# Patient Record
Sex: Male | Born: 1935 | Race: White | Hispanic: No | Marital: Married | Smoking: Former smoker
Health system: Southern US, Community
[De-identification: ages and names within clinical notes are randomized; demographics above are authoritative.]

## PROBLEM LIST (undated history)

## (undated) DIAGNOSIS — R918 Other nonspecific abnormal finding of lung field: Secondary | ICD-10-CM

## (undated) DIAGNOSIS — I4891 Unspecified atrial fibrillation: Secondary | ICD-10-CM

## (undated) DIAGNOSIS — J189 Pneumonia, unspecified organism: Secondary | ICD-10-CM

## (undated) DIAGNOSIS — I499 Cardiac arrhythmia, unspecified: Secondary | ICD-10-CM

## (undated) DIAGNOSIS — E785 Hyperlipidemia, unspecified: Secondary | ICD-10-CM

## (undated) DIAGNOSIS — I10 Essential (primary) hypertension: Secondary | ICD-10-CM

## (undated) DIAGNOSIS — C4490 Unspecified malignant neoplasm of skin, unspecified: Secondary | ICD-10-CM

## (undated) DIAGNOSIS — I350 Nonrheumatic aortic (valve) stenosis: Secondary | ICD-10-CM

## (undated) DIAGNOSIS — M4800 Spinal stenosis, site unspecified: Secondary | ICD-10-CM

## (undated) DIAGNOSIS — K219 Gastro-esophageal reflux disease without esophagitis: Secondary | ICD-10-CM

## (undated) DIAGNOSIS — I429 Cardiomyopathy, unspecified: Secondary | ICD-10-CM

## (undated) DIAGNOSIS — H3530 Unspecified macular degeneration: Secondary | ICD-10-CM

## (undated) DIAGNOSIS — N200 Calculus of kidney: Secondary | ICD-10-CM

## (undated) DIAGNOSIS — H269 Unspecified cataract: Secondary | ICD-10-CM

## (undated) DIAGNOSIS — I639 Cerebral infarction, unspecified: Secondary | ICD-10-CM

## (undated) DIAGNOSIS — D510 Vitamin B12 deficiency anemia due to intrinsic factor deficiency: Secondary | ICD-10-CM

## (undated) DIAGNOSIS — H9190 Unspecified hearing loss, unspecified ear: Secondary | ICD-10-CM

## (undated) DIAGNOSIS — C61 Malignant neoplasm of prostate: Secondary | ICD-10-CM

## (undated) HISTORY — DX: Unspecified atrial fibrillation: I48.91

## (undated) HISTORY — PX: CATARACT EXTRACTION: SUR2

## (undated) HISTORY — DX: Hyperlipidemia, unspecified: E78.5

## (undated) HISTORY — DX: Essential (primary) hypertension: I10

## (undated) HISTORY — DX: Vitamin B12 deficiency anemia due to intrinsic factor deficiency: D51.0

## (undated) HISTORY — DX: Unspecified macular degeneration: H35.30

## (undated) HISTORY — DX: Gastro-esophageal reflux disease without esophagitis: K21.9

## (undated) HISTORY — DX: Calculus of kidney: N20.0

## (undated) HISTORY — DX: Other nonspecific abnormal finding of lung field: R91.8

## (undated) HISTORY — DX: Malignant neoplasm of prostate: C61

## (undated) HISTORY — DX: Cardiomyopathy, unspecified: I42.9

## (undated) HISTORY — DX: Unspecified malignant neoplasm of skin, unspecified: C44.90

## (undated) HISTORY — DX: Spinal stenosis, site unspecified: M48.00

## (undated) HISTORY — DX: Cerebral infarction, unspecified: I63.9

## (undated) HISTORY — DX: Unspecified hearing loss, unspecified ear: H91.90

## (undated) HISTORY — DX: Nonrheumatic aortic (valve) stenosis: I35.0

## (undated) HISTORY — DX: Pneumonia, unspecified organism: J18.9

## (undated) HISTORY — PX: OTHER SURGICAL HISTORY: SHX169

## (undated) HISTORY — DX: Unspecified cataract: H26.9

---
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
## 1992-10-12 HISTORY — PX: CIRCUMCISION: SUR203

## 2001-07-04 ENCOUNTER — Encounter: Payer: Self-pay | Admitting: Ophthalmology

## 2001-07-04 ENCOUNTER — Encounter (INDEPENDENT_AMBULATORY_CARE_PROVIDER_SITE_OTHER): Payer: Self-pay | Admitting: *Deleted

## 2001-07-04 ENCOUNTER — Ambulatory Visit (HOSPITAL_COMMUNITY): Admission: RE | Admit: 2001-07-04 | Discharge: 2001-07-05 | Payer: Self-pay | Admitting: Ophthalmology

## 2001-10-12 DIAGNOSIS — I639 Cerebral infarction, unspecified: Secondary | ICD-10-CM

## 2001-10-12 HISTORY — DX: Cerebral infarction, unspecified: I63.9

## 2004-07-14 ENCOUNTER — Ambulatory Visit: Payer: Self-pay | Admitting: Gerontology

## 2004-10-07 ENCOUNTER — Emergency Department (HOSPITAL_COMMUNITY): Admission: EM | Admit: 2004-10-07 | Discharge: 2004-10-07 | Payer: Self-pay | Admitting: Emergency Medicine

## 2005-10-01 ENCOUNTER — Ambulatory Visit: Payer: Self-pay | Admitting: Gastroenterology

## 2005-10-12 DIAGNOSIS — C61 Malignant neoplasm of prostate: Secondary | ICD-10-CM

## 2005-10-12 HISTORY — DX: Malignant neoplasm of prostate: C61

## 2005-10-12 HISTORY — PX: TRANSURETHRAL RESECTION OF PROSTATE: SHX73

## 2006-09-16 ENCOUNTER — Ambulatory Visit: Payer: Self-pay | Admitting: Urology

## 2006-09-17 ENCOUNTER — Ambulatory Visit: Payer: Self-pay | Admitting: Urology

## 2006-12-15 ENCOUNTER — Ambulatory Visit: Payer: Self-pay

## 2007-06-08 ENCOUNTER — Ambulatory Visit: Payer: Self-pay | Admitting: Unknown Physician Specialty

## 2007-10-04 ENCOUNTER — Inpatient Hospital Stay: Payer: Self-pay | Admitting: Internal Medicine

## 2007-10-04 ENCOUNTER — Other Ambulatory Visit: Payer: Self-pay

## 2007-10-12 ENCOUNTER — Ambulatory Visit: Payer: Self-pay | Admitting: Physician Assistant

## 2008-02-10 ENCOUNTER — Ambulatory Visit: Payer: Self-pay | Admitting: Internal Medicine

## 2008-02-17 ENCOUNTER — Ambulatory Visit: Payer: Self-pay | Admitting: *Deleted

## 2008-02-17 ENCOUNTER — Ambulatory Visit (HOSPITAL_COMMUNITY): Admission: RE | Admit: 2008-02-17 | Discharge: 2008-02-17 | Payer: Self-pay | Admitting: Neurosurgery

## 2008-02-17 ENCOUNTER — Encounter (INDEPENDENT_AMBULATORY_CARE_PROVIDER_SITE_OTHER): Payer: Self-pay | Admitting: Neurosurgery

## 2008-11-14 ENCOUNTER — Ambulatory Visit: Payer: Self-pay | Admitting: Unknown Physician Specialty

## 2011-02-27 NOTE — Op Note (Signed)
[facility]  Patient:    Matthew Hicks, Matthew Hicks Visit Number: [id number] MRN: [id number]          Service Type: DSU Location: RCRM 2550 09 Attending Physician:  Ladona Ridgel Proc. Date: 07/04/01 Admit Date:  07/04/2001                             Operative Report  PREOPERATIVE DIAGNOSIS:  Subfoveal corneovascular membrane left eye.  POSTOPERATIVE DIAGNOSIS:  Subfoveal corneovascular membrane left eye.  PROCEDURE:  Pars plana vitrectomy, removal of subfoveal corneovascular membrane through small retinotomy, laser for retinal break, gas/fluid exchange with air left eye.  SURGEON:  Ladona Ridgel, M.D.  ASSISTANT:  Ulysees Barns, LPN  ANESTHESIA:  General endotracheal anesthesia.  ESTIMATED BLOOD LOSS:  Less than 1 cc.  COMPLICATIONS:  None.  DESCRIPTION OF PROCEDURE:  The patient was taken to the operating room and after the induction of general anesthesia, the left eye was prepped and draped in the usual sterile fashion.  Lid speculum was introduced and conjunctival peritomy was developed temporally and superonasally with relaxing incisions at 11 and 3.  Hemostasis obtained with razor cautery.  Sclerotomies were fashioned at 3.75 mm posterior to the limbus at 1:30, 10:30, and 4:30.  The superior sclerotomies were plugged on 4 mm infusion cannula secured at the 4:30 sclerotomy with a temporary suture of 7-0 Vicryl.  The tip was visually inspected and found to be in good position.  The Landers ring was then secured with a 7-0 Vicryl sutures at 3 and 9.  The plugs were removed and 30 degree prismatic lens applied to the surface of the eye.  A central core followed by a peripheral vitrectomy was performed.  There was posterior hyaloid separation. The magnifying flat lens was applied to the surface of the eye.  A large elevated subretinal fibrotic tissue could be seen coursing primarily through the nasal macula, but the edge was through  the center of the fovea. The presence of cortical vitreous on the retina was confirmed to be absent by the silicon-tip catheter.  The Thomas subretinal spatula was then used to make a small retinotomy superior to the membrane along the superotemporal arcade. The retina was then gently elevated off the membrane and the nasal edge of the membrane elevated up and freed from the underlying retinal pigment epithelium. The Thomas subretinal forcep was then passed through the small retinotomy engaging the membrane and the entire membrane was then freed from the underlying coroidal surface and pulled out through the retinotomy.  The membrane was quite large, had some filmy fibrinous attachments around the temporal aspect.  Minimal hemorrhage ensued and the membrane was then brought out the sclerotomy.  The membrane was sufficiently dense and large that it would not come through the sclerotomy.  The membrane was then reposited back on the surface of the eye and the vitrector used to trim the membrane down to a large fibrotic mass which was too large and fibrinous to be engaged by the vitrectomy port.  The membrane was then grasped with the serrated forceps and removed through the sclerotomy.  The silicon-tip catheter was used to aspirate the small amount of serosanguineous fluid that had collected underneath the central macula retina.  The instruments were removed from the eye and the holes were plugged.  The Landers lens ring removed.  Inspection with the indirect ophthalmoscope with scleral pressure revealed there to be some pigmentary  changes at approximately 7 oclock with a small retinal break in this area.  There was also some cystic appearing areas of the retina superonasally.  Both areas were surrounded by indirect laser photocoagulation. Attention was directed back into the eye where a gas/fluid exchange was performed with air removing small amounts of fluid through the retinotomy. The  superior sclerotomies were then closed with 7-0 Vicryl.  Infusion cannula was removed and peripherally suture secured.  The subconjunctiva was drawn up and reapproximated with interrupted running sutures of 6-0 plain gut.  The pressure was checked with Baraquer tonometer and found to lay at 21.  The subconjunctival space was irrigated with 0.75% Marcaine followed by subconjunctival injections of 100 mg of Ceftazidime and 20 mg of Decadron. The lid speculum was then removed and mixed antibiotic ointment applied to the surface of the eye.  Eye patch and shield was then placed over the patients left eye.  Following awakening from anesthesia, the patient left the operating room in stable condition. Attending Physician:  Ladona Ridgel DD:  07/04/01 TD:  07/04/01 Job: [id number]

## 2011-05-04 ENCOUNTER — Encounter (INDEPENDENT_AMBULATORY_CARE_PROVIDER_SITE_OTHER): Payer: Medicare Other | Admitting: Ophthalmology

## 2011-05-04 DIAGNOSIS — H3530 Unspecified macular degeneration: Secondary | ICD-10-CM

## 2011-05-04 DIAGNOSIS — H353290 Exudative age-related macular degeneration, unspecified eye, stage unspecified: Secondary | ICD-10-CM

## 2011-05-04 DIAGNOSIS — H43819 Vitreous degeneration, unspecified eye: Secondary | ICD-10-CM

## 2011-06-22 ENCOUNTER — Encounter (INDEPENDENT_AMBULATORY_CARE_PROVIDER_SITE_OTHER): Payer: Medicare Other | Admitting: Ophthalmology

## 2011-06-22 DIAGNOSIS — H353290 Exudative age-related macular degeneration, unspecified eye, stage unspecified: Secondary | ICD-10-CM

## 2011-06-22 DIAGNOSIS — H3530 Unspecified macular degeneration: Secondary | ICD-10-CM

## 2011-06-22 DIAGNOSIS — H43819 Vitreous degeneration, unspecified eye: Secondary | ICD-10-CM

## 2011-08-10 ENCOUNTER — Encounter (INDEPENDENT_AMBULATORY_CARE_PROVIDER_SITE_OTHER): Payer: Medicare Other | Admitting: Ophthalmology

## 2011-08-10 DIAGNOSIS — H353290 Exudative age-related macular degeneration, unspecified eye, stage unspecified: Secondary | ICD-10-CM

## 2011-08-10 DIAGNOSIS — H2510 Age-related nuclear cataract, unspecified eye: Secondary | ICD-10-CM

## 2011-08-10 DIAGNOSIS — H3530 Unspecified macular degeneration: Secondary | ICD-10-CM

## 2011-08-10 DIAGNOSIS — H43819 Vitreous degeneration, unspecified eye: Secondary | ICD-10-CM

## 2011-10-12 ENCOUNTER — Encounter (INDEPENDENT_AMBULATORY_CARE_PROVIDER_SITE_OTHER): Payer: Medicare Other | Admitting: Ophthalmology

## 2011-10-12 DIAGNOSIS — H3530 Unspecified macular degeneration: Secondary | ICD-10-CM

## 2011-10-12 DIAGNOSIS — H43819 Vitreous degeneration, unspecified eye: Secondary | ICD-10-CM

## 2011-10-12 DIAGNOSIS — H2510 Age-related nuclear cataract, unspecified eye: Secondary | ICD-10-CM

## 2011-10-12 DIAGNOSIS — H353290 Exudative age-related macular degeneration, unspecified eye, stage unspecified: Secondary | ICD-10-CM

## 2011-12-07 ENCOUNTER — Encounter (INDEPENDENT_AMBULATORY_CARE_PROVIDER_SITE_OTHER): Payer: Medicare Other | Admitting: Ophthalmology

## 2011-12-07 DIAGNOSIS — H353290 Exudative age-related macular degeneration, unspecified eye, stage unspecified: Secondary | ICD-10-CM

## 2011-12-07 DIAGNOSIS — H43819 Vitreous degeneration, unspecified eye: Secondary | ICD-10-CM

## 2011-12-07 DIAGNOSIS — H3530 Unspecified macular degeneration: Secondary | ICD-10-CM

## 2011-12-07 DIAGNOSIS — H2510 Age-related nuclear cataract, unspecified eye: Secondary | ICD-10-CM

## 2012-01-25 ENCOUNTER — Encounter (INDEPENDENT_AMBULATORY_CARE_PROVIDER_SITE_OTHER): Payer: Medicare Other | Admitting: Ophthalmology

## 2012-01-25 DIAGNOSIS — H2510 Age-related nuclear cataract, unspecified eye: Secondary | ICD-10-CM

## 2012-01-25 DIAGNOSIS — H353290 Exudative age-related macular degeneration, unspecified eye, stage unspecified: Secondary | ICD-10-CM

## 2012-01-25 DIAGNOSIS — H3530 Unspecified macular degeneration: Secondary | ICD-10-CM

## 2012-01-25 DIAGNOSIS — H43819 Vitreous degeneration, unspecified eye: Secondary | ICD-10-CM

## 2012-04-04 ENCOUNTER — Encounter (INDEPENDENT_AMBULATORY_CARE_PROVIDER_SITE_OTHER): Payer: Medicare Other | Admitting: Ophthalmology

## 2012-04-04 DIAGNOSIS — H43819 Vitreous degeneration, unspecified eye: Secondary | ICD-10-CM

## 2012-04-04 DIAGNOSIS — H2510 Age-related nuclear cataract, unspecified eye: Secondary | ICD-10-CM

## 2012-04-04 DIAGNOSIS — H353290 Exudative age-related macular degeneration, unspecified eye, stage unspecified: Secondary | ICD-10-CM

## 2012-04-04 DIAGNOSIS — H3530 Unspecified macular degeneration: Secondary | ICD-10-CM

## 2012-06-14 ENCOUNTER — Encounter (INDEPENDENT_AMBULATORY_CARE_PROVIDER_SITE_OTHER): Payer: Medicare Other | Admitting: Ophthalmology

## 2012-06-14 DIAGNOSIS — H3530 Unspecified macular degeneration: Secondary | ICD-10-CM

## 2012-06-14 DIAGNOSIS — H353290 Exudative age-related macular degeneration, unspecified eye, stage unspecified: Secondary | ICD-10-CM

## 2012-06-14 DIAGNOSIS — H43819 Vitreous degeneration, unspecified eye: Secondary | ICD-10-CM

## 2012-06-14 DIAGNOSIS — H2510 Age-related nuclear cataract, unspecified eye: Secondary | ICD-10-CM

## 2012-08-22 ENCOUNTER — Encounter (INDEPENDENT_AMBULATORY_CARE_PROVIDER_SITE_OTHER): Payer: Medicare Other | Admitting: Ophthalmology

## 2012-08-22 DIAGNOSIS — H353290 Exudative age-related macular degeneration, unspecified eye, stage unspecified: Secondary | ICD-10-CM

## 2012-08-22 DIAGNOSIS — H43819 Vitreous degeneration, unspecified eye: Secondary | ICD-10-CM

## 2012-08-22 DIAGNOSIS — H3530 Unspecified macular degeneration: Secondary | ICD-10-CM

## 2012-08-31 ENCOUNTER — Ambulatory Visit: Payer: Self-pay | Admitting: Gastroenterology

## 2012-11-14 ENCOUNTER — Encounter (INDEPENDENT_AMBULATORY_CARE_PROVIDER_SITE_OTHER): Payer: Medicare Other | Admitting: Ophthalmology

## 2012-11-14 DIAGNOSIS — H2510 Age-related nuclear cataract, unspecified eye: Secondary | ICD-10-CM

## 2012-11-14 DIAGNOSIS — H3530 Unspecified macular degeneration: Secondary | ICD-10-CM

## 2012-11-14 DIAGNOSIS — H353290 Exudative age-related macular degeneration, unspecified eye, stage unspecified: Secondary | ICD-10-CM

## 2012-11-14 DIAGNOSIS — H43819 Vitreous degeneration, unspecified eye: Secondary | ICD-10-CM

## 2013-02-13 ENCOUNTER — Encounter (INDEPENDENT_AMBULATORY_CARE_PROVIDER_SITE_OTHER): Payer: Medicare Other | Admitting: Ophthalmology

## 2013-02-13 DIAGNOSIS — H43819 Vitreous degeneration, unspecified eye: Secondary | ICD-10-CM

## 2013-02-13 DIAGNOSIS — H353290 Exudative age-related macular degeneration, unspecified eye, stage unspecified: Secondary | ICD-10-CM

## 2013-02-13 DIAGNOSIS — H2510 Age-related nuclear cataract, unspecified eye: Secondary | ICD-10-CM

## 2013-02-13 DIAGNOSIS — H3530 Unspecified macular degeneration: Secondary | ICD-10-CM

## 2013-06-26 ENCOUNTER — Encounter (INDEPENDENT_AMBULATORY_CARE_PROVIDER_SITE_OTHER): Payer: Medicare Other | Admitting: Ophthalmology

## 2013-06-26 DIAGNOSIS — H43819 Vitreous degeneration, unspecified eye: Secondary | ICD-10-CM

## 2013-06-26 DIAGNOSIS — H2510 Age-related nuclear cataract, unspecified eye: Secondary | ICD-10-CM

## 2013-06-26 DIAGNOSIS — H353290 Exudative age-related macular degeneration, unspecified eye, stage unspecified: Secondary | ICD-10-CM

## 2013-06-26 DIAGNOSIS — H3530 Unspecified macular degeneration: Secondary | ICD-10-CM

## 2013-08-16 ENCOUNTER — Ambulatory Visit: Payer: Self-pay | Admitting: Cardiology

## 2013-10-23 ENCOUNTER — Encounter (INDEPENDENT_AMBULATORY_CARE_PROVIDER_SITE_OTHER): Payer: Medicare Other | Admitting: Ophthalmology

## 2013-10-23 DIAGNOSIS — H43819 Vitreous degeneration, unspecified eye: Secondary | ICD-10-CM

## 2013-10-23 DIAGNOSIS — H3530 Unspecified macular degeneration: Secondary | ICD-10-CM

## 2013-10-23 DIAGNOSIS — H2510 Age-related nuclear cataract, unspecified eye: Secondary | ICD-10-CM

## 2013-10-23 DIAGNOSIS — H353290 Exudative age-related macular degeneration, unspecified eye, stage unspecified: Secondary | ICD-10-CM

## 2014-02-19 ENCOUNTER — Encounter (INDEPENDENT_AMBULATORY_CARE_PROVIDER_SITE_OTHER): Payer: Medicare Other | Admitting: Ophthalmology

## 2014-02-19 DIAGNOSIS — H3530 Unspecified macular degeneration: Secondary | ICD-10-CM

## 2014-02-19 DIAGNOSIS — H43819 Vitreous degeneration, unspecified eye: Secondary | ICD-10-CM

## 2014-02-19 DIAGNOSIS — H353290 Exudative age-related macular degeneration, unspecified eye, stage unspecified: Secondary | ICD-10-CM

## 2014-02-26 ENCOUNTER — Other Ambulatory Visit (INDEPENDENT_AMBULATORY_CARE_PROVIDER_SITE_OTHER): Payer: Medicare Other | Admitting: Ophthalmology

## 2014-02-26 DIAGNOSIS — H35059 Retinal neovascularization, unspecified, unspecified eye: Secondary | ICD-10-CM

## 2014-03-12 ENCOUNTER — Encounter (INDEPENDENT_AMBULATORY_CARE_PROVIDER_SITE_OTHER): Payer: Medicare Other | Admitting: Ophthalmology

## 2014-03-12 DIAGNOSIS — H35059 Retinal neovascularization, unspecified, unspecified eye: Secondary | ICD-10-CM

## 2014-04-08 DIAGNOSIS — E785 Hyperlipidemia, unspecified: Secondary | ICD-10-CM | POA: Insufficient documentation

## 2014-04-08 DIAGNOSIS — M4800 Spinal stenosis, site unspecified: Secondary | ICD-10-CM | POA: Insufficient documentation

## 2014-04-08 DIAGNOSIS — K219 Gastro-esophageal reflux disease without esophagitis: Secondary | ICD-10-CM | POA: Insufficient documentation

## 2014-04-08 DIAGNOSIS — I4891 Unspecified atrial fibrillation: Secondary | ICD-10-CM

## 2014-04-08 DIAGNOSIS — I429 Cardiomyopathy, unspecified: Secondary | ICD-10-CM | POA: Insufficient documentation

## 2014-04-08 DIAGNOSIS — I350 Nonrheumatic aortic (valve) stenosis: Secondary | ICD-10-CM | POA: Insufficient documentation

## 2014-04-08 DIAGNOSIS — I10 Essential (primary) hypertension: Secondary | ICD-10-CM | POA: Insufficient documentation

## 2014-04-08 DIAGNOSIS — D510 Vitamin B12 deficiency anemia due to intrinsic factor deficiency: Secondary | ICD-10-CM | POA: Insufficient documentation

## 2014-04-08 HISTORY — DX: Unspecified atrial fibrillation: I48.91

## 2014-04-09 ENCOUNTER — Encounter (INDEPENDENT_AMBULATORY_CARE_PROVIDER_SITE_OTHER): Payer: Medicare Other | Admitting: Ophthalmology

## 2014-04-09 DIAGNOSIS — H35059 Retinal neovascularization, unspecified, unspecified eye: Secondary | ICD-10-CM

## 2014-05-04 DIAGNOSIS — I10 Essential (primary) hypertension: Secondary | ICD-10-CM

## 2014-05-04 HISTORY — DX: Essential (primary) hypertension: I10

## 2014-06-27 ENCOUNTER — Encounter (INDEPENDENT_AMBULATORY_CARE_PROVIDER_SITE_OTHER): Payer: Medicare Other | Admitting: Ophthalmology

## 2014-06-27 DIAGNOSIS — H3530 Unspecified macular degeneration: Secondary | ICD-10-CM

## 2014-06-27 DIAGNOSIS — H353290 Exudative age-related macular degeneration, unspecified eye, stage unspecified: Secondary | ICD-10-CM

## 2014-06-27 DIAGNOSIS — H43819 Vitreous degeneration, unspecified eye: Secondary | ICD-10-CM

## 2014-07-02 ENCOUNTER — Encounter (INDEPENDENT_AMBULATORY_CARE_PROVIDER_SITE_OTHER): Payer: Medicare Other | Admitting: Ophthalmology

## 2014-07-02 DIAGNOSIS — I639 Cerebral infarction, unspecified: Secondary | ICD-10-CM | POA: Insufficient documentation

## 2014-08-01 ENCOUNTER — Ambulatory Visit: Payer: Self-pay | Admitting: Cardiology

## 2014-10-24 ENCOUNTER — Encounter (INDEPENDENT_AMBULATORY_CARE_PROVIDER_SITE_OTHER): Payer: Medicare Other | Admitting: Ophthalmology

## 2014-10-24 DIAGNOSIS — H3532 Exudative age-related macular degeneration: Secondary | ICD-10-CM

## 2014-10-24 DIAGNOSIS — H43813 Vitreous degeneration, bilateral: Secondary | ICD-10-CM

## 2014-10-24 DIAGNOSIS — H3531 Nonexudative age-related macular degeneration: Secondary | ICD-10-CM

## 2015-02-27 ENCOUNTER — Encounter (INDEPENDENT_AMBULATORY_CARE_PROVIDER_SITE_OTHER): Payer: Medicare Other | Admitting: Ophthalmology

## 2015-02-27 DIAGNOSIS — H3532 Exudative age-related macular degeneration: Secondary | ICD-10-CM | POA: Diagnosis not present

## 2015-02-27 DIAGNOSIS — H43813 Vitreous degeneration, bilateral: Secondary | ICD-10-CM | POA: Diagnosis not present

## 2015-02-27 DIAGNOSIS — H3531 Nonexudative age-related macular degeneration: Secondary | ICD-10-CM | POA: Diagnosis not present

## 2015-02-28 ENCOUNTER — Encounter (INDEPENDENT_AMBULATORY_CARE_PROVIDER_SITE_OTHER): Payer: Medicare Other | Admitting: Ophthalmology

## 2015-02-28 DIAGNOSIS — H2101 Hyphema, right eye: Secondary | ICD-10-CM

## 2015-02-28 DIAGNOSIS — H3532 Exudative age-related macular degeneration: Secondary | ICD-10-CM

## 2015-02-28 DIAGNOSIS — H43813 Vitreous degeneration, bilateral: Secondary | ICD-10-CM | POA: Diagnosis not present

## 2015-02-28 DIAGNOSIS — H3531 Nonexudative age-related macular degeneration: Secondary | ICD-10-CM | POA: Diagnosis not present

## 2015-03-07 ENCOUNTER — Ambulatory Visit (INDEPENDENT_AMBULATORY_CARE_PROVIDER_SITE_OTHER): Payer: Medicare Other | Admitting: Ophthalmology

## 2015-05-13 DIAGNOSIS — J189 Pneumonia, unspecified organism: Secondary | ICD-10-CM

## 2015-05-13 HISTORY — DX: Pneumonia, unspecified organism: J18.9

## 2015-06-06 ENCOUNTER — Other Ambulatory Visit: Payer: Self-pay | Admitting: Cardiology

## 2015-06-06 DIAGNOSIS — R9389 Abnormal findings on diagnostic imaging of other specified body structures: Secondary | ICD-10-CM

## 2015-06-10 ENCOUNTER — Ambulatory Visit
Admission: RE | Admit: 2015-06-10 | Discharge: 2015-06-10 | Disposition: A | Discharge status: Home / Self Care | Payer: Medicare Other | Source: Ambulatory Visit | Attending: Cardiology | Admitting: Cardiology

## 2015-06-10 DIAGNOSIS — J439 Emphysema, unspecified: Secondary | ICD-10-CM | POA: Insufficient documentation

## 2015-06-10 DIAGNOSIS — M4186 Other forms of scoliosis, lumbar region: Secondary | ICD-10-CM | POA: Insufficient documentation

## 2015-06-10 DIAGNOSIS — R918 Other nonspecific abnormal finding of lung field: Secondary | ICD-10-CM | POA: Insufficient documentation

## 2015-06-10 DIAGNOSIS — I517 Cardiomegaly: Secondary | ICD-10-CM | POA: Insufficient documentation

## 2015-06-10 DIAGNOSIS — M4854XA Collapsed vertebra, not elsewhere classified, thoracic region, initial encounter for fracture: Secondary | ICD-10-CM | POA: Insufficient documentation

## 2015-06-10 DIAGNOSIS — J90 Pleural effusion, not elsewhere classified: Secondary | ICD-10-CM | POA: Diagnosis not present

## 2015-06-10 DIAGNOSIS — E279 Disorder of adrenal gland, unspecified: Secondary | ICD-10-CM | POA: Diagnosis not present

## 2015-06-10 DIAGNOSIS — J9811 Atelectasis: Secondary | ICD-10-CM | POA: Insufficient documentation

## 2015-06-10 DIAGNOSIS — I7090 Unspecified atherosclerosis: Secondary | ICD-10-CM | POA: Insufficient documentation

## 2015-06-10 DIAGNOSIS — R9389 Abnormal findings on diagnostic imaging of other specified body structures: Secondary | ICD-10-CM

## 2015-06-10 DIAGNOSIS — R938 Abnormal findings on diagnostic imaging of other specified body structures: Secondary | ICD-10-CM | POA: Diagnosis present

## 2015-06-10 MED ORDER — IOHEXOL 300 MG/ML  SOLN
75.0000 mL | Freq: Once | INTRAMUSCULAR | Status: AC | PRN
  Administered 2015-06-10: 75 mL via INTRAVENOUS

## 2015-06-21 ENCOUNTER — Encounter: Payer: Self-pay | Admitting: Cardiothoracic Surgery

## 2015-06-21 ENCOUNTER — Inpatient Hospital Stay: Payer: Medicare Other | Attending: Cardiothoracic Surgery | Admitting: Cardiothoracic Surgery

## 2015-06-21 VITALS — BP 178/79 | HR 89 | Temp 98.0°F | Resp 18 | Ht 64.0 in | Wt 167.6 lb

## 2015-06-21 DIAGNOSIS — J9811 Atelectasis: Secondary | ICD-10-CM | POA: Insufficient documentation

## 2015-06-21 DIAGNOSIS — R918 Other nonspecific abnormal finding of lung field: Secondary | ICD-10-CM | POA: Diagnosis not present

## 2015-06-21 NOTE — Progress Notes (Signed)
Patient here for new consult to see Dr. Genevive Bi. Strong family h/o cancer: colon, "gyn male cancers", and the patient has a personal history of prostate cancer. Patient states that he presented to Dr. Ubaldo Glassing with persistent shortness of breath. He was treated for pneumonia with levaquin. A CT scan of the chest was ordered by Dr. Ubaldo Glassing which revealed an abnormal ct scan of the chest.

## 2015-06-21 NOTE — Progress Notes (Signed)
Patient ID: Matthew Form., male   DOB: 09/28/1925, 81 y.o.   MRN: [id number]  Chief Complaint  Patient presents with  . Lung Mass    new evaluation. Ref Dr. Ubaldo Glassing    Referred By Dr. Jola Baptist Reason for Referral right lung mass  HPI Location, Quality, Duration, Severity, Timing, Context, Modifying Factors, Associated Signs and Symptoms.  Matthew Hicks. is a 81 y.o. male.  This patient is accompanied today by his daughter and grandson. Much of the history is obtained from the daughter as the patient is hard of hearing. The patient presented to Dr. Jola Baptist his primary cardiologist with increasing shortness of breath and cough. A chest x-ray was performed revealing a possible mass in the right lung along with a small pleural effusion. A CT scan the chest confirmed the presence of a right upper lobe 1 cm nodule as well as a larger right lower lobe mass which was most consistent with rounded atelectasis. The patient states that he short of breath at all times. He can do only minimal to moderate amounts of stress without being significantly short of breath. He also has had a history of a stroke and is currently taking Xarelto for his anticoagulation. He comes in today to discuss the workup and management of his 2 right lung lesions.   Past Medical History  Diagnosis Date  . CVA (cerebral infarction) 2003  . Hypertension 05/04/2014  . A-fib 04/08/2014  . Hyperlipidemia   . Pernicious anemia   . GERD (gastroesophageal reflux disease)   . Spinal stenosis   . Aortic stenosis   . Cardiomyopathy   . Cataract   . Pneumonia 05/2015  . Macular degeneration   . Hearing loss   . Prostate cancer 2007    performed by Dr. Eliberto Ivory  . Nephrolithiasis     Past Surgical History  Procedure Laterality Date  . Transurethral resection of prostate  2007    prostate cancer  . Cystoscopy  1946  . Circumcision  1994  . Spinal cyst removal    . Cataract extraction    . Laparoscopic prostectomy       Family History  Problem Relation Age of Onset  . Alzheimer's disease Brother   . Stroke Father     heart disease  . Pneumonia Father   . Colon cancer Daughter 39  . Cancer - Other Paternal Aunt 50    "GYN cancer"  . Cancer - Other Paternal Grandmother 15    "GYN cancer"    Social History Social History  Substance Use Topics  . Smoking status: Former Smoker -- 1.00 packs/day for 40 years    Types: Cigarettes  . Smokeless tobacco: Never Used     Comment: does not smoke now  . Alcohol Use: No    Allergies  Allergen Reactions  . Amoxicillin-Pot Clavulanate Swelling    angioedema  . Fesoterodine Fumarate Er Rash  . Oxycodone-Acetaminophen Rash  . Sulfa Antibiotics Rash    Current Outpatient Prescriptions  Medication Sig Dispense Refill  . atorvastatin (LIPITOR) 20 MG tablet Take 20 mg by mouth daily.    . Cyanocobalamin (B-12) 5000 MCG CAPS Take 1 tablet by mouth daily.    . furosemide (LASIX) 20 MG tablet Take 20 mg by mouth daily.    . Multiple Vitamins-Minerals (ICAPS) CAPS Take 1 tablet by mouth daily.    . SF 1.1 % GEL dental gel Take 1 application by mouth daily. Paste for partial dentures    .  XARELTO 10 MG TABS tablet Take 10 mg by mouth daily.     No current facility-administered medications for this visit.      Review of Systems A complete review of systems was asked and was negative except for the following positive findings difficulty with his vision, difficulty with hearing, irregular heartbeat, frequent cough, shortness of breath, joint pain, frequent urination, urinary incontinence, easy bruising.  Blood pressure 178/79, pulse 89, temperature 98 F (36.7 C), resp. rate 18, height 5\' 4"  (1.626 m), weight 167 lb 8.8 oz (76 kg), SpO2 100 %.  Physical Exam CONSTITUTIONAL:  Pleasant, well-developed, well-nourished, and in no acute distress. EYES: Pupils equal and reactive to light, Sclera non-icteric EARS, NOSE, MOUTH AND THROAT:  The oropharynx was  clear.  Dentition is good repair.  Oral mucosa pink and moist. LYMPH NODES:  Lymph nodes in the neck and axillae were normal RESPIRATORY:  Lungs were clear.  Normal respiratory effort without pathologic use of accessory muscles of respiration CARDIOVASCULAR: Heart was irregular with a grade 3/6 systolic murmur..  There were no carotid bruits on the right but there was a soft bruit on the left.Marland Kitchen GI: The abdomen was soft, nontender, and nondistended. There were no palpable masses. There was no hepatosplenomegaly. There were normal bowel sounds in all quadrants. GU:  Rectal deferred.   MUSCULOSKELETAL:  Normal muscle strength and tone.  No clubbing or cyanosis.     SKIN:  There were no pathologic skin lesions.  There were no nodules on palpation. NEUROLOGIC:  Sensation is normal.  Cranial nerves are grossly intact. PSYCH:  Oriented to person, place and time.  Mood and affect are normal.  Data Reviewed I have independently reviewed the patient's CT scan and chest x-ray  I have personally reviewed the patient's imaging, laboratory findings and medical records.    Assessment    This patient has 2 right lung lesions. The upper lobe lesion is slightly more concerning for malignancy than the lower lobe lesion which I believe represents rounded atelectasis. I explained to the patient and his daughter that the lesion in the upper lobe was concerning and that one could consider doing an navigational bronchoscopy for diagnosis. However believe that a PET scan would also be of some benefit and we will go ahead and set him up for that. I had a long discussion with the daughter today regarding what options might be available to him if he indeed has a malignancy. I do think that the upper lobe lesion could be managed with radiation therapy. Obviously the patient and his advanced age is not a candidate for surgery.    Plan    After extensive discussion the family has agreed to pursue with a PET scan and we will  see him back next week and further follow-up.       Nestor Lewandowsky, MD 06/21/2015, 9:44 AM

## 2015-06-25 ENCOUNTER — Ambulatory Visit
Admission: RE | Admit: 2015-06-25 | Discharge: 2015-06-25 | Disposition: A | Discharge status: Home / Self Care | Payer: Medicare Other | Source: Ambulatory Visit | Attending: Cardiothoracic Surgery | Admitting: Cardiothoracic Surgery

## 2015-06-25 DIAGNOSIS — J90 Pleural effusion, not elsewhere classified: Secondary | ICD-10-CM | POA: Insufficient documentation

## 2015-06-25 DIAGNOSIS — R918 Other nonspecific abnormal finding of lung field: Secondary | ICD-10-CM | POA: Insufficient documentation

## 2015-06-25 DIAGNOSIS — J439 Emphysema, unspecified: Secondary | ICD-10-CM | POA: Diagnosis not present

## 2015-06-25 DIAGNOSIS — J9811 Atelectasis: Secondary | ICD-10-CM | POA: Insufficient documentation

## 2015-06-25 DIAGNOSIS — Z79899 Other long term (current) drug therapy: Secondary | ICD-10-CM | POA: Insufficient documentation

## 2015-06-25 DIAGNOSIS — I700 Atherosclerosis of aorta: Secondary | ICD-10-CM | POA: Diagnosis not present

## 2015-06-25 LAB — GLUCOSE, CAPILLARY: GLUCOSE-CAPILLARY: 87 mg/dL (ref 65–99)

## 2015-06-25 MED ORDER — FLUDEOXYGLUCOSE F - 18 (FDG) INJECTION
13.2000 | Freq: Once | INTRAVENOUS | Status: DC | PRN
  Administered 2015-06-25: 13.27 via INTRAVENOUS
  Filled 2015-06-25: qty 13.2

## 2015-06-27 ENCOUNTER — Inpatient Hospital Stay (HOSPITAL_BASED_OUTPATIENT_CLINIC_OR_DEPARTMENT_OTHER): Payer: Medicare Other | Admitting: Cardiothoracic Surgery

## 2015-06-27 ENCOUNTER — Inpatient Hospital Stay: Payer: Medicare Other | Admitting: Cardiothoracic Surgery

## 2015-06-27 VITALS — BP 157/95 | HR 73 | Temp 98.0°F | Ht 64.0 in | Wt 171.1 lb

## 2015-06-27 DIAGNOSIS — R911 Solitary pulmonary nodule: Secondary | ICD-10-CM | POA: Diagnosis not present

## 2015-06-27 DIAGNOSIS — R918 Other nonspecific abnormal finding of lung field: Secondary | ICD-10-CM | POA: Diagnosis not present

## 2015-06-27 NOTE — Progress Notes (Signed)
Patient here for PET results. No complaints today

## 2015-06-27 NOTE — Progress Notes (Signed)
Matthew Hicks Inpatient Post-Op Note  Patient ID: Matthew Vanatta., male   DOB: 11/22/1924, 81 y.o.   MRN: [id number]  HISTORY: He returns today in follow-up. He continues to be significantly short of breath. He did have his PET scan done and there is been no intercurrent problems since then. He specifically denied any fevers or chills.   Filed Vitals:   06/27/15 1500  BP: 157/95  Pulse: 73  Temp: 98 F (36.7 C)     EXAM: Resp: Lungs are clear bilaterally.  No respiratory distress, normal effort. Heart:  Atrial fibrillation with a systolic ejection murmur Abd:  Abdomen is soft, non distended and non tender. No masses are palpable.  There is no rebound and no guarding.  Neurological: Alert and oriented to person, place, and time. Coordination normal.  Skin: Skin is warm and dry. No rash noted. No diaphoretic. No erythema. No pallor.  Psychiatric: Normal mood and affect. Normal behavior. Judgment and thought content normal.    ASSESSMENT: I have independently reviewed the patient's CT scan. There is a right upper lobe nodule which does show some faint activity on PET scanning. The rounded mass in the right lower lobe was consistent with rounded atelectasis. I had a long discussion today with the patient and his family regarding the options at this point. I'm more concerned about the right upper lobe nodule then I am the right lower lobe area. Given his advanced age and significant comorbid conditions I do not believe that he would be a surgical candidate. I think at this point in time the best option be to continue surveillance and I would like to bring him back in 3 months with another CT scan.   PLAN:   I have discussed the indications and risks of the various options at this point. After long discussion with the family they've elected to come back in 3 months with another CT and further follow-up.    Nestor Lewandowsky, MD

## 2015-06-28 ENCOUNTER — Other Ambulatory Visit: Payer: Self-pay | Admitting: Cardiothoracic Surgery

## 2015-06-28 DIAGNOSIS — R918 Other nonspecific abnormal finding of lung field: Secondary | ICD-10-CM

## 2015-07-03 ENCOUNTER — Encounter (INDEPENDENT_AMBULATORY_CARE_PROVIDER_SITE_OTHER): Payer: Medicare Other | Admitting: Ophthalmology

## 2015-07-03 DIAGNOSIS — H3531 Nonexudative age-related macular degeneration: Secondary | ICD-10-CM

## 2015-07-03 DIAGNOSIS — H43813 Vitreous degeneration, bilateral: Secondary | ICD-10-CM | POA: Diagnosis not present

## 2015-07-03 DIAGNOSIS — H3532 Exudative age-related macular degeneration: Secondary | ICD-10-CM | POA: Diagnosis not present

## 2015-07-03 DIAGNOSIS — H35033 Hypertensive retinopathy, bilateral: Secondary | ICD-10-CM | POA: Diagnosis not present

## 2015-07-03 DIAGNOSIS — I10 Essential (primary) hypertension: Secondary | ICD-10-CM | POA: Diagnosis not present

## 2015-08-07 ENCOUNTER — Other Ambulatory Visit (INDEPENDENT_AMBULATORY_CARE_PROVIDER_SITE_OTHER): Payer: Medicare Other | Admitting: Ophthalmology

## 2015-08-07 DIAGNOSIS — H26492 Other secondary cataract, left eye: Secondary | ICD-10-CM | POA: Diagnosis not present

## 2015-09-02 ENCOUNTER — Ambulatory Visit (INDEPENDENT_AMBULATORY_CARE_PROVIDER_SITE_OTHER): Payer: Medicare Other | Admitting: Ophthalmology

## 2015-09-02 DIAGNOSIS — H2702 Aphakia, left eye: Secondary | ICD-10-CM

## 2015-09-26 ENCOUNTER — Ambulatory Visit
Admission: RE | Admit: 2015-09-26 | Discharge: 2015-09-26 | Disposition: A | Discharge status: Home / Self Care | Payer: Medicare Other | Source: Ambulatory Visit | Attending: Cardiothoracic Surgery | Admitting: Cardiothoracic Surgery

## 2015-09-26 ENCOUNTER — Inpatient Hospital Stay: Payer: Medicare Other | Attending: Cardiothoracic Surgery | Admitting: Cardiothoracic Surgery

## 2015-09-26 VITALS — BP 133/85 | HR 75 | Temp 98.0°F | Ht 64.0 in | Wt 169.5 lb

## 2015-09-26 DIAGNOSIS — J9811 Atelectasis: Secondary | ICD-10-CM | POA: Diagnosis not present

## 2015-09-26 DIAGNOSIS — R0602 Shortness of breath: Secondary | ICD-10-CM | POA: Insufficient documentation

## 2015-09-26 DIAGNOSIS — R918 Other nonspecific abnormal finding of lung field: Secondary | ICD-10-CM | POA: Diagnosis present

## 2015-09-26 DIAGNOSIS — J90 Pleural effusion, not elsewhere classified: Secondary | ICD-10-CM | POA: Diagnosis not present

## 2015-09-26 DIAGNOSIS — I700 Atherosclerosis of aorta: Secondary | ICD-10-CM | POA: Insufficient documentation

## 2015-09-26 DIAGNOSIS — I2510 Atherosclerotic heart disease of native coronary artery without angina pectoris: Secondary | ICD-10-CM | POA: Insufficient documentation

## 2015-09-26 NOTE — Progress Notes (Signed)
Matthew Hicks Inpatient Post-Op Note  Patient ID: Matthew Ki., male   DOB: [birth year], 81 y.o.   MRN: [id number]  HISTORY: He comes in today with continued shortness of breath. He still thinks this is coming from his heart. He is able to walk the length of the house before he has to stop. This is not new for him   Filed Vitals:   09/26/15 1104  BP: 133/85  Pulse: 75  Temp: 98 F (36.7 C)     EXAM: Resp: Lungs are clear bilaterally.  No respiratory distress, normal effort. Heart:  Irregularly irregular with a loud systolic murmur. Abd:  Abdomen is soft, non distended and non tender. No masses are palpable.  There is no rebound and no guarding.  Neurological: Alert and oriented to person, place, and time. Coordination normal.  Skin: Skin is warm and dry. No rash noted. No diaphoretic. No erythema. No pallor.  Psychiatric: Normal mood and affect. Normal behavior. Judgment and thought content normal.    ASSESSMENT: I have independently reviewed the patient's chest CT in compared it to the one from 3 months ago. There is no change.   PLAN:   We'll bring him back again in 3 months time with another CT scan. He is in agreement.    Nestor Lewandowsky, MD

## 2015-11-07 ENCOUNTER — Encounter (INDEPENDENT_AMBULATORY_CARE_PROVIDER_SITE_OTHER): Payer: Medicare Other | Admitting: Ophthalmology

## 2015-11-07 DIAGNOSIS — H353124 Nonexudative age-related macular degeneration, left eye, advanced atrophic with subfoveal involvement: Secondary | ICD-10-CM

## 2015-11-07 DIAGNOSIS — H43813 Vitreous degeneration, bilateral: Secondary | ICD-10-CM | POA: Diagnosis not present

## 2015-11-07 DIAGNOSIS — H353211 Exudative age-related macular degeneration, right eye, with active choroidal neovascularization: Secondary | ICD-10-CM | POA: Diagnosis not present

## 2015-11-14 DIAGNOSIS — Z8673 Personal history of transient ischemic attack (TIA), and cerebral infarction without residual deficits: Secondary | ICD-10-CM | POA: Insufficient documentation

## 2015-12-26 ENCOUNTER — Ambulatory Visit: Payer: Medicare Other | Admitting: Cardiothoracic Surgery

## 2015-12-26 ENCOUNTER — Ambulatory Visit: Payer: Medicare Other

## 2016-01-02 ENCOUNTER — Inpatient Hospital Stay: Payer: Medicare Other | Attending: Cardiothoracic Surgery | Admitting: Cardiothoracic Surgery

## 2016-01-02 ENCOUNTER — Ambulatory Visit
Admission: RE | Admit: 2016-01-02 | Discharge: 2016-01-02 | Disposition: A | Discharge status: Home / Self Care | Payer: Medicare Other | Source: Ambulatory Visit | Attending: Cardiothoracic Surgery | Admitting: Cardiothoracic Surgery

## 2016-01-02 VITALS — BP 162/81 | HR 73 | Temp 96.2°F | Resp 18 | Ht 64.0 in | Wt 168.4 lb

## 2016-01-02 DIAGNOSIS — E279 Disorder of adrenal gland, unspecified: Secondary | ICD-10-CM | POA: Diagnosis not present

## 2016-01-02 DIAGNOSIS — J90 Pleural effusion, not elsewhere classified: Secondary | ICD-10-CM | POA: Diagnosis not present

## 2016-01-02 DIAGNOSIS — I2510 Atherosclerotic heart disease of native coronary artery without angina pectoris: Secondary | ICD-10-CM | POA: Diagnosis not present

## 2016-01-02 DIAGNOSIS — R590 Localized enlarged lymph nodes: Secondary | ICD-10-CM | POA: Insufficient documentation

## 2016-01-02 DIAGNOSIS — N200 Calculus of kidney: Secondary | ICD-10-CM | POA: Insufficient documentation

## 2016-01-02 DIAGNOSIS — I700 Atherosclerosis of aorta: Secondary | ICD-10-CM | POA: Insufficient documentation

## 2016-01-02 DIAGNOSIS — R918 Other nonspecific abnormal finding of lung field: Secondary | ICD-10-CM | POA: Insufficient documentation

## 2016-01-02 NOTE — Progress Notes (Signed)
Matthew Hicks Inpatient Post-Op Note  Patient ID: Matthew Burnet., male   DOB: January 14, 1925, 82 y.o.   MRN: [id number]  HISTORY: This patient returns today in follow-up. He complains of being short of breath with minimal activities. He believes that this is related to his irregular heartbeat. He states that overall he is feeling about the same.   Filed Vitals:   01/02/16 0832  BP: 162/81  Pulse: 73  Temp: 96.2 F (35.7 C)  Resp: 18     EXAM: Resp: Lungs are clear bilaterally.  No respiratory distress, normal effort. Heart:  IrRegular with 2/6 systolic murmur murmurs Neurological: Alert and oriented to person, place, and time. Coordination normal.  Skin: Skin is warm and dry. No rash noted. No diaphoretic. No erythema. No pallor.  Psychiatric: Normal mood and affect. Normal behavior. Judgment and thought content normal.    ASSESSMENT: I have independently reviewed the patient's CT scan which she had made today. I see no changes in the CT scan. The lesion in the right upper lobe and right lower lobe are grossly unchanged. The right adrenal lesion is also unchanged.   PLAN:   I would like to see the patient back again in 6 months time with another CT scan. Also ask one of our oncologist to review the scan concerning his right adrenal gland.    Nestor Lewandowsky, MD

## 2016-01-02 NOTE — Progress Notes (Signed)
No changes since last visit.  Pt reports just being tired all the time and sob on exertion and rest.

## 2016-02-26 ENCOUNTER — Encounter (INDEPENDENT_AMBULATORY_CARE_PROVIDER_SITE_OTHER): Payer: Medicare Other | Admitting: Ophthalmology

## 2016-02-26 DIAGNOSIS — H353211 Exudative age-related macular degeneration, right eye, with active choroidal neovascularization: Secondary | ICD-10-CM

## 2016-02-26 DIAGNOSIS — H353124 Nonexudative age-related macular degeneration, left eye, advanced atrophic with subfoveal involvement: Secondary | ICD-10-CM | POA: Diagnosis not present

## 2016-02-26 DIAGNOSIS — H43813 Vitreous degeneration, bilateral: Secondary | ICD-10-CM | POA: Diagnosis not present

## 2016-05-13 DIAGNOSIS — E7800 Pure hypercholesterolemia, unspecified: Secondary | ICD-10-CM | POA: Insufficient documentation

## 2016-06-24 ENCOUNTER — Encounter (INDEPENDENT_AMBULATORY_CARE_PROVIDER_SITE_OTHER): Payer: Medicare Other | Admitting: Ophthalmology

## 2016-06-24 DIAGNOSIS — H43813 Vitreous degeneration, bilateral: Secondary | ICD-10-CM

## 2016-06-24 DIAGNOSIS — H26491 Other secondary cataract, right eye: Secondary | ICD-10-CM | POA: Diagnosis not present

## 2016-06-24 DIAGNOSIS — H353231 Exudative age-related macular degeneration, bilateral, with active choroidal neovascularization: Secondary | ICD-10-CM | POA: Diagnosis not present

## 2016-07-02 ENCOUNTER — Ambulatory Visit
Admission: RE | Admit: 2016-07-02 | Discharge: 2016-07-02 | Disposition: A | Discharge status: Home / Self Care | Payer: Medicare Other | Source: Ambulatory Visit | Attending: Cardiothoracic Surgery | Admitting: Cardiothoracic Surgery

## 2016-07-02 ENCOUNTER — Ambulatory Visit: Payer: Medicare Other | Admitting: Cardiothoracic Surgery

## 2016-07-02 DIAGNOSIS — R918 Other nonspecific abnormal finding of lung field: Secondary | ICD-10-CM

## 2016-07-10 ENCOUNTER — Encounter: Payer: Self-pay | Admitting: Cardiothoracic Surgery

## 2016-07-10 ENCOUNTER — Ambulatory Visit (INDEPENDENT_AMBULATORY_CARE_PROVIDER_SITE_OTHER): Payer: Medicare Other | Admitting: Cardiothoracic Surgery

## 2016-07-10 VITALS — BP 149/77 | HR 80 | Temp 97.5°F | Wt 164.0 lb

## 2016-07-10 DIAGNOSIS — R918 Other nonspecific abnormal finding of lung field: Secondary | ICD-10-CM

## 2016-07-10 NOTE — Progress Notes (Signed)
  Patient ID: Matthew Hicks., male   DOB: January 05, 1925, 82 y.o.   MRN: [id number]  HISTORY: This patient returns today in follow-up. He continues to be short of breath. He is short of breath even at rest without any exertion whatsoever. He did have a CT scan performed. I did review that with him and his daughter.   Vitals:   07/10/16 0815  BP: (!) 149/77  Pulse: 80  Temp: 97.5 F (36.4 C)     EXAM:    Resp: Lungs are clear bilaterally.  No respiratory distress, normal effort. Heart:  Atrial fibrillation with loud 4/6 systolic murmur Abd:  Abdomen is soft, non distended and non tender. No masses are palpable.  There is no rebound and no guarding.  Neurological: Alert and oriented to person, place, and time. Coordination normal.  Skin: Skin is warm and dry. No rash noted. No diaphoretic. No erythema. No pallor.  Psychiatric: Normal mood and affect. Normal behavior. Judgment and thought content normal.    ASSESSMENT: I have independently reviewed the CT scan performed last week. I've compared it to his prior scans. I see no change in the bilateral pulmonary nodules. There is some rounded atelectasis in the right lower lobe as well as a nodular opacity in the right upper lobe. These are unchanged over the last year. In addition the adrenal mass is unchanged   PLAN:   I explained to the patient that the CT scan suggested a benign etiology. We will see him back again in one year. He would like continued follow-up and we will obtain a CT scan without contrast at that time.    Nestor Lewandowsky, MD

## 2016-07-10 NOTE — Patient Instructions (Signed)
We will need to see you back in one year and repeat your CT Scan. We will contact you once we schedule this in a year.

## 2016-07-22 ENCOUNTER — Other Ambulatory Visit (INDEPENDENT_AMBULATORY_CARE_PROVIDER_SITE_OTHER): Payer: Medicare Other | Admitting: Ophthalmology

## 2016-07-22 DIAGNOSIS — H26491 Other secondary cataract, right eye: Secondary | ICD-10-CM

## 2016-08-12 ENCOUNTER — Encounter (INDEPENDENT_AMBULATORY_CARE_PROVIDER_SITE_OTHER): Payer: Medicare Other | Admitting: Ophthalmology

## 2016-08-12 DIAGNOSIS — H2701 Aphakia, right eye: Secondary | ICD-10-CM

## 2016-10-21 ENCOUNTER — Encounter (INDEPENDENT_AMBULATORY_CARE_PROVIDER_SITE_OTHER): Payer: Medicare Other | Admitting: Ophthalmology

## 2016-10-21 DIAGNOSIS — H353231 Exudative age-related macular degeneration, bilateral, with active choroidal neovascularization: Secondary | ICD-10-CM | POA: Diagnosis not present

## 2016-10-21 DIAGNOSIS — H43813 Vitreous degeneration, bilateral: Secondary | ICD-10-CM

## 2016-12-16 ENCOUNTER — Encounter (INDEPENDENT_AMBULATORY_CARE_PROVIDER_SITE_OTHER): Payer: Medicare Other | Admitting: Ophthalmology

## 2016-12-16 DIAGNOSIS — H43813 Vitreous degeneration, bilateral: Secondary | ICD-10-CM

## 2016-12-16 DIAGNOSIS — H353231 Exudative age-related macular degeneration, bilateral, with active choroidal neovascularization: Secondary | ICD-10-CM | POA: Diagnosis not present

## 2017-01-07 DIAGNOSIS — H8113 Benign paroxysmal vertigo, bilateral: Secondary | ICD-10-CM | POA: Insufficient documentation

## 2017-02-17 ENCOUNTER — Encounter (INDEPENDENT_AMBULATORY_CARE_PROVIDER_SITE_OTHER): Payer: Medicare Other | Admitting: Ophthalmology

## 2017-02-17 DIAGNOSIS — H353231 Exudative age-related macular degeneration, bilateral, with active choroidal neovascularization: Secondary | ICD-10-CM

## 2017-02-17 DIAGNOSIS — H43813 Vitreous degeneration, bilateral: Secondary | ICD-10-CM | POA: Diagnosis not present

## 2017-04-21 ENCOUNTER — Encounter (INDEPENDENT_AMBULATORY_CARE_PROVIDER_SITE_OTHER): Payer: Medicare Other | Admitting: Ophthalmology

## 2017-04-21 DIAGNOSIS — H43813 Vitreous degeneration, bilateral: Secondary | ICD-10-CM

## 2017-04-21 DIAGNOSIS — H353231 Exudative age-related macular degeneration, bilateral, with active choroidal neovascularization: Secondary | ICD-10-CM

## 2017-05-23 ENCOUNTER — Emergency Department: Payer: Medicare Other

## 2017-05-23 ENCOUNTER — Encounter
Admission: EM | Disposition: A | Discharge status: Home / Self Care | Payer: Self-pay | Source: Home / Self Care | Attending: Internal Medicine

## 2017-05-23 ENCOUNTER — Inpatient Hospital Stay: Payer: Medicare Other | Admitting: Anesthesiology

## 2017-05-23 ENCOUNTER — Inpatient Hospital Stay
Admission: EM | Admit: 2017-05-23 | Discharge: 2017-05-27 | DRG: 690 | Disposition: A | Discharge status: Home / Self Care | Payer: Medicare Other | Attending: Internal Medicine | Admitting: Internal Medicine

## 2017-05-23 ENCOUNTER — Encounter: Payer: Self-pay | Admitting: Radiology

## 2017-05-23 DIAGNOSIS — Z823 Family history of stroke: Secondary | ICD-10-CM

## 2017-05-23 DIAGNOSIS — Z800 Family history of malignant neoplasm of digestive organs: Secondary | ICD-10-CM

## 2017-05-23 DIAGNOSIS — N136 Pyonephrosis: Principal | ICD-10-CM | POA: Diagnosis present

## 2017-05-23 DIAGNOSIS — N132 Hydronephrosis with renal and ureteral calculous obstruction: Secondary | ICD-10-CM | POA: Diagnosis not present

## 2017-05-23 DIAGNOSIS — Z888 Allergy status to other drugs, medicaments and biological substances status: Secondary | ICD-10-CM

## 2017-05-23 DIAGNOSIS — Z923 Personal history of irradiation: Secondary | ICD-10-CM

## 2017-05-23 DIAGNOSIS — Z9079 Acquired absence of other genital organ(s): Secondary | ICD-10-CM | POA: Diagnosis not present

## 2017-05-23 DIAGNOSIS — Z882 Allergy status to sulfonamides status: Secondary | ICD-10-CM | POA: Diagnosis not present

## 2017-05-23 DIAGNOSIS — I10 Essential (primary) hypertension: Secondary | ICD-10-CM | POA: Diagnosis present

## 2017-05-23 DIAGNOSIS — Z9849 Cataract extraction status, unspecified eye: Secondary | ICD-10-CM | POA: Diagnosis not present

## 2017-05-23 DIAGNOSIS — Z820 Family history of epilepsy and other diseases of the nervous system: Secondary | ICD-10-CM

## 2017-05-23 DIAGNOSIS — N179 Acute kidney failure, unspecified: Secondary | ICD-10-CM | POA: Diagnosis present

## 2017-05-23 DIAGNOSIS — Z87891 Personal history of nicotine dependence: Secondary | ICD-10-CM

## 2017-05-23 DIAGNOSIS — D497 Neoplasm of unspecified behavior of endocrine glands and other parts of nervous system: Secondary | ICD-10-CM | POA: Diagnosis present

## 2017-05-23 DIAGNOSIS — K219 Gastro-esophageal reflux disease without esophagitis: Secondary | ICD-10-CM | POA: Diagnosis present

## 2017-05-23 DIAGNOSIS — I482 Chronic atrial fibrillation: Secondary | ICD-10-CM | POA: Diagnosis present

## 2017-05-23 DIAGNOSIS — Z85828 Personal history of other malignant neoplasm of skin: Secondary | ICD-10-CM | POA: Diagnosis not present

## 2017-05-23 DIAGNOSIS — Z79899 Other long term (current) drug therapy: Secondary | ICD-10-CM | POA: Diagnosis not present

## 2017-05-23 DIAGNOSIS — Z880 Allergy status to penicillin: Secondary | ICD-10-CM | POA: Diagnosis not present

## 2017-05-23 DIAGNOSIS — I429 Cardiomyopathy, unspecified: Secondary | ICD-10-CM | POA: Diagnosis present

## 2017-05-23 DIAGNOSIS — E785 Hyperlipidemia, unspecified: Secondary | ICD-10-CM | POA: Diagnosis present

## 2017-05-23 DIAGNOSIS — Z87442 Personal history of urinary calculi: Secondary | ICD-10-CM | POA: Diagnosis not present

## 2017-05-23 DIAGNOSIS — B964 Proteus (mirabilis) (morganii) as the cause of diseases classified elsewhere: Secondary | ICD-10-CM | POA: Diagnosis present

## 2017-05-23 DIAGNOSIS — Z8546 Personal history of malignant neoplasm of prostate: Secondary | ICD-10-CM | POA: Diagnosis not present

## 2017-05-23 DIAGNOSIS — Z8673 Personal history of transient ischemic attack (TIA), and cerebral infarction without residual deficits: Secondary | ICD-10-CM | POA: Diagnosis not present

## 2017-05-23 DIAGNOSIS — N200 Calculus of kidney: Secondary | ICD-10-CM

## 2017-05-23 DIAGNOSIS — N390 Urinary tract infection, site not specified: Secondary | ICD-10-CM

## 2017-05-23 HISTORY — PX: CYSTOSCOPY WITH STENT PLACEMENT: SHX5790

## 2017-05-23 LAB — PROTIME-INR
INR: 1.05
Prothrombin Time: 13.7 seconds (ref 11.4–15.2)

## 2017-05-23 LAB — COMPREHENSIVE METABOLIC PANEL
ALT: 14 U/L — AB (ref 17–63)
AST: 20 U/L (ref 15–41)
Albumin: 3.8 g/dL (ref 3.5–5.0)
Alkaline Phosphatase: 76 U/L (ref 38–126)
Anion gap: 9 (ref 5–15)
BILIRUBIN TOTAL: 1 mg/dL (ref 0.3–1.2)
BUN: 25 mg/dL — AB (ref 6–20)
CO2: 26 mmol/L (ref 22–32)
CREATININE: 1.26 mg/dL — AB (ref 0.61–1.24)
Calcium: 8.7 mg/dL — ABNORMAL LOW (ref 8.9–10.3)
Chloride: 104 mmol/L (ref 101–111)
GFR calc non Af Amer: 48 mL/min — ABNORMAL LOW (ref >60)
GFR, EST AFRICAN AMERICAN: 56 mL/min — AB (ref >60)
Glucose, Bld: 139 mg/dL — ABNORMAL HIGH (ref 65–99)
POTASSIUM: 4.2 mmol/L (ref 3.5–5.1)
Sodium: 139 mmol/L (ref 135–145)
TOTAL PROTEIN: 6.9 g/dL (ref 6.5–8.1)

## 2017-05-23 LAB — URINALYSIS, ROUTINE W REFLEX MICROSCOPIC
BILIRUBIN URINE: NEGATIVE
GLUCOSE, UA: NEGATIVE mg/dL
Ketones, ur: 5 mg/dL — AB
NITRITE: NEGATIVE
PH: 5 (ref 5.0–8.0)
Protein, ur: 30 mg/dL — AB
SPECIFIC GRAVITY, URINE: 1.024 (ref 1.005–1.030)

## 2017-05-23 LAB — TROPONIN I: Troponin I: <0.03 ng/mL (ref <0.03)

## 2017-05-23 LAB — CBC
HCT: 39.7 % — ABNORMAL LOW (ref 40.0–52.0)
Hemoglobin: 13 g/dL (ref 13.0–18.0)
MCH: 27.1 pg (ref 26.0–34.0)
MCHC: 32.8 g/dL (ref 32.0–36.0)
MCV: 82.6 fL (ref 80.0–100.0)
PLATELETS: 253 10*3/uL (ref 150–440)
RBC: 4.81 MIL/uL (ref 4.40–5.90)
RDW: 15.5 % — AB (ref 11.5–14.5)
WBC: 13.3 10*3/uL — AB (ref 3.8–10.6)

## 2017-05-23 LAB — LACTIC ACID, PLASMA: Lactic Acid, Venous: 1.5 mmol/L (ref 0.5–1.9)

## 2017-05-23 LAB — APTT: APTT: 28 s (ref 24–36)

## 2017-05-23 SURGERY — CYSTOSCOPY, WITH STENT INSERTION
Anesthesia: General | Site: Ureter | Laterality: Left | Wound class: Clean Contaminated

## 2017-05-23 MED ORDER — LIDOCAINE HCL (CARDIAC) 20 MG/ML IV SOLN
INTRAVENOUS | Status: DC | PRN
  Administered 2017-05-23: 80 mg via INTRAVENOUS

## 2017-05-23 MED ORDER — SODIUM CHLORIDE 0.9 % IV SOLN
INTRAVENOUS | Status: DC | PRN
  Administered 2017-05-23 (×2): via INTRAVENOUS

## 2017-05-23 MED ORDER — ONDANSETRON HCL 4 MG PO TABS: 4.0000 mg | ORAL_TABLET | Freq: Four times a day (QID) | ORAL | Status: DC | PRN

## 2017-05-23 MED ORDER — ONDANSETRON HCL 4 MG/2ML IJ SOLN
4.0000 mg | Freq: Once | INTRAMUSCULAR | Status: AC
  Administered 2017-05-23: 4 mg via INTRAVENOUS
  Filled 2017-05-23: qty 2

## 2017-05-23 MED ORDER — FENTANYL CITRATE (PF) 100 MCG/2ML IJ SOLN
INTRAMUSCULAR | Status: DC | PRN
Start: 2017-05-23 — End: 2017-05-23
  Administered 2017-05-23: 50 ug via INTRAVENOUS

## 2017-05-23 MED ORDER — ATORVASTATIN CALCIUM 20 MG PO TABS
20.0000 mg | ORAL_TABLET | Freq: Every day | ORAL | Status: DC
  Administered 2017-05-24 – 2017-05-27 (×4): 20 mg via ORAL
  Filled 2017-05-23 (×4): qty 1

## 2017-05-23 MED ORDER — FENTANYL CITRATE (PF) 100 MCG/2ML IJ SOLN: 25.0000 ug | INTRAMUSCULAR | Status: DC | PRN

## 2017-05-23 MED ORDER — ONDANSETRON HCL 4 MG/2ML IJ SOLN
INTRAMUSCULAR | Status: AC
  Filled 2017-05-23: qty 2

## 2017-05-23 MED ORDER — CIPROFLOXACIN IN D5W 400 MG/200ML IV SOLN
400.0000 mg | Freq: Once | INTRAVENOUS | Status: AC
  Administered 2017-05-23: 400 mg via INTRAVENOUS
  Filled 2017-05-23: qty 200

## 2017-05-23 MED ORDER — ONDANSETRON HCL 4 MG/2ML IJ SOLN
INTRAMUSCULAR | Status: DC | PRN
  Administered 2017-05-23: 4 mg via INTRAVENOUS

## 2017-05-23 MED ORDER — VITAMIN B-12 1000 MCG PO TABS
5000.0000 ug | ORAL_TABLET | Freq: Every day | ORAL | Status: DC
  Administered 2017-05-24 – 2017-05-27 (×4): 5000 ug via ORAL
  Filled 2017-05-23 (×4): qty 5

## 2017-05-23 MED ORDER — IOPAMIDOL (ISOVUE-370) INJECTION 76%
75.0000 mL | Freq: Once | INTRAVENOUS | Status: AC | PRN
  Administered 2017-05-23: 75 mL via INTRAVENOUS

## 2017-05-23 MED ORDER — ACETAMINOPHEN 650 MG RE SUPP: 650.0000 mg | Freq: Four times a day (QID) | RECTAL | Status: DC | PRN

## 2017-05-23 MED ORDER — SUCCINYLCHOLINE CHLORIDE 20 MG/ML IJ SOLN
INTRAMUSCULAR | Status: DC | PRN
  Administered 2017-05-23: 100 mg via INTRAVENOUS

## 2017-05-23 MED ORDER — PROPOFOL 10 MG/ML IV BOLUS
INTRAVENOUS | Status: AC
  Filled 2017-05-23: qty 20

## 2017-05-23 MED ORDER — SODIUM CHLORIDE 0.9 % IV SOLN
INTRAVENOUS | Status: DC
  Administered 2017-05-24 – 2017-05-27 (×6): via INTRAVENOUS

## 2017-05-23 MED ORDER — FUROSEMIDE 20 MG PO TABS
20.0000 mg | ORAL_TABLET | Freq: Every day | ORAL | Status: DC
  Administered 2017-05-24 – 2017-05-27 (×4): 20 mg via ORAL
  Filled 2017-05-23 (×4): qty 1

## 2017-05-23 MED ORDER — OCUVITE-LUTEIN PO CAPS
ORAL_CAPSULE | Freq: Every day | ORAL | Status: DC
  Administered 2017-05-24 – 2017-05-27 (×4): 1 via ORAL
  Filled 2017-05-23 (×4): qty 1

## 2017-05-23 MED ORDER — MORPHINE SULFATE (PF) 4 MG/ML IV SOLN
4.0000 mg | Freq: Once | INTRAVENOUS | Status: AC
  Administered 2017-05-23: 4 mg via INTRAVENOUS
  Filled 2017-05-23: qty 1

## 2017-05-23 MED ORDER — PHENYLEPHRINE HCL 10 MG/ML IJ SOLN
INTRAMUSCULAR | Status: DC | PRN
  Administered 2017-05-23: 200 ug via INTRAVENOUS
  Administered 2017-05-23: 100 ug via INTRAVENOUS
  Administered 2017-05-23: 8 ug via INTRAVENOUS

## 2017-05-23 MED ORDER — LIDOCAINE HCL (PF) 2 % IJ SOLN
INTRAMUSCULAR | Status: AC
  Filled 2017-05-23: qty 2

## 2017-05-23 MED ORDER — CIPROFLOXACIN IN D5W 400 MG/200ML IV SOLN
400.0000 mg | Freq: Two times a day (BID) | INTRAVENOUS | Status: DC
  Filled 2017-05-23 (×2): qty 200

## 2017-05-23 MED ORDER — SUCCINYLCHOLINE CHLORIDE 20 MG/ML IJ SOLN
INTRAMUSCULAR | Status: AC
  Filled 2017-05-23: qty 1

## 2017-05-23 MED ORDER — IOTHALAMATE MEGLUMINE 43 % IV SOLN
INTRAVENOUS | Status: DC | PRN
  Administered 2017-05-23: 5 mL

## 2017-05-23 MED ORDER — FENTANYL CITRATE (PF) 100 MCG/2ML IJ SOLN
INTRAMUSCULAR | Status: AC
  Filled 2017-05-23: qty 2

## 2017-05-23 MED ORDER — PROPOFOL 10 MG/ML IV BOLUS
INTRAVENOUS | Status: DC | PRN
  Administered 2017-05-23: 100 mg via INTRAVENOUS

## 2017-05-23 MED ORDER — ACETAMINOPHEN 325 MG PO TABS
650.0000 mg | ORAL_TABLET | Freq: Four times a day (QID) | ORAL | Status: DC | PRN
  Administered 2017-05-24 – 2017-05-26 (×3): 650 mg via ORAL
  Filled 2017-05-23 (×3): qty 2

## 2017-05-23 MED ORDER — ONDANSETRON HCL 4 MG/2ML IJ SOLN: 4.0000 mg | Freq: Four times a day (QID) | INTRAMUSCULAR | Status: DC | PRN

## 2017-05-23 MED ORDER — FAMOTIDINE 20 MG PO TABS
20.0000 mg | ORAL_TABLET | Freq: Two times a day (BID) | ORAL | Status: DC
  Administered 2017-05-24 – 2017-05-25 (×3): 20 mg via ORAL
  Filled 2017-05-23 (×3): qty 1

## 2017-05-23 MED ORDER — ONDANSETRON HCL 4 MG/2ML IJ SOLN: 4.0000 mg | Freq: Once | INTRAMUSCULAR | Status: DC | PRN

## 2017-05-23 SURGICAL SUPPLY — 22 items
BAG DRAIN CYSTO-URO LG1000N (MISCELLANEOUS) ×3 IMPLANT
CATH URETL 5X70 OPEN END (CATHETERS) ×3 IMPLANT
CONRAY 43 FOR UROLOGY 50M (MISCELLANEOUS) ×3 IMPLANT
GLOVE BIO SURGEON STRL SZ 6.5 (GLOVE) ×2 IMPLANT
GLOVE BIO SURGEONS STRL SZ 6.5 (GLOVE) ×1
GOWN STRL REUS W/ TWL LRG LVL3 (GOWN DISPOSABLE) ×2 IMPLANT
GOWN STRL REUS W/TWL LRG LVL3 (GOWN DISPOSABLE) ×6
KIT RM TURNOVER CYSTO AR (KITS) ×3 IMPLANT
PACK CYSTO AR (MISCELLANEOUS) ×3 IMPLANT
SCRUB POVIDONE IODINE 4 OZ (MISCELLANEOUS) ×2 IMPLANT
SENSORWIRE 0.038 NOT ANGLED (WIRE) ×3
SET CYSTO W/LG BORE CLAMP LF (SET/KITS/TRAYS/PACK) ×3 IMPLANT
SLEEVE SCD COMPRESS THIGH MED (MISCELLANEOUS) ×2 IMPLANT
SOL .9 NS 3000ML IRR  AL (IV SOLUTION) ×2
SOL .9 NS 3000ML IRR AL (IV SOLUTION) ×1
SOL .9 NS 3000ML IRR UROMATIC (IV SOLUTION) ×1 IMPLANT
STENT URET 6FRX24 CONTOUR (STENTS) IMPLANT
STENT URET 6FRX26 CONTOUR (STENTS) ×2 IMPLANT
SURGILUBE 2OZ TUBE FLIPTOP (MISCELLANEOUS) ×3 IMPLANT
SYRINGE IRR TOOMEY STRL 70CC (SYRINGE) ×3 IMPLANT
WATER STERILE IRR 1000ML POUR (IV SOLUTION) ×3 IMPLANT
WIRE SENSOR 0.038 NOT ANGLED (WIRE) ×1 IMPLANT

## 2017-05-23 NOTE — Anesthesia Post-op Follow-up Note (Signed)
Anesthesia QCDR form completed.        

## 2017-05-23 NOTE — Transfer of Care (Signed)
Immediate Anesthesia Transfer of Care Note  Patient: Matthew Hicks.  Procedure(s) Performed: Procedure(s): CYSTOSCOPY WITH STENT PLACEMENT (Left)  Patient Location: PACU  Anesthesia Type:General  Level of Consciousness: awake  Airway & Oxygen Therapy: Patient connected to face mask oxygen  Post-op Assessment: Post -op Vital signs reviewed and stable  Post vital signs: stable  Last Vitals:  Vitals:   05/23/17 1506 05/23/17 2114  BP: (!) 148/76 (!) 116/59  Pulse:  70  Resp: 20 16  Temp: 36.4 C 37.1 C  SpO2:  95%    Last Pain:  Vitals:   05/23/17 2114  TempSrc: Temporal  PainSc:          Complications: No apparent anesthesia complications

## 2017-05-23 NOTE — ED Triage Notes (Signed)
Pt states that he has been having left sided flank pain this am, chronic back pain, pt has been voiding blood for the past couple weeks, pt was taken off the xarelto Thursday due to possibly causing the bleeding in the urine, pt states that he vomited prior to coming in

## 2017-05-23 NOTE — Anesthesia Procedure Notes (Signed)
Procedure Name: Intubation Date/Time: 05/23/2017 8:43 PM Performed by: Aline Brochure Pre-anesthesia Checklist: Patient identified, Emergency Drugs available, Suction available and Patient being monitored Patient Re-evaluated:Patient Re-evaluated prior to induction Oxygen Delivery Method: Circle system utilized Preoxygenation: Pre-oxygenation with 100% oxygen Induction Type: IV induction, Rapid sequence and Cricoid Pressure applied Laryngoscope Size: Mac and 3 Grade View: Grade II Tube type: Oral Tube size: 7.0 mm Number of attempts: 1 Airway Equipment and Method: Stylet Placement Confirmation: ETT inserted through vocal cords under direct vision,  positive ETCO2 and breath sounds checked- equal and bilateral Secured at: 21 cm Tube secured with: Tape Dental Injury: Teeth and Oropharynx as per pre-operative assessment

## 2017-05-23 NOTE — H&P (Addendum)
Service Requesting Consultation: Emergency Medicine  Service Providing Consultation:  Urology, Dr. Janne Lab on behalf of [facility]   SUBJECTIVE:  CC:  Left ureteral stone  HPI: Gerasimos Plotts is a 83 y.o. old with a history of nephrolithiasis, now presenting with left flank pain and hematuria x 2 weeks. Pain ranges from 2-7/10. No fevers at home.  Some mild nausea but no vomiting.  Hx of prostate cancer s/p brachytherapy. No chest pain or trouble breathing. No pain on the right side  Give a urine sample clinic on Thursday but did not hear back. Has appointment with urology Tuesday.  In the ED today labs notable for WBC 13, Cr 1.2. UA with TNTC bacteria, large LE. VSS afebrile.  Past Medical History:  Diagnosis Date  . A-fib ([place]) 04/08/2014  . Aortic stenosis   . Cardiomyopathy ([place])   . Cataract   . CVA (cerebral infarction) 2003  . GERD (gastroesophageal reflux disease)   . Hearing loss   . Hyperlipidemia   . Hypertension 05/04/2014  . Lung mass   . Macular degeneration   . Nephrolithiasis   . Pernicious anemia   . Pneumonia 05/2015  . Prostate cancer [facility]) 2007   performed by Dr. Eliberto Ivory  . Skin cancer   . Spinal stenosis   :  Past Surgical History:  Procedure Laterality Date  . CATARACT EXTRACTION    . CIRCUMCISION  1994  . CYSTOSCOPY  1946  . laparoscopic prostectomy    . Spinal cyst removal    . TRANSURETHRAL RESECTION OF PROSTATE  2007   prostate cancer  :  No current facility-administered medications on file prior to encounter.    Current Outpatient Prescriptions on File Prior to Encounter  Medication Sig Dispense Refill  . atorvastatin (LIPITOR) 20 MG tablet TAKE ONE TABLET BY MOUTH EVERY DAY    . Cyanocobalamin (B-12) 5000 MCG CAPS Take 1 tablet by mouth daily.    . furosemide (LASIX) 20 MG tablet Take 20 mg by mouth daily.     . Multiple Vitamins-Minerals (ICAPS) CAPS Take 1 tablet by mouth daily.    . ranitidine (ZANTAC) 300 MG capsule Take 300 mg  by mouth daily.     . rivaroxaban (XARELTO) 10 MG TABS tablet TAKE 1 TABLET(10 MG TOTAL) BY MOUTH DAILY WITH DINNER.    Marland Kitchen azelastine (ASTELIN) 0.1 % nasal spray Place 2 sprays into the nose 1 day or 1 dose.    :  Allergies  Allergen Reactions  . Amoxicillin-Pot Clavulanate Swelling    angioedema  . Fesoterodine Fumarate Er Rash  . Oxycodone-Acetaminophen Rash  . Sulfa Antibiotics Rash  :    Social History  Substance Use Topics  . Smoking status: Former Smoker    Packs/day: 1.00    Years: 40.00    Types: Cigarettes  . Smokeless tobacco: Never Used     Comment: does not smoke now  . Alcohol use No  .   Denies tobacco, EtOH, and illicit drug use.  Family History  Problem Relation Age of Onset  . Stroke Father        heart disease  . Pneumonia Father   . Alzheimer's disease Brother   . Colon cancer Daughter 26  . Cancer - Other Paternal Aunt 42       "GYN cancer"  . Cancer - Other Paternal Grandmother 31       "GYN cancer"  :   OBJECTIVE:   Review of Systems  A 10 point review  of systems was completed and is negative except those point mentioned in the HPI  PHYSICAL EXAM:  Gen: Patient is pleasant and in NAD.  A+Ox4. Hard of hearing BP (!) 148/76 (BP Location: Left Arm)   Temp 97.6 F (36.4 C) (Oral)   Resp 20   Ht 5\' 4"  (1.626 m)   Wt 68 kg (150 lb)   BMI 25.75 kg/m  Mouth: Oropharynx nl. Neck: No cartoid bruits bilat.  No thyroidmegaly. Card: RRR  Pulm: CTAB Back: Left CVAT. Abdomen: BSx4.   No masses, tenderness, hernias noted.    LABS: Lab Results  Component Value Date   WBC 13.3 (H) 05/23/2017   HGB 13.0 05/23/2017   HCT 39.7 (L) 05/23/2017   MCV 82.6 05/23/2017   PLT 253 05/23/2017   Lab Results  Component Value Date   CREATININE 1.26 (H) 05/23/2017   Urinalysis    Component Value Date/Time   COLORURINE AMBER (A) 05/23/2017 1543   APPEARANCEUR CLOUDY (A) 05/23/2017 1543   LABSPEC 1.024 05/23/2017 1543   PHURINE 5.0 05/23/2017 1543    GLUCOSEU NEGATIVE 05/23/2017 1543   HGBUR MODERATE (A) 05/23/2017 1543   BILIRUBINUR NEGATIVE 05/23/2017 1543   KETONESUR 5 (A) 05/23/2017 1543   PROTEINUR 30 (A) 05/23/2017 1543   NITRITE NEGATIVE 05/23/2017 1543   LEUKOCYTESUR LARGE (A) 05/23/2017 1543     IMAGING: CT-scan of the abdomen and pelvis: FINDINGS: VASCULAR  Aorta: Extensive atherosclerosis of the abdominal aorta with fusiform ectasia of the infrarenal abdominal aorta which measures up to 2.8 cm in diameter.  Celiac: Patent without evidence of aneurysm, dissection, vasculitis or significant stenosis.  SMA: Atherosclerotic plaque at the origin resulting in mild stenosis. No evidence of dissection or vasculitis.  Renals: Single renal arteries bilaterally. Extensive atherosclerosis with mild ostial stenosis on the right and severe ostial stenosis on the left.  IMA: Patent without evidence of aneurysm, dissection, vasculitis or significant stenosis.  Inflow: Extensive atherosclerosis with aneurysmal dilatation of the right common iliac artery which measures up to 2.3 cm in diameter related to a large penetrating ulcer.  Proximal Outflow: Bilateral common femoral and visualized portions of the superficial and profunda femoral arteries are patent without evidence of aneurysm, dissection, vasculitis or significant stenosis.  Veins: No obvious venous abnormality within the limitations of this arterial phase study.  Review of the MIP images confirms the above findings.  NON-VASCULAR  Lower chest: Cardiomegaly with severe right atrial dilatation. Calcifications of the mitral annulus. Atherosclerotic calcifications in the right coronary artery. Small chronic right pleural effusion with chronic rounded atelectasis in the right lower lobe and posterior aspect of the lateral segment of theright middle lobe.  Hepatobiliary: No suspicious cystic or solid hepatic lesions. No intra or extrahepatic  biliary ductal dilatation. Gallbladder is normal in appearance.  Pancreas: No pancreatic mass. No pancreatic ductal dilatation. No pancreatic or peripancreatic fluid or inflammatory changes.  Spleen: Unremarkable.  Adrenals/Urinary Tract: 7 mm calculus at junction of proximal and middle third of the left ureter (axial image 96 of series 4) with mild proximal left hydroureteronephrosis and extensive left perinephric stranding. Delayed nephrogram on the left side related to obstruction. 1.6 cm simple cyst in the anterior aspect of the interpolar region of the right kidney. No suspicious renal lesions are noted. No right-sided hydroureteronephrosis. Urinary bladder is normal in appearance. 2.4 x 2.1 cm left adrenal nodule is similar to multiple prior examinations, most compatible with an adenoma. However, the previously noted right adrenal nodule has progressively enlarged over  serial prior examinations in currently measures 3.5 x 2.8 cm, concerning for possible metastatic lesion.  Stomach/Bowel: The appearance of the stomach is normal. There is no pathologic dilatation of small bowel or colon. Numerous colonic diverticulae are noted, without surrounding inflammatory changes to suggest an acute diverticulitis at this time. Normal appendix.  Lymphatic: No lymphadenopathy noted in the abdomen or pelvis.  Reproductive: Brachytherapy implants throughout the prostate gland. Asymmetric enlargement of the right seminal vesicle, stable to prior studies dating back to 2008, presumably benign.  Other: No significant volume of ascites.  No pneumoperitoneum.  Musculoskeletal: There are no aggressive appearing lytic or blastic lesions noted in the visualized portions of the skeleton.  IMPRESSION: VASCULAR  1. 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 No acute findings are noted. 2. Left anterior descending and right coronary artery disease.  NON-VASCULAR  1. 7 mm calculus at the junction of proximal and middle third of the left ureter with mild left-sided hydroureteronephrosis and extensive left-sided perinephric stranding indicative of obstruction. This is likely the source of the patient's symptoms. 2. Colonic diverticulosis without evidence of acute diverticulitis at this time. 3. Slow progressive enlargement of a right adrenal mass which is incompletely characterized but currently measures 3.5 x 2.8 cm, concerning for a metastatic lesion or primary adrenal lesion. This could be further evaluated with followup nonemergent adrenal protocol CT scan. 4. Left adrenal nodule is stable compared to numerous prior examinations, previously characterized as an adenoma. 5. Cardiomegaly with severe right atrial dilatation. 6. Small chronic right pleural effusion with areas of rounded atelectasis in the right lung base, similar to prior studies. 7. Additional incidental findings, as above. Aortic Atherosclerosis (ICD10-I70.0).   ASSESSMENT:   82 y.o. old male with a left-sided ureteral stone requiring urgent stenting due to concern for infection.    PLAN:  1.  Post case to OR for Left ureteral stent 2.  Make patient NPO 3.  Empiric antibiotic coverage with cipro until cultures result 4.  IVF hydration  5.  Admit to hospital medicine following stent placement for observation.  6.  Likely definitive stone management in ~2 weeks (window for treatment is 2 weeks to 3 months s/p stent placement) 7.  Will need follow up imaging for adrenal mass noted in above imaging for further characterization.  8. No need to hold anticoagulation at this time 9. Catheter will be placed in the OR for maximal drainage. Can be removed POD 1 if afebrile overnight.   Heron Sabins, MD

## 2017-05-23 NOTE — H&P (Signed)
Matthew Hicks at [place] NAME: Matthew Hicks    MR#:  [id number]  DATE OF BIRTH:  07/27/1925  DATE OF ADMISSION:  05/23/2017  PRIMARY CARE PHYSICIAN: Kirk Ruths, MD   REQUESTING/REFERRING PHYSICIAN: Dr. Delman Kitten.   CHIEF COMPLAINT:   Chief Complaint  Patient presents with  . Flank Pain    HISTORY OF PRESENT ILLNESS:  Rollen Hicks  is a 83 y.o. male with a known history of Chronic atrial fibrillation, aortic stenosis, history of previous CVA, GERD, hypertension, hyperlipidemia, history of prostate cancer who presents to the hospital due to hematuria which has been intermittent in nature. Patient says he's been having hematuria now for the past 2 weeks intermittently. Today he developed some left-sided flank pain which radiated to his groin associated with some nausea vomiting and chills and therefore his family brought him to the ER for further evaluation. In the emergency room patient underwent a CT scan of his abdomen pelvis which showed left-sided nephrolithiasis with hydronephrosis. Urology has been consulted and plans on doing a cystoscopy with stent placement today. Patient was started on IV antibiotics and fluids and hospitalist services were contacted for further treatment and evaluation.  PAST MEDICAL HISTORY:   Past Medical History:  Diagnosis Date  . A-fib ([place]) 04/08/2014  . Aortic stenosis   . Cardiomyopathy ([place])   . Cataract   . CVA (cerebral infarction) 2003  . GERD (gastroesophageal reflux disease)   . Hearing loss   . Hyperlipidemia   . Hypertension 05/04/2014  . Lung mass   . Macular degeneration   . Nephrolithiasis   . Pernicious anemia   . Pneumonia 05/2015  . Prostate cancer [facility]) 2007   performed by Dr. Eliberto Ivory  . Skin cancer   . Spinal stenosis     PAST SURGICAL HISTORY:   Past Surgical History:  Procedure Laterality Date  . CATARACT EXTRACTION    . CIRCUMCISION  1994  . CYSTOSCOPY  1946  . laparoscopic  prostectomy    . Spinal cyst removal    . TRANSURETHRAL RESECTION OF PROSTATE  2007   prostate cancer    SOCIAL HISTORY:   Social History  Substance Use Topics  . Smoking status: Former Smoker    Packs/day: 1.00    Years: 40.00    Types: Cigarettes  . Smokeless tobacco: Never Used     Comment: does not smoke now  . Alcohol use No    FAMILY HISTORY:   Family History  Problem Relation Age of Onset  . Stroke Father        heart disease  . Pneumonia Father   . Alzheimer's disease Brother   . Colon cancer Daughter 81  . Cancer - Other Paternal Aunt 64       "GYN cancer"  . Cancer - Other Paternal Grandmother 66       "GYN cancer"    DRUG ALLERGIES:   Allergies  Allergen Reactions  . Amoxicillin-Pot Clavulanate Swelling    angioedema  . Fesoterodine Fumarate Er Rash  . Oxycodone-Acetaminophen Rash  . Sulfa Antibiotics Rash    REVIEW OF SYSTEMS:   Review of Systems  Constitutional: Negative for fever and weight loss.  HENT: Negative for congestion, nosebleeds and tinnitus.   Eyes: Negative for blurred vision, double vision and redness.  Respiratory: Negative for cough, hemoptysis and shortness of breath.   Cardiovascular: Negative for chest pain, orthopnea, leg swelling and PND.  Gastrointestinal: Positive for abdominal pain,  nausea and vomiting. Negative for diarrhea and melena.  Genitourinary: Positive for dysuria, frequency and hematuria. Negative for urgency.  Musculoskeletal: Negative for falls and joint pain.  Neurological: Negative for dizziness, tingling, sensory change, focal weakness, seizures, weakness and headaches.  Endo/Heme/Allergies: Negative for polydipsia. Does not bruise/bleed easily.  Psychiatric/Behavioral: Negative for depression and memory loss. The patient is not nervous/anxious.     MEDICATIONS AT HOME:   Prior to Admission medications   Medication Sig Start Date End Date Taking? Authorizing Provider  atorvastatin (LIPITOR) 20 MG  tablet TAKE ONE TABLET BY MOUTH EVERY DAY 07/18/15  Yes [provider]  Cyanocobalamin (B-12) 5000 MCG CAPS Take 1 tablet by mouth daily.   Yes [provider]  furosemide (LASIX) 20 MG tablet Take 20 mg by mouth daily.  09/11/15  Yes [provider]  Multiple Vitamins-Minerals (ICAPS) CAPS Take 1 tablet by mouth daily.   Yes [provider]  ranitidine (ZANTAC) 300 MG capsule Take 300 mg by mouth daily.  05/13/16 05/23/18 Yes [provider]  rivaroxaban (XARELTO) 10 MG TABS tablet TAKE 1 TABLET(10 MG TOTAL) BY MOUTH DAILY WITH DINNER. 08/26/15  Yes [provider]  azelastine (ASTELIN) 0.1 % nasal spray Place 2 sprays into the nose 1 day or 1 dose. 11/14/15 11/13/16  [provider]      VITAL SIGNS:  Blood pressure (!) 148/76, temperature 97.6 F (36.4 C), temperature source Oral, resp. rate 20, height 5\' 4"  (1.626 m), weight 68 kg (150 lb).  PHYSICAL EXAMINATION:  Physical Exam  GENERAL:  83 y.o.-year-old patient lying in the bed in no acute distress.  EYES: Pupils equal, round, reactive to light and accommodation. No scleral icterus. Extraocular muscles intact.  HEENT: Head atraumatic, normocephalic. Oropharynx and nasopharynx clear. No oropharyngeal erythema, moist oral mucosa  NECK:  Supple, no jugular venous distention. No thyroid enlargement, no tenderness.  LUNGS: Normal breath sounds bilaterally, no wheezing, rales, rhonchi. No use of accessory muscles of respiration.  CARDIOVASCULAR: S1, S2, Irregular. 2/6 systolic ejection murmur heard at the right sternal border, No rubs, gallops, clicks.  ABDOMEN: Soft, nontender, nondistended. Bowel sounds present. No organomegaly or mass.  EXTREMITIES: No pedal edema, cyanosis, or clubbing. + 2 pedal & radial pulses b/l.   NEUROLOGIC: Cranial nerves II through XII are intact. No focal Motor or sensory deficits appreciated b/l. PSYCHIATRIC: The patient is alert and oriented x 3. SKIN:  No obvious rash, lesion, or ulcer.   LABORATORY PANEL:   CBC  Recent Labs Lab 05/23/17 1543  WBC 13.3*  HGB 13.0  HCT 39.7*  PLT 253   ------------------------------------------------------------------------------------------------------------------  Chemistries   Recent Labs Lab 05/23/17 1543  NA 139  K 4.2  CL 104  CO2 26  GLUCOSE 139*  BUN 25*  CREATININE 1.26*  CALCIUM 8.7*  AST 20  ALT 14*  ALKPHOS 76  BILITOT 1.0   ------------------------------------------------------------------------------------------------------------------  Cardiac Enzymes  Recent Labs Lab 05/23/17 1543  TROPONINI <0.03   ------------------------------------------------------------------------------------------------------------------  RADIOLOGY:  Ct Angio Abd/pel W And/or Wo Contrast  Result Date: 05/23/2017 CLINICAL DATA:  83 year old male with history of left-sided flank pain, hematuria and vomiting today. History of prostate cancer. EXAM: CTA ABDOMEN AND PELVIS WITHOUT AND WITH CONTRAST TECHNIQUE: Multidetector CT imaging of the abdomen and pelvis was performed using the standard protocol during bolus administration of intravenous contrast. Multiplanar reconstructed images and MIPs were obtained and reviewed to evaluate the vascular anatomy. CONTRAST:  75 mL of Isovue 370. COMPARISON:  PET-CT  06/25/2015. CT the abdomen and pelvis 10/05/2007. FINDINGS: VASCULAR Aorta: Extensive atherosclerosis of the abdominal aorta with fusiform ectasia of the infrarenal abdominal aorta which measures up to 2.8 cm in diameter. Celiac: Patent without evidence of aneurysm, dissection, vasculitis or significant stenosis. SMA: Atherosclerotic plaque at the origin resulting in mild stenosis. No evidence of dissection or vasculitis. Renals: Single renal arteries bilaterally. Extensive atherosclerosis with mild ostial stenosis on the right and severe ostial stenosis on the left. IMA: Patent without evidence of  aneurysm, dissection, vasculitis or significant stenosis. Inflow: Extensive atherosclerosis with aneurysmal dilatation of the right common iliac artery which measures up to 2.3 cm in diameter related to a large penetrating ulcer. Proximal Outflow: Bilateral common femoral and visualized portions of the superficial and profunda femoral arteries are patent without evidence of aneurysm, dissection, vasculitis or significant stenosis. Veins: No obvious venous abnormality within the limitations of this arterial phase study. Review of the MIP images confirms the above findings. NON-VASCULAR Lower chest: Cardiomegaly with severe right atrial dilatation. Calcifications of the mitral annulus. Atherosclerotic calcifications in the right coronary artery. Small chronic right pleural effusion with chronic rounded atelectasis in the right lower lobe and posterior aspect of the lateral segment of theright middle lobe. Hepatobiliary: No suspicious cystic or solid hepatic lesions. No intra or extrahepatic biliary ductal dilatation. Gallbladder is normal in appearance. Pancreas: No pancreatic mass. No pancreatic ductal dilatation. No pancreatic or peripancreatic fluid or inflammatory changes. Spleen: Unremarkable. Adrenals/Urinary Tract: 7 mm calculus at junction of proximal and middle third of the left ureter (axial image 96 of series 4) with mild proximal left hydroureteronephrosis and extensive left perinephric stranding. Delayed nephrogram on the left side related to obstruction. 1.6 cm simple cyst in the anterior aspect of the interpolar region of the right kidney. No suspicious renal lesions are noted. No right-sided hydroureteronephrosis. Urinary bladder is normal in appearance. 2.4 x 2.1 cm left adrenal nodule is similar to multiple prior examinations, most compatible with an adenoma. However, the previously noted right adrenal nodule has progressively enlarged over serial prior examinations in currently measures 3.5 x 2.8  cm, concerning for possible metastatic lesion. Stomach/Bowel: The appearance of the stomach is normal. There is no pathologic dilatation of small bowel or colon. Numerous colonic diverticulae are noted, without surrounding inflammatory changes to suggest an acute diverticulitis at this time. Normal appendix. Lymphatic: No lymphadenopathy noted in the abdomen or pelvis. Reproductive: Brachytherapy implants throughout the prostate gland. Asymmetric enlargement of the right seminal vesicle, stable to prior studies dating back to 2008, presumably benign. Other: No significant volume of ascites.  No pneumoperitoneum. Musculoskeletal: There are no aggressive appearing lytic or blastic lesions noted in the visualized portions of the skeleton. IMPRESSION: VASCULAR 1. 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 No acute findings are noted. 2. Left anterior descending and right coronary artery disease. NON-VASCULAR 1. 7 mm calculus at the junction of proximal and middle third of the left ureter with mild left-sided hydroureteronephrosis and extensive left-sided perinephric stranding indicative of obstruction. This is likely the source of the patient's symptoms. 2. Colonic diverticulosis without evidence of acute diverticulitis at this time. 3. Slow progressive enlargement of a right adrenal mass which is incompletely characterized but currently measures 3.5 x 2.8 cm, concerning for a metastatic lesion or primary adrenal lesion. This could be further evaluated with followup nonemergent adrenal protocol  CT scan. 4. Left adrenal nodule is stable compared to numerous prior examinations, previously characterized as an adenoma. 5. Cardiomegaly with severe right atrial dilatation. 6. Small chronic right pleural effusion with areas of rounded atelectasis in the  right lung base, similar to prior studies. 7. Additional incidental findings, as above. Aortic Atherosclerosis (ICD10-I70.0). Electronically Signed   By: Vinnie Langton M.D.   On: 05/23/2017 18:57     IMPRESSION AND PLAN:   84 year old male with past medical history of prostate cancer, previous history of nephrolithiasis, hypertension, hyperlipidemia, GERD, previous history of CVA, aortic stenosis, chronic atrial fibrillation who presents to the hospital due to hematuria.  1. Nephrolithiasis-this is the cause of patient's left flank pain along with hematuria. -Urology to be consulted the plan on doing cystoscopy with stent placement/stone extraction today. -Continue supportive care with IV fluids, IV ciprofloxacin and pain control.  2. Hematuria-secondary to #1. -Hemoglobin stable. Xarelto currently is on hold.  3. History of chronic atrial fibrillation-rate controlled. -Xarelto on hold given the patient's hematuria. Next  4. Hyperlipidemia-continue atorvastatin.  5. GERD-continue Pepcid.   All the records are reviewed and case discussed with ED provider. Management plans discussed with the patient, family and they are in agreement.  CODE STATUS: Full  TOTAL TIME TAKING CARE OF THIS PATIENT: 40 minutes.    Henreitta Leber M.D on 05/23/2017 at 8:04 PM  Between 7am to 6pm - Pager - [phone]  After 6pm go to [email or web address] - password [email or web address] [facility]  [place] [facility]  Office  [phone]  CC: Primary care physician; Kirk Ruths, MD

## 2017-05-23 NOTE — ED Provider Notes (Signed)
[facility] Emergency Department Provider Note ____________________________________________   First MD Initiated Contact with Patient 05/23/17 1523     (approximate)  I have reviewed the triage vital signs and the nursing notes.   HISTORY  Chief Complaint Flank Pain  HPI Matthew Hicks. is a 83 y.o. male here for evaluation of left flank pain and blood in his urine for 2 weeks  Patient for as well as his family that he has a history of kidney stones were not for several years. Been experiencing pain in his left flank and left back now for about 2 weeks and began seeing blood in his urine as well. Saw his doctor and they stopped his relative on Wednesday. He has not had any fevers or chills. Some mild nausea but no vomiting. Reports a moderate to severe pain that is sharp in his left lower flank and back with some blood in the urine.  No chest pain or trouble breathing. No pain on the right siden.  Give a urine sample clinic on Thursday but did not hear back. Has appointmeith urology Tuesday   Past Medical History:  Diagnosis Date  . A-fib ([place]) 04/08/2014  . Aortic stenosis   . Cardiomyopathy ([place])   . Cataract   . CVA (cerebral infarction) 2003  . GERD (gastroesophageal reflux disease)   . Hearing loss   . Hyperlipidemia   . Hypertension 05/04/2014  . Lung mass   . Macular degeneration   . Nephrolithiasis   . Pernicious anemia   . Pneumonia 05/2015  . Prostate cancer [facility]) 2007   performed by Dr. Eliberto Ivory  . Skin cancer   . Spinal stenosis     Patient Active Problem List   Diagnosis Date Noted  . Nephrolithiasis 05/23/2017  . Pure hypercholesterolemia 05/13/2016  . History of stroke 11/14/2015  . Cerebral vascular accident ([place]) 07/02/2014  . BP (high blood pressure) 05/04/2014  . A-fib ([place]) 04/08/2014  . Benign hypertension 04/08/2014  . Aortic heart valve narrowing 04/08/2014  . Cardiomyopathy ([place]) 04/08/2014  . Acid reflux 04/08/2014   . HLD (hyperlipidemia) 04/08/2014  . [place] anemia 04/08/2014  . Spinal stenosis 04/08/2014    Past Surgical History:  Procedure Laterality Date  . CATARACT EXTRACTION    . CIRCUMCISION  1994  . CYSTOSCOPY  1946  . laparoscopic prostectomy    . Spinal cyst removal    . TRANSURETHRAL RESECTION OF PROSTATE  2007   prostate cancer    Prior to Admission medications   Medication Sig Start Date End Date Taking? Authorizing Provider  atorvastatin (LIPITOR) 20 MG tablet TAKE ONE TABLET BY MOUTH EVERY DAY 07/18/15  Yes [provider]  Cyanocobalamin (B-12) 5000 MCG CAPS Take 1 tablet by mouth daily.   Yes [provider]  furosemide (LASIX) 20 MG tablet Take 20 mg by mouth daily.  09/11/15  Yes [provider]  Multiple Vitamins-Minerals (ICAPS) CAPS Take 1 tablet by mouth daily.   Yes [provider]  ranitidine (ZANTAC) 300 MG capsule Take 300 mg by mouth daily.  05/13/16 05/23/18 Yes [provider]  rivaroxaban (XARELTO) 10 MG TABS tablet TAKE 1 TABLET(10 MG TOTAL) BY MOUTH DAILY WITH DINNER. 08/26/15  Yes [provider]  azelastine (ASTELIN) 0.1 % nasal spray Place 2 sprays into the nose 1 day or 1 dose. 11/14/15 11/13/16  [provider]    Allergies Amoxicillin-pot clavulanate; Fesoterodine fumarate er; Oxycodone-acetaminophen; and Sulfa antibiotics  Family History  Problem  Relation Age of Onset  . Stroke Father        heart disease  . Pneumonia Father   . Alzheimer's disease Brother   . Colon cancer Daughter 76  . Cancer - Other Paternal Aunt 13       "GYN cancer"  . Cancer - Other Paternal Grandmother 40       "GYN cancer"    Social History Social History  Substance Use Topics  . Smoking status: Former Smoker    Packs/day: 1.00    Years: 40.00    Types: Cigarettes  . Smokeless tobacco: Never Used     Comment: does not smoke now  . Alcohol use No    Review of Systems Constitutional: No  fever/chills Eyes: No visual changes. ENT: No sore throat. Cardiovascular: Denies chest pain. Respiratory: Denies shortness of breath. Gastrointestinal: no vomiting No diarrhea.  No constipation. Genitourinary: Negative for dysuria. Musculoskeletal: Negative for back pain. Skin: Negative for rash. Neurological: Negative for headaches, focal weakness or numbness.    ____________________________________________   PHYSICAL EXAM:  VITAL SIGNS: ED Triage Vitals  Enc Vitals Group     BP 05/23/17 1506 (!) 148/76     Pulse --      Resp 05/23/17 1506 20     Temp 05/23/17 1506 97.6 F (36.4 C)     Temp Source 05/23/17 1506 Oral     SpO2 --      Weight 05/23/17 1509 150 lb (68 kg)     Height 05/23/17 1509 5\' 4"  (1.626 m)     Head Circumference --      Peak Flow --      Pain Score 05/23/17 1506 8     Pain Loc --      Pain Edu? --      Excl. in [place]? --     Constitutional: Alert and oriented. Well appearing and in no acute distress. Eyes: Conjunctivae are normal. Head: Atraumatic. Nose: No congestion/rhinnorhea. Mouth/Throat: Mucous membranes are moist. Neck: No stridor.   Cardiovascular: Normal rate, regular rhythm. Grossly normal heart sounds.  Good peripheral circulation. Respiratory: Normal respiratory effort.  No retractions. Lungs CTAB. Gastrointestinal: Soft and nontenderexcept in the left flank and left lower quadrant where he reports moderate discomfort and pain. No distentiono groin pain or mass. No  Penile Lesions. Musculoskeletal: No lower extremity tenderness nor edema. Neurologic:  Normal speech and language. No gross focal neurologic deficits are appreciated.  Skin:  Skin is warm, dry and intact. No rash noted. Psychiatric: Mood and affect are normal. Speech and behavior are normal.  ____________________________________________   LABS (all labs ordered are listed, but only abnormal results are displayed)  Labs Reviewed  COMPREHENSIVE METABOLIC PANEL -  Abnormal; Notable for the following:       Result Value   Glucose, Bld 139 (*)    BUN 25 (*)    Creatinine, Ser 1.26 (*)    Calcium 8.7 (*)    ALT 14 (*)    GFR calc non Af Amer 48 (*)    GFR calc Af Amer 56 (*)    All other components within normal limits  CBC - Abnormal; Notable for the following:    WBC 13.3 (*)    HCT 39.7 (*)    RDW 15.5 (*)    All other components within normal limits  URINALYSIS, ROUTINE W REFLEX MICROSCOPIC - Abnormal; Notable for the following:    Color, Urine AMBER (*)    APPearance CLOUDY (*)  Hgb urine dipstick MODERATE (*)    Ketones, ur 5 (*)    Protein, ur 30 (*)    Leukocytes, UA LARGE (*)    Bacteria, UA RARE (*)    Squamous Epithelial / LPF 0-5 (*)    All other components within normal limits  URINE CULTURE  LACTIC ACID, PLASMA  TROPONIN I  PROTIME-INR  APTT  LACTIC ACID, PLASMA   ____________________________________________  EKG   ____________________________________________  RADIOLOGY  Ct Angio Abd/pel W And/or Wo Contrast  Result Date: 05/23/2017 CLINICAL DATA:  83 year old male with history of left-sided flank pain, hematuria and vomiting today. History of prostate cancer. EXAM: CTA ABDOMEN AND PELVIS WITHOUT AND WITH CONTRAST TECHNIQUE: Multidetector CT imaging of the abdomen and pelvis was performed using the standard protocol during bolus administration of intravenous contrast. Multiplanar reconstructed images and MIPs were obtained and reviewed to evaluate the vascular anatomy. CONTRAST:  75 mL of Isovue 370. COMPARISON:  PET-CT 06/25/2015. CT the abdomen and pelvis 10/05/2007. FINDINGS: VASCULAR Aorta: Extensive atherosclerosis of the abdominal aorta with fusiform ectasia of the infrarenal abdominal aorta which measures up to 2.8 cm in diameter. Celiac: Patent without evidence of aneurysm, dissection, vasculitis or significant stenosis. SMA: Atherosclerotic plaque at the origin resulting in mild stenosis. No evidence of  dissection or vasculitis. Renals: Single renal arteries bilaterally. Extensive atherosclerosis with mild ostial stenosis on the right and severe ostial stenosis on the left. IMA: Patent without evidence of aneurysm, dissection, vasculitis or significant stenosis. Inflow: Extensive atherosclerosis with aneurysmal dilatation of the right common iliac artery which measures up to 2.3 cm in diameter related to a large penetrating ulcer. Proximal Outflow: Bilateral common femoral and visualized portions of the superficial and profunda femoral arteries are patent without evidence of aneurysm, dissection, vasculitis or significant stenosis. Veins: No obvious venous abnormality within the limitations of this arterial phase study. Review of the MIP images confirms the above findings. NON-VASCULAR Lower chest: Cardiomegaly with severe right atrial dilatation. Calcifications of the mitral annulus. Atherosclerotic calcifications in the right coronary artery. Small chronic right pleural effusion with chronic rounded atelectasis in the right lower lobe and posterior aspect of the lateral segment of theright middle lobe. Hepatobiliary: No suspicious cystic or solid hepatic lesions. No intra or extrahepatic biliary ductal dilatation. Gallbladder is normal in appearance. Pancreas: No pancreatic mass. No pancreatic ductal dilatation. No pancreatic or peripancreatic fluid or inflammatory changes. Spleen: Unremarkable. Adrenals/Urinary Tract: 7 mm calculus at junction of proximal and middle third of the left ureter (axial image 96 of series 4) with mild proximal left hydroureteronephrosis and extensive left perinephric stranding. Delayed nephrogram on the left side related to obstruction. 1.6 cm simple cyst in the anterior aspect of the interpolar region of the right kidney. No suspicious renal lesions are noted. No right-sided hydroureteronephrosis. Urinary bladder is normal in appearance. 2.4 x 2.1 cm left adrenal nodule is similar to  multiple prior examinations, most compatible with an adenoma. However, the previously noted right adrenal nodule has progressively enlarged over serial prior examinations in currently measures 3.5 x 2.8 cm, concerning for possible metastatic lesion. Stomach/Bowel: The appearance of the stomach is normal. There is no pathologic dilatation of small bowel or colon. Numerous colonic diverticulae are noted, without surrounding inflammatory changes to suggest an acute diverticulitis at this time. Normal appendix. Lymphatic: No lymphadenopathy noted in the abdomen or pelvis. Reproductive: Brachytherapy implants throughout the prostate gland. Asymmetric enlargement of the right seminal vesicle, stable to prior studies dating back to 2008, presumably  benign. Other: No significant volume of ascites.  No pneumoperitoneum. Musculoskeletal: There are no aggressive appearing lytic or blastic lesions noted in the visualized portions of the skeleton. IMPRESSION: VASCULAR 1. 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 No acute findings are noted. 2. Left anterior descending and right coronary artery disease. NON-VASCULAR 1. 7 mm calculus at the junction of proximal and middle third of the left ureter with mild left-sided hydroureteronephrosis and extensive left-sided perinephric stranding indicative of obstruction. This is likely the source of the patient's symptoms. 2. Colonic diverticulosis without evidence of acute diverticulitis at this time. 3. Slow progressive enlargement of a right adrenal mass which is incompletely characterized but currently measures 3.5 x 2.8 cm, concerning for a metastatic lesion or primary adrenal lesion. This could be further evaluated with followup nonemergent adrenal protocol CT scan. 4. Left adrenal nodule is stable compared to  numerous prior examinations, previously characterized as an adenoma. 5. Cardiomegaly with severe right atrial dilatation. 6. Small chronic right pleural effusion with areas of rounded atelectasis in the right lung base, similar to prior studies. 7. Additional incidental findings, as above. Aortic Atherosclerosis (ICD10-I70.0). Electronically Signed   By: Vinnie Langton M.D.   On: 05/23/2017 18:57    ____________________________________________   PROCEDURES  Procedure(s) performed: None  Procedures  Critical Care performed: No  ____________________________________________   INITIAL IMPRESSION / ASSESSMENT AND PLAN / ED COURSE  Pertinent labs & imaging results that were available during my care of the patient were reviewed by me and considered in my medical decision making (see chart for details).  Differential diagnosis includes but is not limited to, abdominal perforation, aortic dissection, cholecystitis, appendicitis, diverticulitis, colitis, esophagitis/gastritis, kidney stone, pyelonephritis, urinary tract infection, aortic aneurysm. All are considered in decision and treatment plan. Based upon the patient's presentation and risk factors, suspect likely hematuria possibly secondary to infection, kidney one, or combination thereof. However given the patient's age and presenting symptoms of proceed with CT scan with vascular focus on ruling out vascular which could mimicassociated left-sided flank pain   Clinical Course as of May 23 2004  Sun May 23, 2017  1910 Cipro, admit recommended by Urology  [MQ]    Clinical Course User Index [MQ] Delman Kitten, MD     ____________________________________________   FINAL CLINICAL IMPRESSION(S) / ED DIAGNOSES  Final diagnoses:  Urinary tract infection, acute  Kidney stone on left side  Adrenal tumor      NEW MEDICATIONS STARTED DURING THIS VISIT:  New Prescriptions   No medications on file     Note:  This document was  prepared using Dragon voice recognition software and may include unintentional dictation errors.     Delman Kitten, MD 05/23/17 2006

## 2017-05-23 NOTE — Anesthesia Preprocedure Evaluation (Signed)
Anesthesia Evaluation  Patient identified by MRN, date of birth, ID band Patient awake    Reviewed: Allergy & Precautions, H&P , NPO status , Patient's Chart, lab work & pertinent test results, reviewed documented beta blocker date and time   History of Anesthesia Complications Negative for: history of anesthetic complications  Airway Mallampati: II  TM Distance: >3 FB Neck ROM: full    Dental  (+) Partial Upper, Dental Advidsory Given, Missing   Pulmonary neg pulmonary ROS, shortness of breath (due to a fib), neg sleep apnea, neg COPD, neg recent URI, former smoker,           Cardiovascular Exercise Tolerance: Good hypertension, (-) angina(-) CAD, (-) Past MI, (-) Cardiac Stents and (-) CABG + dysrhythmias Atrial Fibrillation + Valvular Problems/Murmurs AS      Neuro/Psych neg Seizures CVA, No Residual Symptoms negative psych ROS   GI/Hepatic Neg liver ROS, GERD  ,  Endo/Other  negative endocrine ROS  Renal/GU Renal disease (kidney stones)  negative genitourinary   Musculoskeletal   Abdominal   Peds  Hematology  (+) Blood dyscrasia, anemia ,   Anesthesia Other Findings Past Medical History: 04/08/2014: A-fib ([place]) No date: Aortic stenosis No date: Cardiomyopathy [facility]) No date: Cataract 2003: CVA (cerebral infarction) No date: GERD (gastroesophageal reflux disease) No date: Hearing loss No date: Hyperlipidemia 05/04/2014: Hypertension No date: Lung mass No date: Macular degeneration No date: Nephrolithiasis No date: Pernicious anemia 05/2015: Pneumonia 2007: Prostate cancer ([place])     Comment:  performed by Dr. Eliberto Ivory No date: Skin cancer No date: Spinal stenosis   Reproductive/Obstetrics negative OB ROS                             Anesthesia Physical Anesthesia Plan  ASA: III and emergent  Anesthesia Plan: General   Post-op Pain Management:    Induction: Intravenous  PONV  Risk Score and Plan: 2 and Ondansetron and Dexamethasone  Airway Management Planned: Oral ETT  Additional Equipment:   Intra-op Plan:   Post-operative Plan: Extubation in OR  Informed Consent: I have reviewed the patients History and Physical, chart, labs and discussed the procedure including the risks, benefits and alternatives for the proposed anesthesia with the patient or authorized representative who has indicated his/her understanding and acceptance.   Dental Advisory Given  Plan Discussed with: Anesthesiologist, CRNA and Surgeon  Anesthesia Plan Comments:         Anesthesia Quick Evaluation

## 2017-05-23 NOTE — Op Note (Signed)
Operative Note   05/23/2017  PRE-OP DIAGNOSIS:Obstructing left ureteral stone, Urinary tract infection   POST-OP DIAGNOSIS:Obstructing left ureteral stone, Urinary tract infection  Procedure(s): CYSTOSCOPY WITH STENT PLACEMENT - LEFT  SURGEON: Surgeon(s) and Role:    * Heron Sabins, MD - Primary  ANESTHESIA: General   RADIOLOGIC FINDINGS:  1. Retrograde pyelogram was performed on the left side using a 5 Fr open ended catheter. 5ccof 50% dilute Isovue was injected.  2. The following findings were noted: Mild hydroureteronephrosis.  Mildly dilated calyces.  No filling defects.  Contrast drained out of the collecting system.   OPERATIVE FINDINGS:  1. Cystoscopy demonstrated evidence of prior radiation, otherwise no mucosal lesions, stones, or masses.  INDICATION FOR PROCEDURE:Patient is a 83 y.o. male with a 70mm size stone located in the left mid ureter.     Patients indication for urgent stenting is UA concerning for infection.  We discussed options for management and he selected to undergo ureteral stent placement. I discussed with the patients risks and benefits and alternatives to ureteral stent placement.  The risks include bleeding, infection, injury to the urethra, bladder, ureter or kidney, risk of needing a nephrostomy tube, risk of stricture, risk of loss of kidney, risks of anesthesia including DVT, PE, MI and death.   We discussed stent related symptoms and probable duration of stent placement.  The patient was counseled that the stent is temporary and will be removed either cystoscopically in the office or at the time of definitive stone treatment.  The patient understands these risks and wishes to proceed with ureteral stent placement.  PROCEDURE IN DETAIL: After the patient was identified in the holding and informed consent was obtained.  The patient was brought to the operating room and placed supine on the OR table.  Patient was given cipro 400mg  for IV antibiotics.   General anesthesia was then administered.  A surgical time-out was performed.  The patient was placed in the dorsal lithotomy position and prepped and draped in the usual sterile fashion. A 21-French rigid cystoscope was used to enter the bladder.  The bladder was inspected in its entirety and there were no lesions noted.  The ureteral orifices were identified in their normal orthotopic positions.  The left ureteral orifice was identified and a 5 Fr open ended catheter was placed into the ureteral orifice and advanced into the renal pelvis over a wire. The wire was removed and purulent urine was seen to drip out the catheter. This was sent for culture.  The stone was visible on scout fluoroscopic guidance.  A retrograde pyelogram was performed with injection of 50/50 Isovue which demonstrated mild hydroureteronephrosis.  The Sensor wire was replaced up to the kidney under fluoroscopic guidance.   A 6x26 JJ stent was then passed up the wire  under fluoroscopic guidance into the left  kidney with a good curl noted in the kidney and in the bladder.   The bladder was drained with a 50fr foley catheter that was left in place.     The patient was placed back supine, awakened from general anesthesia and brought to recovery room in stable condition.     ESTIMATED BLOOD LOSS: minimal   * No blood loss amount entered *   DRAINS:  6x26jj stent on the left. 9fr foley catheter  TOTAL IV FLUIDS:see anesthesia records  SPECIMENS:  Left renal pelvis urine for culture  IMPLANTS:  * No implants in log *   COMPLICATIONS: none  DISPOSITION: PACU - hemodynamically  stable.    [employer]

## 2017-05-24 ENCOUNTER — Encounter: Payer: Self-pay | Admitting: Urology

## 2017-05-24 DIAGNOSIS — N200 Calculus of kidney: Secondary | ICD-10-CM

## 2017-05-24 LAB — BASIC METABOLIC PANEL
ANION GAP: 8 (ref 5–15)
BUN: 26 mg/dL — ABNORMAL HIGH (ref 6–20)
CALCIUM: 8.2 mg/dL — AB (ref 8.9–10.3)
CO2: 23 mmol/L (ref 22–32)
Chloride: 106 mmol/L (ref 101–111)
Creatinine, Ser: 1.45 mg/dL — ABNORMAL HIGH (ref 0.61–1.24)
GFR calc non Af Amer: 41 mL/min — ABNORMAL LOW (ref >60)
GFR, EST AFRICAN AMERICAN: 47 mL/min — AB (ref >60)
Glucose, Bld: 120 mg/dL — ABNORMAL HIGH (ref 65–99)
Potassium: 4.4 mmol/L (ref 3.5–5.1)
Sodium: 137 mmol/L (ref 135–145)

## 2017-05-24 LAB — CBC
HCT: 38.4 % — ABNORMAL LOW (ref 40.0–52.0)
HEMOGLOBIN: 12.4 g/dL — AB (ref 13.0–18.0)
MCH: 26.5 pg (ref 26.0–34.0)
MCHC: 32.4 g/dL (ref 32.0–36.0)
MCV: 81.9 fL (ref 80.0–100.0)
PLATELETS: 225 10*3/uL (ref 150–440)
RBC: 4.69 MIL/uL (ref 4.40–5.90)
RDW: 15.4 % — ABNORMAL HIGH (ref 11.5–14.5)
WBC: 18.2 10*3/uL — AB (ref 3.8–10.6)

## 2017-05-24 MED ORDER — SODIUM CHLORIDE 0.9 % IV SOLN
1.0000 g | Freq: Two times a day (BID) | INTRAVENOUS | Status: DC
  Administered 2017-05-24 – 2017-05-26 (×5): 1 g via INTRAVENOUS
  Filled 2017-05-24 (×6): qty 1

## 2017-05-24 MED ORDER — RIVAROXABAN 10 MG PO TABS
10.0000 mg | ORAL_TABLET | Freq: Every day | ORAL | Status: DC
  Administered 2017-05-24 – 2017-05-26 (×3): 10 mg via ORAL
  Filled 2017-05-24 (×4): qty 1

## 2017-05-24 MED ORDER — SODIUM CHLORIDE 0.9 % IV SOLN
500.0000 mg | Freq: Three times a day (TID) | INTRAVENOUS | Status: DC
  Filled 2017-05-24 (×2): qty 0.5

## 2017-05-24 MED ORDER — CIPROFLOXACIN IN D5W 400 MG/200ML IV SOLN
400.0000 mg | INTRAVENOUS | Status: DC
  Filled 2017-05-24: qty 200

## 2017-05-24 NOTE — Anesthesia Postprocedure Evaluation (Signed)
Anesthesia Post Note  Patient: Matthew Hicks.  Procedure(s) Performed: Procedure(s) (LRB): CYSTOSCOPY WITH STENT PLACEMENT (Left)  Patient location during evaluation: PACU Anesthesia Type: General Level of consciousness: awake and alert Pain management: pain level controlled Vital Signs Assessment: post-procedure vital signs reviewed and stable Respiratory status: spontaneous breathing, nonlabored ventilation, respiratory function stable and patient connected to nasal cannula oxygen Cardiovascular status: blood pressure returned to baseline and stable Postop Assessment: no signs of nausea or vomiting Anesthetic complications: no     Last Vitals:  Vitals:   05/23/17 2335 05/24/17 0335  BP: (!) 123/55 121/66  Pulse: 79 80  Resp: 20 20  Temp: 36.7 C 37.2 C  SpO2: 99% 94%    Last Pain:  Vitals:   05/24/17 0335  TempSrc: Oral  PainSc:                  Martha Clan

## 2017-05-24 NOTE — Progress Notes (Signed)
Pharmacy Antibiotic Note  Matthew Hicks. is a 83 y.o. male admitted on 05/23/2017 with UTI/pylonephritis.  Pharmacy has been consulted for meropenem dosing.  Plan: Meropenem 1 gm IV Q12H  Height: 5\' 4"  (162.6 cm) Weight: 150 lb (68 kg) IBW/kg (Calculated) : 59.2  Temp (24hrs), Avg:98.8 F (37.1 C), Min:97.6 F (36.4 C), Max:101.1 F (38.4 C)   Recent Labs Lab 05/23/17 1543 05/24/17 0527  WBC 13.3* 18.2*  CREATININE 1.26* 1.45*  LATICACIDVEN 1.5  --     Estimated Creatinine Clearance: 28.4 mL/min (A) (by C-G formula based on SCr of 1.45 mg/dL (H)).    Allergies  Allergen Reactions  . Amoxicillin-Pot Clavulanate Swelling    angioedema  . Fesoterodine Fumarate Er Rash  . Oxycodone-Acetaminophen Rash  . Sulfa Antibiotics Rash    Thank you for allowing pharmacy to be a part of this patient's care.  Laural Benes, Pharm.D., BCPS Clinical Pharmacist 05/24/2017 12:33 PM

## 2017-05-24 NOTE — Progress Notes (Signed)
Ciprofloxacin dose modified to 400 mg IV Q24H d/t CrCl < 30 mL/min.   Izel Eisenhardt A. McGregor, [place].D., BCPS Clinical Pharmacist 05/24/2017 09:16

## 2017-05-24 NOTE — Plan of Care (Signed)
Dr. Text to inform that patient has temp 101.1 (tylenol given) and WBC climbing (18.2 today).

## 2017-05-24 NOTE — Progress Notes (Signed)
[facility] NAME: Matthew Hicks    MR#:  [id number]  DATE OF BIRTH:  12-Nov-1924  SUBJECTIVE:  CHIEF COMPLAINT:  Patient is resting comfortably. Hematuria improving denies any nausea vomiting or abdominal pain. 2 son's at bedside. Tolerating diet  REVIEW OF SYSTEMS:  CONSTITUTIONAL: No fever, fatigue or weakness.  EYES: No blurred or double vision.  EARS, NOSE, AND THROAT: No tinnitus or ear pain.  RESPIRATORY: No cough, shortness of breath, wheezing or hemoptysis.  CARDIOVASCULAR: No chest pain, orthopnea, edema.  GASTROINTESTINAL: No nausea, vomiting, diarrhea or abdominal pain.  GENITOURINARY: No dysuria, hematuria.  ENDOCRINE: No polyuria, nocturia,  HEMATOLOGY: No anemia, easy bruising or bleeding SKIN: No rash or lesion. MUSCULOSKELETAL: No joint pain or arthritis.   NEUROLOGIC: No tingling, numbness, weakness.  PSYCHIATRY: No anxiety or depression.   DRUG ALLERGIES:   Allergies  Allergen Reactions  . Amoxicillin-Pot Clavulanate Swelling    angioedema  . Fesoterodine Fumarate Er Rash  . Oxycodone-Acetaminophen Rash  . Sulfa Antibiotics Rash    VITALS:  Blood pressure (!) 112/51, pulse 73, temperature 98.1 F (36.7 C), temperature source Axillary, resp. rate 16, height 5\' 4"  (1.626 m), weight 68 kg (150 lb), SpO2 99 %.  PHYSICAL EXAMINATION:  GENERAL:  83 y.o.-year-old patient lying in the bed with no acute distress.  EYES: Pupils equal, round, reactive to light and accommodation. No scleral icterus. Extraocular muscles intact.  HEENT: Head atraumatic, normocephalic. Oropharynx and nasopharynx clear.  NECK:  Supple, no jugular venous distention. No thyroid enlargement, no tenderness.  LUNGS: Normal breath sounds bilaterally, no wheezing, rales,rhonchi or crepitation. No use of accessory muscles of respiration.  CARDIOVASCULAR: S1, S2 normal. No murmurs, rubs, or gallops.  ABDOMEN: Soft, nontender, nondistended.  Bowel sounds present. No organomegaly or mass.  EXTREMITIES: No pedal edema, cyanosis, or clubbing.  NEUROLOGIC: Cranial nerves II through XII are intact. Muscle strength 5/5 in all extremities. Sensation intact. Gait not checked.  PSYCHIATRIC: The patient is alert and oriented x 3.  SKIN: No obvious rash, lesion, or ulcer.    LABORATORY PANEL:   CBC  Recent Labs Lab 05/24/17 0527  WBC 18.2*  HGB 12.4*  HCT 38.4*  PLT 225   ------------------------------------------------------------------------------------------------------------------  Chemistries   Recent Labs Lab 05/23/17 1543 05/24/17 0527  NA 139 137  K 4.2 4.4  CL 104 106  CO2 26 23  GLUCOSE 139* 120*  BUN 25* 26*  CREATININE 1.26* 1.45*  CALCIUM 8.7* 8.2*  AST 20  --   ALT 14*  --   ALKPHOS 76  --   BILITOT 1.0  --    ------------------------------------------------------------------------------------------------------------------  Cardiac Enzymes  Recent Labs Lab 05/23/17 1543  TROPONINI <0.03   ------------------------------------------------------------------------------------------------------------------  RADIOLOGY:  Ct Angio Abd/pel W And/or Wo Contrast  Result Date: 05/23/2017 CLINICAL DATA:  83 year old male with history of left-sided flank pain, hematuria and vomiting today. History of prostate cancer. EXAM: CTA ABDOMEN AND PELVIS WITHOUT AND WITH CONTRAST TECHNIQUE: Multidetector CT imaging of the abdomen and pelvis was performed using the standard protocol during bolus administration of intravenous contrast. Multiplanar reconstructed images and MIPs were obtained and reviewed to evaluate the vascular anatomy. CONTRAST:  75 mL of Isovue 370. COMPARISON:  PET-CT 06/25/2015. CT the abdomen and pelvis 10/05/2007. FINDINGS: VASCULAR Aorta: Extensive atherosclerosis of the abdominal aorta with fusiform ectasia of the infrarenal abdominal aorta which measures up to 2.8 cm in diameter. Celiac: Patent  without evidence of aneurysm, dissection,  vasculitis or significant stenosis. SMA: Atherosclerotic plaque at the origin resulting in mild stenosis. No evidence of dissection or vasculitis. Renals: Single renal arteries bilaterally. Extensive atherosclerosis with mild ostial stenosis on the right and severe ostial stenosis on the left. IMA: Patent without evidence of aneurysm, dissection, vasculitis or significant stenosis. Inflow: Extensive atherosclerosis with aneurysmal dilatation of the right common iliac artery which measures up to 2.3 cm in diameter related to a large penetrating ulcer. Proximal Outflow: Bilateral common femoral and visualized portions of the superficial and profunda femoral arteries are patent without evidence of aneurysm, dissection, vasculitis or significant stenosis. Veins: No obvious venous abnormality within the limitations of this arterial phase study. Review of the MIP images confirms the above findings. NON-VASCULAR Lower chest: Cardiomegaly with severe right atrial dilatation. Calcifications of the mitral annulus. Atherosclerotic calcifications in the right coronary artery. Small chronic right pleural effusion with chronic rounded atelectasis in the right lower lobe and posterior aspect of the lateral segment of theright middle lobe. Hepatobiliary: No suspicious cystic or solid hepatic lesions. No intra or extrahepatic biliary ductal dilatation. Gallbladder is normal in appearance. Pancreas: No pancreatic mass. No pancreatic ductal dilatation. No pancreatic or peripancreatic fluid or inflammatory changes. Spleen: Unremarkable. Adrenals/Urinary Tract: 7 mm calculus at junction of proximal and middle third of the left ureter (axial image 96 of series 4) with mild proximal left hydroureteronephrosis and extensive left perinephric stranding. Delayed nephrogram on the left side related to obstruction. 1.6 cm simple cyst in the anterior aspect of the interpolar region of the right kidney.  No suspicious renal lesions are noted. No right-sided hydroureteronephrosis. Urinary bladder is normal in appearance. 2.4 x 2.1 cm left adrenal nodule is similar to multiple prior examinations, most compatible with an adenoma. However, the previously noted right adrenal nodule has progressively enlarged over serial prior examinations in currently measures 3.5 x 2.8 cm, concerning for possible metastatic lesion. Stomach/Bowel: The appearance of the stomach is normal. There is no pathologic dilatation of small bowel or colon. Numerous colonic diverticulae are noted, without surrounding inflammatory changes to suggest an acute diverticulitis at this time. Normal appendix. Lymphatic: No lymphadenopathy noted in the abdomen or pelvis. Reproductive: Brachytherapy implants throughout the prostate gland. Asymmetric enlargement of the right seminal vesicle, stable to prior studies dating back to 2008, presumably benign. Other: No significant volume of ascites.  No pneumoperitoneum. Musculoskeletal: There are no aggressive appearing lytic or blastic lesions noted in the visualized portions of the skeleton. IMPRESSION: VASCULAR 1. 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 No acute findings are noted. 2. Left anterior descending and right coronary artery disease. NON-VASCULAR 1. 7 mm calculus at the junction of proximal and middle third of the left ureter with mild left-sided hydroureteronephrosis and extensive left-sided perinephric stranding indicative of obstruction. This is likely the source of the patient's symptoms. 2. Colonic diverticulosis without evidence of acute diverticulitis at this time. 3. Slow progressive enlargement of a right adrenal mass which is incompletely characterized but currently measures 3.5 x 2.8 cm, concerning for a metastatic  lesion or primary adrenal lesion. This could be further evaluated with followup nonemergent adrenal protocol CT scan. 4. Left adrenal nodule is stable compared to numerous prior examinations, previously characterized as an adenoma. 5. Cardiomegaly with severe right atrial dilatation. 6. Small chronic right pleural effusion with areas of rounded atelectasis in the right  lung base, similar to prior studies. 7. Additional incidental findings, as above. Aortic Atherosclerosis (ICD10-I70.0). Electronically Signed   By: Vinnie Langton M.D.   On: 05/23/2017 18:57    EKG:   Orders placed or performed during the hospital encounter of 05/23/17  . ED EKG  . ED EKG    ASSESSMENT AND PLAN:   83 year old male with past medical history of prostate cancer, previous history of nephrolithiasis, hypertension, hyperlipidemia, GERD, previous history of CVA, aortic stenosis, chronic atrial fibrillation who presents to the hospital due to hematuria.  1. Nephrolithiasis-Etiology of left flank pain along with hematuria. -Urology did cystoscopy with stent placement/stone extraction August 12 -Continue supportive care with IV fluids,  and pain control. -Patient being still febrile ciprofloxacin is discontinued and broadened the antibiotic coverage with meropenem, discussed with urology  2. Hematuria-secondary to #1. -Clinically improving -Hemoglobin stable. Xarelto resumed  3. History of chronic atrial fibrillation-rate controlled. -Xarelto resumed  4. Hyperlipidemia-continue atorvastatin.  5. GERD-continue Pepcid.   6. Acute kidney injury from nephrolithiasis monitor renal function IV fluids avoid nephrotoxins, supportive treatment    All the records are reviewed and case discussed with Care Management/Social Workerr. Management plans discussed with the patient, family and they are in agreement.  CODE STATUS: fc   TOTAL TIME TAKING CARE OF THIS PATIENT: 36 minutes.   POSSIBLE D/C IN 2  DAYS,  DEPENDING ON CLINICAL CONDITION.  Note: This dictation was prepared with Dragon dictation along with smaller phrase technology. Any transcriptional errors that result from this process are unintentional.   Nicholes Mango M.D on 05/24/2017 at 4:39 PM  Between 7am to 6pm - Pager - [phone] After 6pm go to [email or web address] - password [email or web address] [facility]  [facility]  Office  [phone]  CC: Primary care physician; Kirk Ruths, MD

## 2017-05-24 NOTE — Progress Notes (Signed)
Urology Consult Follow Up  Subjective: Febrile up to 101. WBC and creatinine rising slightly since yesterday despite stent and Foley. Antibiotics broadened to meropenem.  Anti-infectives: Anti-infectives    Start     Dose/Rate Route Frequency Ordered Stop   05/24/17 1800  ciprofloxacin (CIPRO) IVPB 400 mg  Status:  Discontinued     400 mg 200 mL/hr over 60 Minutes Intravenous Every 24 hours 05/24/17 0916 05/24/17 1147   05/24/17 1400  meropenem (MERREM) 500 mg in sodium chloride 0.9 % 50 mL IVPB  Status:  Discontinued     500 mg 100 mL/hr over 30 Minutes Intravenous Every 8 hours 05/24/17 1147 05/24/17 1233   05/24/17 1245  meropenem (MERREM) 1 g in sodium chloride 0.9 % 100 mL IVPB     1 g 200 mL/hr over 30 Minutes Intravenous Every 12 hours 05/24/17 1233     05/24/17 0730  ciprofloxacin (CIPRO) IVPB 400 mg  Status:  Discontinued     400 mg 200 mL/hr over 60 Minutes Intravenous Every 12 hours 05/23/17 2006 05/24/17 0916   05/23/17 1915  ciprofloxacin (CIPRO) IVPB 400 mg     400 mg 200 mL/hr over 60 Minutes Intravenous  Once 05/23/17 1909 05/23/17 2030      Current Facility-Administered Medications  Medication Dose Route Frequency Provider Last Rate Last Dose  . 0.9 %  sodium chloride infusion   Intravenous Continuous Henreitta Leber, MD 100 mL/hr at 05/24/17 0508    . acetaminophen (TYLENOL) tablet 650 mg  650 mg Oral Q6H PRN Henreitta Leber, MD   650 mg at 05/24/17 1002   Or  . acetaminophen (TYLENOL) suppository 650 mg  650 mg Rectal Q6H PRN Henreitta Leber, MD      . atorvastatin (LIPITOR) tablet 20 mg  20 mg Oral Daily Henreitta Leber, MD   20 mg at 05/24/17 1002  . famotidine (PEPCID) tablet 20 mg  20 mg Oral BID Henreitta Leber, MD   20 mg at 05/24/17 1002  . furosemide (LASIX) tablet 20 mg  20 mg Oral Daily Henreitta Leber, MD   20 mg at 05/24/17 1002  . meropenem (MERREM) 1 g in sodium chloride 0.9 % 100 mL IVPB  1 g Intravenous Q12H Gouru, Aruna, MD 200 mL/hr at  05/24/17 1414 1 g at 05/24/17 1414  . multivitamin-lutein (OCUVITE-LUTEIN) capsule   Oral Daily Henreitta Leber, MD   1 capsule at 05/24/17 1004  . ondansetron (ZOFRAN) tablet 4 mg  4 mg Oral Q6H PRN Henreitta Leber, MD       Or  . ondansetron (ZOFRAN) injection 4 mg  4 mg Intravenous Q6H PRN Henreitta Leber, MD      . rivaroxaban (XARELTO) tablet 10 mg  10 mg Oral Q supper Gouru, Aruna, MD      . vitamin B-12 (CYANOCOBALAMIN) tablet 5,000 mcg  5,000 mcg Oral Daily Henreitta Leber, MD   5,000 mcg at 05/24/17 1002     Objective: Vital signs in last 24 hours: Temp:  [97.8 F (36.6 C)-101.1 F (38.4 C)] 98.1 F (36.7 C) (08/13 1323) Pulse Rate:  [51-84] 73 (08/13 1323) Resp:  [15-25] 16 (08/13 1322) BP: (112-125)/(51-66) 112/51 (08/13 1322) SpO2:  [92 %-99 %] 99 % (08/13 1323)  Intake/Output from previous day: 08/12 0701 - 08/13 0700 In: 830 [I.V.:830] Out: 375 [Urine:375] Intake/Output this shift: Total I/O In: 990 [P.O.:990] Out: 575 [Urine:575]   Physical Exam  Constitutional: He is oriented  to person, place, and time and well-developed, well-nourished, and in no distress.  HENT:  Head: Normocephalic and atraumatic.  Very hard of hearing  Eyes: Pupils are equal, round, and reactive to light.  Abdominal: Soft. Bowel sounds are normal.  Genitourinary:  Genitourinary Comments: Foley in place draining light pink urine  Neurological: He is alert and oriented to person, place, and time.  Skin: Skin is warm.  Psychiatric: Affect normal.  Vitals reviewed.   Lab Results:   Recent Labs  05/23/17 1543 05/24/17 0527  WBC 13.3* 18.2*  HGB 13.0 12.4*  HCT 39.7* 38.4*  PLT 253 225   BMET  Recent Labs  05/23/17 1543 05/24/17 0527  NA 139 137  K 4.2 4.4  CL 104 106  CO2 26 23  GLUCOSE 139* 120*  BUN 25* 26*  CREATININE 1.26* 1.45*  CALCIUM 8.7* 8.2*   PT/INR  Recent Labs  05/23/17 1543  LABPROT 13.7  INR 1.05   ABG No results for input(s): PHART,  HCO3 in the last 72 hours.  Invalid input(s): PCO2, PO2  Studies/Results: Ct Angio Abd/pel W And/or Wo Contrast  Result Date: 05/23/2017 CLINICAL DATA:  83 year old male with history of left-sided flank pain, hematuria and vomiting today. History of prostate cancer. EXAM: CTA ABDOMEN AND PELVIS WITHOUT AND WITH CONTRAST TECHNIQUE: Multidetector CT imaging of the abdomen and pelvis was performed using the standard protocol during bolus administration of intravenous contrast. Multiplanar reconstructed images and MIPs were obtained and reviewed to evaluate the vascular anatomy. CONTRAST:  75 mL of Isovue 370. COMPARISON:  PET-CT 06/25/2015. CT the abdomen and pelvis 10/05/2007. FINDINGS: VASCULAR Aorta: Extensive atherosclerosis of the abdominal aorta with fusiform ectasia of the infrarenal abdominal aorta which measures up to 2.8 cm in diameter. Celiac: Patent without evidence of aneurysm, dissection, vasculitis or significant stenosis. SMA: Atherosclerotic plaque at the origin resulting in mild stenosis. No evidence of dissection or vasculitis. Renals: Single renal arteries bilaterally. Extensive atherosclerosis with mild ostial stenosis on the right and severe ostial stenosis on the left. IMA: Patent without evidence of aneurysm, dissection, vasculitis or significant stenosis. Inflow: Extensive atherosclerosis with aneurysmal dilatation of the right common iliac artery which measures up to 2.3 cm in diameter related to a large penetrating ulcer. Proximal Outflow: Bilateral common femoral and visualized portions of the superficial and profunda femoral arteries are patent without evidence of aneurysm, dissection, vasculitis or significant stenosis. Veins: No obvious venous abnormality within the limitations of this arterial phase study. Review of the MIP images confirms the above findings. NON-VASCULAR Lower chest: Cardiomegaly with severe right atrial dilatation. Calcifications of the mitral annulus.  Atherosclerotic calcifications in the right coronary artery. Small chronic right pleural effusion with chronic rounded atelectasis in the right lower lobe and posterior aspect of the lateral segment of theright middle lobe. Hepatobiliary: No suspicious cystic or solid hepatic lesions. No intra or extrahepatic biliary ductal dilatation. Gallbladder is normal in appearance. Pancreas: No pancreatic mass. No pancreatic ductal dilatation. No pancreatic or peripancreatic fluid or inflammatory changes. Spleen: Unremarkable. Adrenals/Urinary Tract: 7 mm calculus at junction of proximal and middle third of the left ureter (axial image 96 of series 4) with mild proximal left hydroureteronephrosis and extensive left perinephric stranding. Delayed nephrogram on the left side related to obstruction. 1.6 cm simple cyst in the anterior aspect of the interpolar region of the right kidney. No suspicious renal lesions are noted. No right-sided hydroureteronephrosis. Urinary bladder is normal in appearance. 2.4 x 2.1 cm left adrenal nodule is  similar to multiple prior examinations, most compatible with an adenoma. However, the previously noted right adrenal nodule has progressively enlarged over serial prior examinations in currently measures 3.5 x 2.8 cm, concerning for possible metastatic lesion. Stomach/Bowel: The appearance of the stomach is normal. There is no pathologic dilatation of small bowel or colon. Numerous colonic diverticulae are noted, without surrounding inflammatory changes to suggest an acute diverticulitis at this time. Normal appendix. Lymphatic: No lymphadenopathy noted in the abdomen or pelvis. Reproductive: Brachytherapy implants throughout the prostate gland. Asymmetric enlargement of the right seminal vesicle, stable to prior studies dating back to 2008, presumably benign. Other: No significant volume of ascites.  No pneumoperitoneum. Musculoskeletal: There are no aggressive appearing lytic or blastic lesions  noted in the visualized portions of the skeleton. IMPRESSION: VASCULAR 1. 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 No acute findings are noted. 2. Left anterior descending and right coronary artery disease. NON-VASCULAR 1. 7 mm calculus at the junction of proximal and middle third of the left ureter with mild left-sided hydroureteronephrosis and extensive left-sided perinephric stranding indicative of obstruction. This is likely the source of the patient's symptoms. 2. Colonic diverticulosis without evidence of acute diverticulitis at this time. 3. Slow progressive enlargement of a right adrenal mass which is incompletely characterized but currently measures 3.5 x 2.8 cm, concerning for a metastatic lesion or primary adrenal lesion. This could be further evaluated with followup nonemergent adrenal protocol CT scan. 4. Left adrenal nodule is stable compared to numerous prior examinations, previously characterized as an adenoma. 5. Cardiomegaly with severe right atrial dilatation. 6. Small chronic right pleural effusion with areas of rounded atelectasis in the right lung base, similar to prior studies. 7. Additional incidental findings, as above. Aortic Atherosclerosis (ICD10-I70.0). Electronically Signed   By: Vinnie Langton M.D.   On: 05/23/2017 18:57     Assessment/ Plan:  83 year old male with obstructing 7 mm calculus postop day 1 status post left ureteral stent placement in the setting of infection.  Urine cultures pending.  Creatinine and white count rising.  Recommend continued supportive care, agree with antibiotic broadening.  Discussed follow-up plan for stone with patient. He is a patient of Dr. Eliberto Ivory therefore we will sign off.  As a courtesy, please let Dr. Eliberto Ivory know that his patient is in-house and has had a  procedure in order to plan for definitive management of the stone.  Mr. Shanahan is agreeable with this plan.    Hollice Espy, MD     LOS: 1 day

## 2017-05-25 LAB — CBC WITH DIFFERENTIAL/PLATELET
Basophils Absolute: 0 10*3/uL (ref 0–0.1)
Basophils Relative: 0 %
Eosinophils Absolute: 0 10*3/uL (ref 0–0.7)
Eosinophils Relative: 0 %
HEMATOCRIT: 40 % (ref 40.0–52.0)
HEMOGLOBIN: 13.1 g/dL (ref 13.0–18.0)
LYMPHS ABS: 0.3 10*3/uL — AB (ref 1.0–3.6)
LYMPHS PCT: 3 %
MCH: 27.3 pg (ref 26.0–34.0)
MCHC: 32.8 g/dL (ref 32.0–36.0)
MCV: 83.4 fL (ref 80.0–100.0)
Monocytes Absolute: 0.7 10*3/uL (ref 0.2–1.0)
Monocytes Relative: 7 %
NEUTROS ABS: 8.9 10*3/uL — AB (ref 1.4–6.5)
NEUTROS PCT: 90 %
Platelets: 195 10*3/uL (ref 150–440)
RBC: 4.8 MIL/uL (ref 4.40–5.90)
RDW: 15.6 % — ABNORMAL HIGH (ref 11.5–14.5)
WBC: 9.9 10*3/uL (ref 3.8–10.6)

## 2017-05-25 LAB — BASIC METABOLIC PANEL
ANION GAP: 5 (ref 5–15)
BUN: 24 mg/dL — AB (ref 6–20)
CALCIUM: 7.9 mg/dL — AB (ref 8.9–10.3)
CO2: 28 mmol/L (ref 22–32)
CREATININE: 1.67 mg/dL — AB (ref 0.61–1.24)
Chloride: 105 mmol/L (ref 101–111)
GFR calc Af Amer: 40 mL/min — ABNORMAL LOW (ref >60)
GFR, EST NON AFRICAN AMERICAN: 34 mL/min — AB (ref >60)
GLUCOSE: 89 mg/dL (ref 65–99)
Potassium: 4 mmol/L (ref 3.5–5.1)
Sodium: 138 mmol/L (ref 135–145)

## 2017-05-25 MED ORDER — FAMOTIDINE 20 MG PO TABS
20.0000 mg | ORAL_TABLET | Freq: Every day | ORAL | Status: DC
  Administered 2017-05-26 – 2017-05-27 (×2): 20 mg via ORAL
  Filled 2017-05-25 (×2): qty 1

## 2017-05-25 NOTE — Progress Notes (Signed)
RN notified Dr. Eliberto Ivory that Matthew Hicks is in house and had a procedure done. Verbal order to discontinue the foley. RN informed Matthew Hicks and Matthew Hicks.'s family that the doctor will be around today to see Matthew Hicks.   Pietrina Jagodzinski [employer]

## 2017-05-25 NOTE — Progress Notes (Signed)
[facility] NAME: Matthew Hicks    MR#:  [id number]  DATE OF BIRTH:  05-30-1925  SUBJECTIVE:  CHIEF COMPLAINT:  Patient is resting comfortably. Afebrile Hematuria improving denies any nausea vomiting or abdominal pain. Tolerating diet  REVIEW OF SYSTEMS:  CONSTITUTIONAL: No fever, fatigue or weakness.  EYES: No blurred or double vision.  EARS, NOSE, AND THROAT: No tinnitus or ear pain.  RESPIRATORY: No cough, shortness of breath, wheezing or hemoptysis.  CARDIOVASCULAR: No chest pain, orthopnea, edema.  GASTROINTESTINAL: No nausea, vomiting, diarrhea or abdominal pain.  GENITOURINARY: No dysuria, hematuria.  ENDOCRINE: No polyuria, nocturia,  HEMATOLOGY: No anemia, easy bruising or bleeding SKIN: No rash or lesion. MUSCULOSKELETAL: No joint pain or arthritis.   NEUROLOGIC: No tingling, numbness, weakness.  PSYCHIATRY: No anxiety or depression.   DRUG ALLERGIES:   Allergies  Allergen Reactions  . Amoxicillin-Pot Clavulanate Swelling    angioedema  . Fesoterodine Fumarate Er Rash  . Oxycodone-Acetaminophen Rash  . Sulfa Antibiotics Rash    VITALS:  Blood pressure 136/78, pulse (!) 112, temperature 99.8 F (37.7 C), temperature source Oral, resp. rate 16, height 5\' 4"  (1.626 m), weight 68 kg (150 lb), SpO2 95 %.  PHYSICAL EXAMINATION:  GENERAL:  83 y.o.-year-old patient lying in the bed with no acute distress.  EYES: Pupils equal, round, reactive to light and accommodation. No scleral icterus. Extraocular muscles intact.  HEENT: Head atraumatic, normocephalic. Oropharynx and nasopharynx clear.  NECK:  Supple, no jugular venous distention. No thyroid enlargement, no tenderness.  LUNGS: Normal breath sounds bilaterally, no wheezing, rales,rhonchi or crepitation. No use of accessory muscles of respiration.  CARDIOVASCULAR: S1, S2 normal. No murmurs, rubs, or gallops.  ABDOMEN: Soft, nontender, nondistended. Bowel sounds  present. No organomegaly or mass.  EXTREMITIES: No pedal edema, cyanosis, or clubbing.  NEUROLOGIC: Cranial nerves II through XII are intact. Muscle strength 5/5 in all extremities. Sensation intact. Gait not checked.  PSYCHIATRIC: The patient is alert and oriented x 3.  SKIN: No obvious rash, lesion, or ulcer.    LABORATORY PANEL:   CBC  Recent Labs Lab 05/25/17 0407  WBC 9.9  HGB 13.1  HCT 40.0  PLT 195   ------------------------------------------------------------------------------------------------------------------  Chemistries   Recent Labs Lab 05/23/17 1543  05/25/17 0407  NA 139  < > 138  K 4.2  < > 4.0  CL 104  < > 105  CO2 26  < > 28  GLUCOSE 139*  < > 89  BUN 25*  < > 24*  CREATININE 1.26*  < > 1.67*  CALCIUM 8.7*  < > 7.9*  AST 20  --   --   ALT 14*  --   --   ALKPHOS 76  --   --   BILITOT 1.0  --   --   < > = values in this interval not displayed. ------------------------------------------------------------------------------------------------------------------  Cardiac Enzymes  Recent Labs Lab 05/23/17 1543  TROPONINI <0.03   ------------------------------------------------------------------------------------------------------------------  RADIOLOGY:  Ct Angio Abd/pel W And/or Wo Contrast  Result Date: 05/23/2017 CLINICAL DATA:  83 year old male with history of left-sided flank pain, hematuria and vomiting today. History of prostate cancer. EXAM: CTA ABDOMEN AND PELVIS WITHOUT AND WITH CONTRAST TECHNIQUE: Multidetector CT imaging of the abdomen and pelvis was performed using the standard protocol during bolus administration of intravenous contrast. Multiplanar reconstructed images and MIPs were obtained and reviewed to evaluate the vascular anatomy. CONTRAST:  75 mL of Isovue 370. COMPARISON:  PET-CT 06/25/2015. CT the abdomen and pelvis 10/05/2007. FINDINGS: VASCULAR Aorta: Extensive atherosclerosis of the abdominal aorta with fusiform ectasia of  the infrarenal abdominal aorta which measures up to 2.8 cm in diameter. Celiac: Patent without evidence of aneurysm, dissection, vasculitis or significant stenosis. SMA: Atherosclerotic plaque at the origin resulting in mild stenosis. No evidence of dissection or vasculitis. Renals: Single renal arteries bilaterally. Extensive atherosclerosis with mild ostial stenosis on the right and severe ostial stenosis on the left. IMA: Patent without evidence of aneurysm, dissection, vasculitis or significant stenosis. Inflow: Extensive atherosclerosis with aneurysmal dilatation of the right common iliac artery which measures up to 2.3 cm in diameter related to a large penetrating ulcer. Proximal Outflow: Bilateral common femoral and visualized portions of the superficial and profunda femoral arteries are patent without evidence of aneurysm, dissection, vasculitis or significant stenosis. Veins: No obvious venous abnormality within the limitations of this arterial phase study. Review of the MIP images confirms the above findings. NON-VASCULAR Lower chest: Cardiomegaly with severe right atrial dilatation. Calcifications of the mitral annulus. Atherosclerotic calcifications in the right coronary artery. Small chronic right pleural effusion with chronic rounded atelectasis in the right lower lobe and posterior aspect of the lateral segment of theright middle lobe. Hepatobiliary: No suspicious cystic or solid hepatic lesions. No intra or extrahepatic biliary ductal dilatation. Gallbladder is normal in appearance. Pancreas: No pancreatic mass. No pancreatic ductal dilatation. No pancreatic or peripancreatic fluid or inflammatory changes. Spleen: Unremarkable. Adrenals/Urinary Tract: 7 mm calculus at junction of proximal and middle third of the left ureter (axial image 96 of series 4) with mild proximal left hydroureteronephrosis and extensive left perinephric stranding. Delayed nephrogram on the left side related to obstruction.  1.6 cm simple cyst in the anterior aspect of the interpolar region of the right kidney. No suspicious renal lesions are noted. No right-sided hydroureteronephrosis. Urinary bladder is normal in appearance. 2.4 x 2.1 cm left adrenal nodule is similar to multiple prior examinations, most compatible with an adenoma. However, the previously noted right adrenal nodule has progressively enlarged over serial prior examinations in currently measures 3.5 x 2.8 cm, concerning for possible metastatic lesion. Stomach/Bowel: The appearance of the stomach is normal. There is no pathologic dilatation of small bowel or colon. Numerous colonic diverticulae are noted, without surrounding inflammatory changes to suggest an acute diverticulitis at this time. Normal appendix. Lymphatic: No lymphadenopathy noted in the abdomen or pelvis. Reproductive: Brachytherapy implants throughout the prostate gland. Asymmetric enlargement of the right seminal vesicle, stable to prior studies dating back to 2008, presumably benign. Other: No significant volume of ascites.  No pneumoperitoneum. Musculoskeletal: There are no aggressive appearing lytic or blastic lesions noted in the visualized portions of the skeleton. IMPRESSION: VASCULAR 1. 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 No acute findings are noted. 2. Left anterior descending and right coronary artery disease. NON-VASCULAR 1. 7 mm calculus at the junction of proximal and middle third of the left ureter with mild left-sided hydroureteronephrosis and extensive left-sided perinephric stranding indicative of obstruction. This is likely the source of the patient's symptoms. 2. Colonic diverticulosis without evidence of acute diverticulitis at this time. 3. Slow progressive enlargement of a right adrenal mass which is  incompletely characterized but currently measures 3.5 x 2.8 cm, concerning for a metastatic lesion or primary adrenal lesion. This could be further evaluated with followup nonemergent  adrenal protocol CT scan. 4. Left adrenal nodule is stable compared to numerous prior examinations, previously characterized as an adenoma. 5. Cardiomegaly with severe right atrial dilatation. 6. Small chronic right pleural effusion with areas of rounded atelectasis in the right lung base, similar to prior studies. 7. Additional incidental findings, as above. Aortic Atherosclerosis (ICD10-I70.0). Electronically Signed   By: Vinnie Langton M.D.   On: 05/23/2017 18:57    EKG:   Orders placed or performed during the hospital encounter of 05/23/17  . ED EKG  . ED EKG    ASSESSMENT AND PLAN:   83 year old male with past medical history of prostate cancer, previous history of nephrolithiasis, hypertension, hyperlipidemia, GERD, previous history of CVA, aortic stenosis, chronic atrial fibrillation who presents to the hospital due to hematuria.  1. Nephrolithiasis-Etiology of left flank pain along with hematuria. -Urology did cystoscopy with stent placement/stone extraction August 12 -Continue supportive care with IV fluids,  and pain control. -Patient is seen and evaluated by primary urologist dr.Wolff, according to him patient's creatinine is hovering near baseline so patient can be discharged home tomorrow if clinically stable -Patient on meropenem, discussed with urology, outpatient follow-up is recommended -Urine culture with greater than 100,000 colonies of Proteus mirabilis -  2. Hematuria-secondary to #1. -Clinically improving -Hemoglobin stable. Xarelto resumed  3. History of chronic atrial fibrillation-rate controlled. -Xarelto resumed  4. Hyperlipidemia-continue atorvastatin.  5. GERD-continue Pepcid.   6. Acute kidney injury from nephrolithiasis monitor renal function IV fluids avoid  nephrotoxins, supportive treatment Creatinine 1.26-1.45-1.67   All the records are reviewed and case discussed with Care Management/Social Workerr. Management plans discussed with the patient, family and they are in agreement.  CODE STATUS: fc   TOTAL TIME TAKING CARE OF THIS PATIENT: 36 minutes.   POSSIBLE D/C IN 1  DAYS, DEPENDING ON CLINICAL CONDITION.  Note: This dictation was prepared with Dragon dictation along with smaller phrase technology. Any transcriptional errors that result from this process are unintentional.   Nicholes Mango M.D on 05/25/2017 at 3:14 PM  Between 7am to 6pm - Pager - [phone] After 6pm go to [email or web address] - password [email or web address] [facility]  [facility]  Office  [phone]  CC: Primary care physician; Kirk Ruths, MD

## 2017-05-25 NOTE — Progress Notes (Signed)
Verbal order from prime doc for telemonitoring sitter due to pt having frequent urgency to urinate. Pt is a high fall risk. Pt and pt.'s family educated on the telemontior.   Jamel Holzmann [employer]

## 2017-05-25 NOTE — Progress Notes (Signed)
Patient catheter leaking at entry site saturating bed with blood and urine. Minimal fluid in catheter tubing. No discomfort for patient. MD notified. Orders given. Catheter irrigated. Small blood clots noted. Catheter returned to proper flow. Will continue to monitor.

## 2017-05-25 NOTE — Progress Notes (Signed)
2 Days Post-Op Subjective: Patient reports no flank pain and has been voiding well since catheter removal.  Objective: Vital signs in last 24 hours: Temp:  [97.4 F (36.3 C)-100.5 F (38.1 C)] 97.4 F (36.3 C) (08/14 0610) Pulse Rate:  [73-93] 74 (08/14 0610) Resp:  [16-20] 16 (08/14 0610) BP: (112-141)/(51-83) 138/83 (08/14 0610) SpO2:  [93 %-99 %] 95 % (08/14 0610)  Intake/Output from previous day: 08/13 0701 - 08/14 0700 In: 2630 [P.O.:1230; I.V.:1200; IV Piggyback:200] Out: 9509 [Urine:1775] Intake/Output this shift: Total I/O In: 585 [P.O.:420; I.V.:165] Out: 450 [Urine:450]  Physical Exam:  General: Alert and oriented 3 GI: Abdomen soft. No CVA tenderness.   Lab Results:  Recent Labs  05/23/17 1543 05/24/17 0527 05/25/17 0407  HGB 13.0 12.4* 13.1  HCT 39.7* 38.4* 40.0   BMET  Recent Labs  05/24/17 0527 05/25/17 0407  NA 137 138  K 4.4 4.0  CL 106 105  CO2 23 28  GLUCOSE 120* 89  BUN 26* 24*  CREATININE 1.45* 1.67*  CALCIUM 8.2* 7.9*    Recent Labs  05/23/17 1543  INR 1.05   No results for input(s): LABURIN in the last 72 hours. Results for orders placed or performed during the hospital encounter of 05/23/17  Urine Culture     Status: Abnormal (Preliminary result)   Collection Time: 05/23/17  3:43 PM  Result Value Ref Range Status   Specimen Description URINE, CLEAN CATCH  Final   Special Requests Normal  Final   Culture (A)  Final    60,000 COLONIES/mL PROTEUS MIRABILIS SUSCEPTIBILITIES TO FOLLOW Performed at [facility], [place] [address], [place], [place] [address]    Report Status PENDING  Incomplete  Urine Culture     Status: Abnormal (Preliminary result)   Collection Time: 05/23/17  8:56 PM  Result Value Ref Range Status   Specimen Description URINE, RANDOM  Final   Special Requests CYSTOSCOPY LEFT RENAL PELVIS  Final   Culture (A)  Final    >=100,000 COLONIES/mL PROTEUS MIRABILIS SUSCEPTIBILITIES TO FOLLOW Performed at  [facility], [address] [place], [place] [address]    Report Status PENDING  Incomplete    Studies/Results: Ct Angio Abd/pel W And/or Wo Contrast  Result Date: 05/23/2017 CLINICAL DATA:  83 year old male with history of left-sided flank pain, hematuria and vomiting today. History of prostate cancer. EXAM: CTA ABDOMEN AND PELVIS WITHOUT AND WITH CONTRAST TECHNIQUE: Multidetector CT imaging of the abdomen and pelvis was performed using the standard protocol during bolus administration of intravenous contrast. Multiplanar reconstructed images and MIPs were obtained and reviewed to evaluate the vascular anatomy. CONTRAST:  75 mL of Isovue 370. COMPARISON:  PET-CT 06/25/2015. CT the abdomen and pelvis 10/05/2007. FINDINGS: VASCULAR Aorta: Extensive atherosclerosis of the abdominal aorta with fusiform ectasia of the infrarenal abdominal aorta which measures up to 2.8 cm in diameter. Celiac: Patent without evidence of aneurysm, dissection, vasculitis or significant stenosis. SMA: Atherosclerotic plaque at the origin resulting in mild stenosis. No evidence of dissection or vasculitis. Renals: Single renal arteries bilaterally. Extensive atherosclerosis with mild ostial stenosis on the right and severe ostial stenosis on the left. IMA: Patent without evidence of aneurysm, dissection, vasculitis or significant stenosis. Inflow: Extensive atherosclerosis with aneurysmal dilatation of the right common iliac artery which measures up to 2.3 cm in diameter related to a large penetrating ulcer. Proximal Outflow: Bilateral common femoral and visualized portions of the superficial and profunda femoral arteries are patent without evidence of aneurysm,  dissection, vasculitis or significant stenosis. Veins: No obvious venous abnormality within the limitations of this arterial phase study. Review of the MIP images confirms the above findings. NON-VASCULAR Lower chest: Cardiomegaly with severe right atrial dilatation.  Calcifications of the mitral annulus. Atherosclerotic calcifications in the right coronary artery. Small chronic right pleural effusion with chronic rounded atelectasis in the right lower lobe and posterior aspect of the lateral segment of theright middle lobe. Hepatobiliary: No suspicious cystic or solid hepatic lesions. No intra or extrahepatic biliary ductal dilatation. Gallbladder is normal in appearance. Pancreas: No pancreatic mass. No pancreatic ductal dilatation. No pancreatic or peripancreatic fluid or inflammatory changes. Spleen: Unremarkable. Adrenals/Urinary Tract: 7 mm calculus at junction of proximal and middle third of the left ureter (axial image 96 of series 4) with mild proximal left hydroureteronephrosis and extensive left perinephric stranding. Delayed nephrogram on the left side related to obstruction. 1.6 cm simple cyst in the anterior aspect of the interpolar region of the right kidney. No suspicious renal lesions are noted. No right-sided hydroureteronephrosis. Urinary bladder is normal in appearance. 2.4 x 2.1 cm left adrenal nodule is similar to multiple prior examinations, most compatible with an adenoma. However, the previously noted right adrenal nodule has progressively enlarged over serial prior examinations in currently measures 3.5 x 2.8 cm, concerning for possible metastatic lesion. Stomach/Bowel: The appearance of the stomach is normal. There is no pathologic dilatation of small bowel or colon. Numerous colonic diverticulae are noted, without surrounding inflammatory changes to suggest an acute diverticulitis at this time. Normal appendix. Lymphatic: No lymphadenopathy noted in the abdomen or pelvis. Reproductive: Brachytherapy implants throughout the prostate gland. Asymmetric enlargement of the right seminal vesicle, stable to prior studies dating back to 2008, presumably benign. Other: No significant volume of ascites.  No pneumoperitoneum. Musculoskeletal: There are no  aggressive appearing lytic or blastic lesions noted in the visualized portions of the skeleton. IMPRESSION: VASCULAR 1. 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 No acute findings are noted. 2. Left anterior descending and right coronary artery disease. NON-VASCULAR 1. 7 mm calculus at the junction of proximal and middle third of the left ureter with mild left-sided hydroureteronephrosis and extensive left-sided perinephric stranding indicative of obstruction. This is likely the source of the patient's symptoms. 2. Colonic diverticulosis without evidence of acute diverticulitis at this time. 3. Slow progressive enlargement of a right adrenal mass which is incompletely characterized but currently measures 3.5 x 2.8 cm, concerning for a metastatic lesion or primary adrenal lesion. This could be further evaluated with followup nonemergent adrenal protocol CT scan. 4. Left adrenal nodule is stable compared to numerous prior examinations, previously characterized as an adenoma. 5. Cardiomegaly with severe right atrial dilatation. 6. Small chronic right pleural effusion with areas of rounded atelectasis in the right lung base, similar to prior studies. 7. Additional incidental findings, as above. Aortic Atherosclerosis (ICD10-I70.0). Electronically Signed   By: Vinnie Langton M.D.   On: 05/23/2017 18:57    Assessment/Plan:  Left ureterolithiasis status post stent placement  Prostate cancer status post brachytherapy 2008 with recent PSA recurrence  Plan:  Creatinine is hovering near baseline so patient may be discharged home tomorrow. Patient was instructed to follow-up in the office next week and after 1 week of antibiotics will consider stone intervention.   Dontasia Miranda R 05/25/2017, 12:58 PM

## 2017-05-26 LAB — CBC WITH DIFFERENTIAL/PLATELET
BASOS ABS: 0 10*3/uL (ref 0–0.1)
BASOS PCT: 0 %
EOS ABS: 0.1 10*3/uL (ref 0–0.7)
EOS PCT: 1 %
HCT: 37.8 % — ABNORMAL LOW (ref 40.0–52.0)
Hemoglobin: 12.6 g/dL — ABNORMAL LOW (ref 13.0–18.0)
LYMPHS ABS: 0.4 10*3/uL — AB (ref 1.0–3.6)
Lymphocytes Relative: 4 %
MCH: 27.1 pg (ref 26.0–34.0)
MCHC: 33.4 g/dL (ref 32.0–36.0)
MCV: 81 fL (ref 80.0–100.0)
MONO ABS: 0.8 10*3/uL (ref 0.2–1.0)
Monocytes Relative: 9 %
Neutro Abs: 7.1 10*3/uL — ABNORMAL HIGH (ref 1.4–6.5)
Neutrophils Relative %: 86 %
PLATELETS: 189 10*3/uL (ref 150–440)
RBC: 4.67 MIL/uL (ref 4.40–5.90)
RDW: 15 % — AB (ref 11.5–14.5)
WBC: 8.3 10*3/uL (ref 3.8–10.6)

## 2017-05-26 LAB — BASIC METABOLIC PANEL
ANION GAP: 6 (ref 5–15)
BUN: 24 mg/dL — ABNORMAL HIGH (ref 6–20)
CALCIUM: 7.6 mg/dL — AB (ref 8.9–10.3)
CO2: 25 mmol/L (ref 22–32)
Chloride: 105 mmol/L (ref 101–111)
Creatinine, Ser: 1.43 mg/dL — ABNORMAL HIGH (ref 0.61–1.24)
GFR, EST AFRICAN AMERICAN: 48 mL/min — AB (ref >60)
GFR, EST NON AFRICAN AMERICAN: 42 mL/min — AB (ref >60)
GLUCOSE: 98 mg/dL (ref 65–99)
Potassium: 3.5 mmol/L (ref 3.5–5.1)
SODIUM: 136 mmol/L (ref 135–145)

## 2017-05-26 LAB — URINE CULTURE: Special Requests: NORMAL

## 2017-05-26 MED ORDER — CIPROFLOXACIN HCL 500 MG PO TABS
500.0000 mg | ORAL_TABLET | Freq: Two times a day (BID) | ORAL | Status: DC
  Administered 2017-05-26 – 2017-05-27 (×2): 500 mg via ORAL
  Filled 2017-05-26 (×2): qty 1

## 2017-05-26 NOTE — Progress Notes (Signed)
While rounding unit [facility] visited with pt. Pt was lying on the bed at the time of this visit. Pt talked about his wife, his doctor, his daughter who lost her job and now lives at home with them. Pt stated that he has not been able to eat for a while and was looking forward to have a decent meal soon. Upon pt's request, [facility] offered prayers and a ministry or presence to pt. [place] to follow up with pt as needed.   05/26/17 1500  Clinical Encounter Type  Visited With Patient  Visit Type Initial  Referral From [facility];Other (Comment)

## 2017-05-26 NOTE — Progress Notes (Signed)
[facility] NAME: Matthew Hicks    MR#:  [id number]  DATE OF BIRTH:  10/01/1925  SUBJECTIVE:  CHIEF COMPLAINT:  Patient is resting comfortably. Febrile last night Hematuria improving denies any nausea vomiting or abdominal pain. Tolerating diet  REVIEW OF SYSTEMS:  CONSTITUTIONAL: No fever, fatigue or weakness.  EYES: No blurred or double vision.  EARS, NOSE, AND THROAT: No tinnitus or ear pain.  RESPIRATORY: No cough, shortness of breath, wheezing or hemoptysis.  CARDIOVASCULAR: No chest pain, orthopnea, edema.  GASTROINTESTINAL: No nausea, vomiting, diarrhea or abdominal pain.  GENITOURINARY: No dysuria, hematuria.  ENDOCRINE: No polyuria, nocturia,  HEMATOLOGY: No anemia, easy bruising or bleeding SKIN: No rash or lesion. MUSCULOSKELETAL: No joint pain or arthritis.   NEUROLOGIC: No tingling, numbness, weakness.  PSYCHIATRY: No anxiety or depression.   DRUG ALLERGIES:   Allergies  Allergen Reactions  . Amoxicillin-Pot Clavulanate Swelling    angioedema  . Fesoterodine Fumarate Er Rash  . Oxycodone-Acetaminophen Rash  . Sulfa Antibiotics Rash    VITALS:  Blood pressure 138/74, pulse 75, temperature 98 F (36.7 C), temperature source Oral, resp. rate 18, height 5\' 4"  (1.626 m), weight 68 kg (150 lb), SpO2 98 %.  PHYSICAL EXAMINATION:  GENERAL:  83 y.o.-year-old patient lying in the bed with no acute distress.  EYES: Pupils equal, round, reactive to light and accommodation. No scleral icterus. Extraocular muscles intact.  HEENT: Head atraumatic, normocephalic. Oropharynx and nasopharynx clear.  NECK:  Supple, no jugular venous distention. No thyroid enlargement, no tenderness.  LUNGS: Normal breath sounds bilaterally, no wheezing, rales,rhonchi or crepitation. No use of accessory muscles of respiration.  CARDIOVASCULAR: S1, S2 normal. No murmurs, rubs, or gallops.  ABDOMEN: Soft, nontender, nondistended. Bowel  sounds present. No organomegaly or mass.  EXTREMITIES: No pedal edema, cyanosis, or clubbing.  NEUROLOGIC: Cranial nerves II through XII are intact. Muscle strength 5/5 in all extremities. Sensation intact. Gait not checked.  PSYCHIATRIC: The patient is alert and oriented x 3.  SKIN: No obvious rash, lesion, or ulcer.    LABORATORY PANEL:   CBC  Recent Labs Lab 05/26/17 0322  WBC 8.3  HGB 12.6*  HCT 37.8*  PLT 189   ------------------------------------------------------------------------------------------------------------------  Chemistries   Recent Labs Lab 05/23/17 1543  05/26/17 0322  NA 139  < > 136  K 4.2  < > 3.5  CL 104  < > 105  CO2 26  < > 25  GLUCOSE 139*  < > 98  BUN 25*  < > 24*  CREATININE 1.26*  < > 1.43*  CALCIUM 8.7*  < > 7.6*  AST 20  --   --   ALT 14*  --   --   ALKPHOS 76  --   --   BILITOT 1.0  --   --   < > = values in this interval not displayed. ------------------------------------------------------------------------------------------------------------------  Cardiac Enzymes  Recent Labs Lab 05/23/17 1543  TROPONINI <0.03   ------------------------------------------------------------------------------------------------------------------  RADIOLOGY:  No results found.  EKG:   Orders placed or performed during the hospital encounter of 05/23/17  . ED EKG  . ED EKG    ASSESSMENT AND PLAN:   83 year old male with past medical history of prostate cancer, previous history of nephrolithiasis, hypertension, hyperlipidemia, GERD, previous history of CVA, aortic stenosis, chronic atrial fibrillation who presents to the hospital due to hematuria.  1. Nephrolithiasis-Etiology of left flank pain along with hematuria. -Urology did cystoscopy with stent  placement/stone extraction August 12 -Continue supportive care with IV fluids,  and pain control. -Patient is seen and evaluated by primary urologist dr.Wolff, according to him patient's  creatinine is hovering near baseline so patient can be discharged home tomorrow if clinically stable -Patient on meropenem, discontinue meropenem,discussed with urology, outpatient follow-up is recommended -Urine culture with greater than 100,000 colonies of Proteus mirabilis,sensitive to ciprofloxacin and change antibiotics to by mouth Cipro -  2. Hematuria-secondary to #1. -Clinically improving -Hemoglobin stable. Xarelto resumed  3. History of chronic atrial fibrillation-rate controlled. -Xarelto resumed  4. Hyperlipidemia-continue atorvastatin.  5. GERD-continue Pepcid.   6. Acute kidney injury from nephrolithiasis monitor renal function IV fluids avoid nephrotoxins, supportive treatment Creatinine 1.26-1.45-1.67--1.43  Physical therapy is recommending home health PT  All the records are reviewed and case discussed with Care Management/Social Workerr. Management plans discussed with the patient, family and they are in agreement.  CODE STATUS: fc   TOTAL TIME TAKING CARE OF THIS PATIENT: 36 minutes.   POSSIBLE D/C IN 1  DAYS, DEPENDING ON CLINICAL CONDITION.  Note: This dictation was prepared with Dragon dictation along with smaller phrase technology. Any transcriptional errors that result from this process are unintentional.   Nicholes Mango M.D on 05/26/2017 at 2:31 PM  Between 7am to 6pm - Pager - [phone] After 6pm go to [email or web address] - password [email or web address] [facility]  [facility]  Office  [phone]  CC: Primary care physician; Kirk Ruths, MD

## 2017-05-26 NOTE — Care Management Important Message (Signed)
Important Message  Patient Details  Name: Matthew Hicks. MRN: [id number] Date of Birth: [birth year]   Medicare Important Message Given:  Yes    Beverly Sessions, RN 05/26/2017, 4:30 PM

## 2017-05-26 NOTE — Evaluation (Signed)
Physical Therapy Evaluation Patient Details Name: Matthew Hicks. MRN: [id number] DOB: Mar 10, 1925 Today's Date: 05/26/2017   History of Present Illness  Pt is a 83 yo M, admitted to acute care on 8/12 with dx of L sided kidney stone. Pt underwent L cystoscopy with stent placement on 8/12. Prior to admission, pt modI with ADL's and IADL's, amb with SPC outside of the home and furniture walking within the home. PMH: A-fib, aortic stenosis, cardiomopathy, cataract, CVA, GERD, hearing loss, HLD, HTN, lung mass, macular degeneration, nephrolithiasism pernicious anemia, pna, prostate cancer, skin cancer, and spinal stenosis.   Clinical Impression  Pt is pleasant and willing to participate for return to PLOF. Pt up in chair upon arrival; bed mobility not performed. Tranfers and ambulation performed with minA. Pt amb total of 160 ft with B UE support from RW; pt SOB and reports exercised related fatigue at end of session. Pt presents with the following deficits: strength, balance, and endurance. Overall, pt responded well to today's treatment with no adverse affects. Pt would benefit from skilled PT to address the previously mentioned impairments and promote return to PLOF. Currently recommending HHPT, pending d/c, as pt is with good family support.     Follow Up Recommendations Home health PT    Equipment Recommendations  None recommended by PT    Recommendations for Other Services       Precautions / Restrictions Precautions Precautions: Fall Restrictions Weight Bearing Restrictions: No      Mobility  Bed Mobility               General bed mobility comments: Pt up in chair upon arrival; not assessed this session.   Transfers Overall transfer level: Needs assistance Equipment used: Rolling walker (2 wheeled) Transfers: Sit to/from Stand Sit to Stand: Min assist         General transfer comment: (P) MinA with STS, requiring min cues for mechancis and safety. Pt with multiple  attempts STS, as nursing assisted with changing pt diaper and gown. Good carryover, but difficult to communicate pt education, as pt HOH. May need reinforcement.   Ambulation/Gait Ambulation/Gait assistance: Min assist Ambulation Distance (Feet): 160 Feet Assistive device: Rolling walker (2 wheeled) Gait Pattern/deviations: Step-through pattern;Decreased step length - right;Decreased step length - left     General Gait Details: Pt amb total of 160 ft with good reciprocal gait pattern. Pt education re: correct mechancis and safety with using RW for UE support during a,mb. Pt with generally flexed posture and tendency to look at floor.   Stairs            Wheelchair Mobility    Modified Rankin (Stroke Patients Only)       Balance Overall balance assessment: Needs assistance Sitting-balance support: Bilateral upper extremity supported;Feet supported Sitting balance-Leahy Scale: Good Sitting balance - Comments: Pt with good sitting balance, requiring no cues for mechancis or safety.    Standing balance support: Bilateral upper extremity supported Standing balance-Leahy Scale: Fair Standing balance comment: Pt with fair standing balance, requiring B UE support from RW. With no UE support, pt requires CGA to prevent LOB.                              Pertinent Vitals/Pain Pain Assessment: No/denies pain    Home Living Family/patient expects to be discharged to:: Private residence Living Arrangements: Spouse/significant other;Children Available Help at Discharge: Family Type of Home: House Home Access: Stairs  to enter Entrance Stairs-Rails: Right;Left;Can reach both Entrance Stairs-Number of Steps: 2 Home Layout: Able to live on main level with bedroom/bathroom Home Equipment: Walker - 2 wheels;Cane - single point      Prior Function Level of Independence: Independent with assistive device(s)         Comments: Pt 2 sons present and assisted in obtaining  hx, because pt is HOH. Per sons report, they live at the end of the pt's driveway and the pt is currently living with his wife, daughter, and grandson, who can all provide assist. At baseline, pt modI with ADL's/IADL's, amb with SPC in community and amb by furniture walking in the home. Pt driving prior to admission.      Hand Dominance        Extremity/Trunk Assessment   Upper Extremity Assessment Upper Extremity Assessment: Overall WFL for tasks assessed    Lower Extremity Assessment Lower Extremity Assessment: Generalized weakness (MMT to B LE's grossly 4/5. )       Communication   Communication: HOH  Cognition Arousal/Alertness: Awake/alert Behavior During Therapy: WFL for tasks assessed/performed Overall Cognitive Status: Within Functional Limits for tasks assessed                                        General Comments      Exercises Other Exercises Other Exercises: Seated therex performed with supervision to B LE's x10 reps: marches and LAQ's   Assessment/Plan    PT Assessment Patient needs continued PT services  PT Problem List Decreased strength;Decreased activity tolerance;Decreased balance;Decreased mobility;Decreased coordination;Decreased knowledge of precautions       PT Treatment Interventions DME instruction;Gait training;Stair training;Functional mobility training;Therapeutic activities;Therapeutic exercise;Balance training;Neuromuscular re-education;Patient/family education;Manual techniques    PT Goals (Current goals can be found in the Care Plan section)  Acute Rehab PT Goals Patient Stated Goal: ro go home PT Goal Formulation: With patient/family Time For Goal Achievement: 06/09/17 Potential to Achieve Goals: Good    Frequency Min 2X/week   Barriers to discharge        Co-evaluation               AM-PAC PT "6 Clicks" Daily Activity  Outcome Measure Difficulty turning over in bed (including adjusting bedclothes,  sheets and blankets)?: Total Difficulty moving from lying on back to sitting on the side of the bed? : Total Difficulty sitting down on and standing up from a chair with arms (e.g., wheelchair, bedside commode, etc,.)?: Total Help needed moving to and from a bed to chair (including a wheelchair)?: A Little Help needed walking in hospital room?: A Little Help needed climbing 3-5 steps with a railing? : A Lot 6 Click Score: 11    End of Session Equipment Utilized During Treatment: Gait belt Activity Tolerance: Patient tolerated treatment well Patient left: in chair;with call bell/phone within reach;with chair alarm set Nurse Communication: Mobility status PT Visit Diagnosis: Unsteadiness on feet (R26.81);Other abnormalities of gait and mobility (R26.89);Muscle weakness (generalized) (M62.81)    Time: 8546-2703 PT Time Calculation (min) (ACUTE ONLY): 32 min   Charges:         PT G Codes:        Oran Rein PT, SPT  Bevelyn Ngo 05/26/2017, 12:41 PM

## 2017-05-27 LAB — BASIC METABOLIC PANEL
ANION GAP: 7 (ref 5–15)
BUN: 27 mg/dL — ABNORMAL HIGH (ref 6–20)
CALCIUM: 7.5 mg/dL — AB (ref 8.9–10.3)
CHLORIDE: 105 mmol/L (ref 101–111)
CO2: 26 mmol/L (ref 22–32)
CREATININE: 1.32 mg/dL — AB (ref 0.61–1.24)
GFR calc non Af Amer: 46 mL/min — ABNORMAL LOW (ref >60)
GFR, EST AFRICAN AMERICAN: 53 mL/min — AB (ref >60)
GLUCOSE: 107 mg/dL — AB (ref 65–99)
Potassium: 3.4 mmol/L — ABNORMAL LOW (ref 3.5–5.1)
Sodium: 138 mmol/L (ref 135–145)

## 2017-05-27 LAB — CBC WITH DIFFERENTIAL/PLATELET
BASOS PCT: 0 %
Basophils Absolute: 0 10*3/uL (ref 0–0.1)
Eosinophils Absolute: 0.2 10*3/uL (ref 0–0.7)
Eosinophils Relative: 2 %
HEMATOCRIT: 38.2 % — AB (ref 40.0–52.0)
HEMOGLOBIN: 12.6 g/dL — AB (ref 13.0–18.0)
LYMPHS ABS: 0.6 10*3/uL — AB (ref 1.0–3.6)
Lymphocytes Relative: 7 %
MCH: 26.6 pg (ref 26.0–34.0)
MCHC: 33 g/dL (ref 32.0–36.0)
MCV: 80.8 fL (ref 80.0–100.0)
MONO ABS: 1.1 10*3/uL — AB (ref 0.2–1.0)
MONOS PCT: 13 %
NEUTROS ABS: 6.6 10*3/uL — AB (ref 1.4–6.5)
NEUTROS PCT: 78 %
Platelets: 206 10*3/uL (ref 150–440)
RBC: 4.73 MIL/uL (ref 4.40–5.90)
RDW: 15.1 % — AB (ref 11.5–14.5)
WBC: 8.5 10*3/uL (ref 3.8–10.6)

## 2017-05-27 MED ORDER — CIPROFLOXACIN HCL 500 MG PO TABS: 500.0000 mg | ORAL_TABLET | Freq: Two times a day (BID) | ORAL | 0 refills | Status: DC

## 2017-05-27 MED ORDER — ACETAMINOPHEN 325 MG PO TABS: 650.0000 mg | ORAL_TABLET | Freq: Four times a day (QID) | ORAL | Status: DC | PRN

## 2017-05-27 MED ORDER — POTASSIUM CHLORIDE CRYS ER 20 MEQ PO TBCR
40.0000 meq | EXTENDED_RELEASE_TABLET | ORAL | Status: DC
  Administered 2017-05-27: 40 meq via ORAL

## 2017-05-27 MED ORDER — POTASSIUM CHLORIDE CRYS ER 20 MEQ PO TBCR
EXTENDED_RELEASE_TABLET | ORAL | Status: AC
  Administered 2017-05-27: 09:00:00
  Filled 2017-05-27: qty 2

## 2017-05-27 NOTE — Discharge Summary (Signed)
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 A-fib ([place]) 04/08/2014  . Aortic stenosis   . Cardiomyopathy ([place])   . Cataract   . CVA (cerebral infarction) 2003  . GERD (gastroesophageal reflux disease)   . Hearing loss   . Hyperlipidemia   . Hypertension 05/04/2014  . Lung mass   . Macular degeneration   . Nephrolithiasis   . Pernicious anemia   . Pneumonia 05/2015  . Prostate cancer [facility]) 2007   performed by Dr. Eliberto Ivory  . Skin cancer   . Spinal stenosis     HOSPITAL COURSE:   1. Nephrolithiasis and a cystitis-Etiology of left flank pain along with hematuria. -Urology did cystoscopy with stent placement/stone extraction August 12 -Continue supportive care with IV fluids,  and pain control. -Patient is seen and evaluated by primary urologist dr.Wolff, according to him patient's creatinine is hovering near baseline so patient can be discharged home tomorrow if clinically stable -Patient on meropenem, discontinue meropenem,discussed with urology, outpatient follow-up is recommended -Urine culture with greater than 100,000 colonies of Proteus mirabilis,sensitive to ciprofloxacin and change antibiotics to by mouth Cipro -  2. Hematuria-secondary to #1. -Clinically improving -Hemoglobin stable. Xarelto resumed  3. History of chronic atrial fibrillation-rate controlled. -Xarelto resumed  4. Hyperlipidemia-continue atorvastatin.  5. GERD-continue  Pepcid.   6. Acute kidney injury from nephrolithiasis monitor renal function IV fluids avoid nephrotoxins, supportive treatment Creatinine 1.26-1.45-1.67--1.43 - 1.32   Physical therapy is recommending home health PT  DISCHARGE CONDITIONS:   Stable  CONSULTS OBTAINED:  Treatment Team:  Heron Sabins, MD Hollice Espy, MD Royston Cowper, MD   PROCEDURES  cystoscopy with stent placement/stone extraction August 12   DRUG ALLERGIES:   Allergies  Allergen Reactions  . Amoxicillin-Pot Clavulanate Swelling    angioedema  . Fesoterodine Fumarate Er Rash  . Oxycodone-Acetaminophen Rash  . Sulfa Antibiotics Rash    DISCHARGE MEDICATIONS:   Current Discharge Medication List    START taking these medications   Details  acetaminophen (TYLENOL) 325 MG tablet Take 2 tablets (650 mg total) by mouth every 6 (six) hours as needed for mild pain (or Fever >/= 101).    ciprofloxacin (CIPRO) 500 MG tablet Take 1 tablet (500 mg total) by mouth 2 (two) times daily. Qty: 15 tablet, Refills: 0      CONTINUE these medications which have NOT CHANGED   Details  atorvastatin (LIPITOR) 20 MG tablet TAKE ONE TABLET BY MOUTH EVERY DAY    Cyanocobalamin (B-12) 5000 MCG CAPS Take 1 tablet by mouth daily.    furosemide (LASIX) 20 MG tablet Take 20 mg by mouth daily.     Multiple Vitamins-Minerals (ICAPS) CAPS Take 1 tablet by mouth daily.    ranitidine (ZANTAC) 300 MG capsule Take 300 mg by mouth daily.     rivaroxaban (XARELTO) 10 MG TABS tablet TAKE 1 TABLET(10 MG TOTAL) BY MOUTH DAILY WITH DINNER.    azelastine (ASTELIN) 0.1 % nasal spray  Place 2 sprays into the nose 1 day or 1 dose.         DISCHARGE INSTRUCTIONS:   Follow-up with primary care physician in a week Follow-up with urology Dr. Eliberto Ivory in a week  DIET:  Cardiac diet  DISCHARGE CONDITION:  Stable  ACTIVITY:  Activity as tolerated  OXYGEN:  Home Oxygen: No.   Oxygen Delivery: room air  DISCHARGE  LOCATION:  home With home health PT  If you experience worsening of your admission symptoms, develop shortness of breath, life threatening emergency, suicidal or homicidal thoughts you must seek medical attention immediately by calling 911 or calling your MD immediately  if symptoms less severe.  You Must read complete instructions/literature along with all the possible adverse reactions/side effects for all the Medicines you take and that have been prescribed to you. Take any new Medicines after you have completely understood and accpet all the possible adverse reactions/side effects.   Please note  You were cared for by a hospitalist during your hospital stay. If you have any questions about your discharge medications or the care you received while you were in the hospital after you are discharged, you can call the unit and asked to speak with the hospitalist on call if the hospitalist that took care of you is not available. Once you are discharged, your primary care physician will handle any further medical issues. Please note that NO REFILLS for any discharge medications will be authorized once you are discharged, as it is imperative that you return to your primary care physician (or establish a relationship with a primary care physician if you do not have one) for your aftercare needs so that they can reassess your need for medications and monitor your lab values.     Today  Chief Complaint  Patient presents with  . Flank Pain   Patient is doing fine, no complaints, wants to go home, not requiring any pain medicine; 2 sons at bedside  ROS:  CONSTITUTIONAL: Denies fevers, chills. Denies any fatigue, weakness.  EYES: Denies blurry vision, double vision, eye pain. EARS, NOSE, THROAT: Denies tinnitus, ear pain, hearing loss. RESPIRATORY: Denies cough, wheeze, shortness of breath.  CARDIOVASCULAR: Denies chest pain, palpitations, edema.  GASTROINTESTINAL: Denies nausea, vomiting, diarrhea,  abdominal pain. Denies bright red blood per rectum. GENITOURINARY: Denies dysuria, hematuria. ENDOCRINE: Denies nocturia or thyroid problems. HEMATOLOGIC AND LYMPHATIC: Denies easy bruising or bleeding. SKIN: Denies rash or lesion. MUSCULOSKELETAL: Denies pain in neck, back, shoulder, knees, hips or arthritic symptoms.  NEUROLOGIC: Denies paralysis, paresthesias.  PSYCHIATRIC: Denies anxiety or depressive symptoms.   VITAL SIGNS:  Blood pressure (!) 141/88, pulse 82, temperature 98.8 F (37.1 C), temperature source Oral, resp. rate 18, height 5\' 4"  (1.626 m), weight 68 kg (150 lb), SpO2 97 %.  I/O:    Intake/Output Summary (Last 24 hours) at 05/27/17 1259 Last data filed at 05/27/17 1009  Gross per 24 hour  Intake             2575 ml  Output                0 ml  Net             2575 ml    PHYSICAL EXAMINATION:  GENERAL:  83 y.o.-year-old patient lying in the bed with no acute distress.  EYES: Pupils equal, round, reactive to light and accommodation. No scleral icterus. Extraocular muscles intact.  HEENT: Head atraumatic, normocephalic. Oropharynx and nasopharynx clear.  NECK:  Supple,  no jugular venous distention. No thyroid enlargement, no tenderness.  LUNGS: Normal breath sounds bilaterally, no wheezing, rales,rhonchi or crepitation. No use of accessory muscles of respiration.  CARDIOVASCULAR: S1, S2 normal. No murmurs, rubs, or gallops.  ABDOMEN: Soft, non-tender, non-distended. Bowel sounds present. No organomegaly or mass.  EXTREMITIES: No pedal edema, cyanosis, or clubbing.  NEUROLOGIC: Cranial nerves II through XII are intact. Muscle strength 5/5 in all extremities. Sensation intact. Gait not checked.  PSYCHIATRIC: The patient is alert and oriented x 3.  SKIN: No obvious rash, lesion, or ulcer.   DATA REVIEW:   CBC  Recent Labs Lab 05/27/17 0319  WBC 8.5  HGB 12.6*  HCT 38.2*  PLT 206    Chemistries   Recent Labs Lab 05/23/17 1543  05/27/17 0319  NA 139   < > 138  K 4.2  < > 3.4*  CL 104  < > 105  CO2 26  < > 26  GLUCOSE 139*  < > 107*  BUN 25*  < > 27*  CREATININE 1.26*  < > 1.32*  CALCIUM 8.7*  < > 7.5*  AST 20  --   --   ALT 14*  --   --   ALKPHOS 76  --   --   BILITOT 1.0  --   --   < > = values in this interval not displayed.  Cardiac Enzymes  Recent Labs Lab 05/23/17 1543  TROPONINI <0.03    Microbiology Results  Results for orders placed or performed during the hospital encounter of 05/23/17  Urine Culture     Status: Abnormal   Collection Time: 05/23/17  3:43 PM  Result Value Ref Range Status   Specimen Description URINE, CLEAN CATCH  Final   Special Requests Normal  Final   Culture 60,000 COLONIES/mL PROTEUS MIRABILIS (A)  Final   Report Status 05/26/2017 FINAL  Final   Organism ID, Bacteria PROTEUS MIRABILIS (A)  Final      Susceptibility   Proteus mirabilis - MIC*    AMPICILLIN <=2 SENSITIVE Sensitive     CEFAZOLIN <=4 SENSITIVE Sensitive     CEFTRIAXONE <=1 SENSITIVE Sensitive     CIPROFLOXACIN 1 SENSITIVE Sensitive     GENTAMICIN <=1 SENSITIVE Sensitive     IMIPENEM 4 SENSITIVE Sensitive     NITROFURANTOIN 128 RESISTANT Resistant     TRIMETH/SULFA <=20 SENSITIVE Sensitive     AMPICILLIN/SULBACTAM <=2 SENSITIVE Sensitive     PIP/TAZO <=4 SENSITIVE Sensitive     * 60,000 COLONIES/mL PROTEUS MIRABILIS  Urine Culture     Status: Abnormal   Collection Time: 05/23/17  8:56 PM  Result Value Ref Range Status   Specimen Description URINE, RANDOM  Final   Special Requests CYSTOSCOPY LEFT RENAL PELVIS  Final   Culture >=100,000 COLONIES/mL PROTEUS MIRABILIS (A)  Final   Report Status 05/26/2017 FINAL  Final   Organism ID, Bacteria PROTEUS MIRABILIS (A)  Final      Susceptibility   Proteus mirabilis - MIC*    AMPICILLIN <=2 SENSITIVE Sensitive     CEFAZOLIN <=4 SENSITIVE Sensitive     CEFTRIAXONE <=1 SENSITIVE Sensitive     CIPROFLOXACIN 1 SENSITIVE Sensitive     GENTAMICIN <=1 SENSITIVE Sensitive      IMIPENEM 4 SENSITIVE Sensitive     NITROFURANTOIN 128 RESISTANT Resistant     TRIMETH/SULFA <=20 SENSITIVE Sensitive     AMPICILLIN/SULBACTAM <=2 SENSITIVE Sensitive     PIP/TAZO <=4 SENSITIVE Sensitive     * >=  100,000 COLONIES/mL PROTEUS MIRABILIS    RADIOLOGY:  Ct Angio Abd/pel W And/or Wo Contrast  Result Date: 05/23/2017 CLINICAL DATA:  83 year old male with history of left-sided flank pain, hematuria and vomiting today. History of prostate cancer. EXAM: CTA ABDOMEN AND PELVIS WITHOUT AND WITH CONTRAST TECHNIQUE: Multidetector CT imaging of the abdomen and pelvis was performed using the standard protocol during bolus administration of intravenous contrast. Multiplanar reconstructed images and MIPs were obtained and reviewed to evaluate the vascular anatomy. CONTRAST:  75 mL of Isovue 370. COMPARISON:  PET-CT 06/25/2015. CT the abdomen and pelvis 10/05/2007. FINDINGS: VASCULAR Aorta: Extensive atherosclerosis of the abdominal aorta with fusiform ectasia of the infrarenal abdominal aorta which measures up to 2.8 cm in diameter. Celiac: Patent without evidence of aneurysm, dissection, vasculitis or significant stenosis. SMA: Atherosclerotic plaque at the origin resulting in mild stenosis. No evidence of dissection or vasculitis. Renals: Single renal arteries bilaterally. Extensive atherosclerosis with mild ostial stenosis on the right and severe ostial stenosis on the left. IMA: Patent without evidence of aneurysm, dissection, vasculitis or significant stenosis. Inflow: Extensive atherosclerosis with aneurysmal dilatation of the right common iliac artery which measures up to 2.3 cm in diameter related to a large penetrating ulcer. Proximal Outflow: Bilateral common femoral and visualized portions of the superficial and profunda femoral arteries are patent without evidence of aneurysm, dissection, vasculitis or significant stenosis. Veins: No obvious venous abnormality within the limitations of this  arterial phase study. Review of the MIP images confirms the above findings. NON-VASCULAR Lower chest: Cardiomegaly with severe right atrial dilatation. Calcifications of the mitral annulus. Atherosclerotic calcifications in the right coronary artery. Small chronic right pleural effusion with chronic rounded atelectasis in the right lower lobe and posterior aspect of the lateral segment of theright middle lobe. Hepatobiliary: No suspicious cystic or solid hepatic lesions. No intra or extrahepatic biliary ductal dilatation. Gallbladder is normal in appearance. Pancreas: No pancreatic mass. No pancreatic ductal dilatation. No pancreatic or peripancreatic fluid or inflammatory changes. Spleen: Unremarkable. Adrenals/Urinary Tract: 7 mm calculus at junction of proximal and middle third of the left ureter (axial image 96 of series 4) with mild proximal left hydroureteronephrosis and extensive left perinephric stranding. Delayed nephrogram on the left side related to obstruction. 1.6 cm simple cyst in the anterior aspect of the interpolar region of the right kidney. No suspicious renal lesions are noted. No right-sided hydroureteronephrosis. Urinary bladder is normal in appearance. 2.4 x 2.1 cm left adrenal nodule is similar to multiple prior examinations, most compatible with an adenoma. However, the previously noted right adrenal nodule has progressively enlarged over serial prior examinations in currently measures 3.5 x 2.8 cm, concerning for possible metastatic lesion. Stomach/Bowel: The appearance of the stomach is normal. There is no pathologic dilatation of small bowel or colon. Numerous colonic diverticulae are noted, without surrounding inflammatory changes to suggest an acute diverticulitis at this time. Normal appendix. Lymphatic: No lymphadenopathy noted in the abdomen or pelvis. Reproductive: Brachytherapy implants throughout the prostate gland. Asymmetric enlargement of the right seminal vesicle, stable to  prior studies dating back to 2008, presumably benign. Other: No significant volume of ascites.  No pneumoperitoneum. Musculoskeletal: There are no aggressive appearing lytic or blastic lesions noted in the visualized portions of the skeleton. IMPRESSION: VASCULAR 1. 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 No acute findings are noted. 2. Left anterior descending  and right coronary artery disease. NON-VASCULAR 1. 7 mm calculus at the junction of proximal and middle third of the left ureter with mild left-sided hydroureteronephrosis and extensive left-sided perinephric stranding indicative of obstruction. This is likely the source of the patient's symptoms. 2. Colonic diverticulosis without evidence of acute diverticulitis at this time. 3. Slow progressive enlargement of a right adrenal mass which is incompletely characterized but currently measures 3.5 x 2.8 cm, concerning for a metastatic lesion or primary adrenal lesion. This could be further evaluated with followup nonemergent adrenal protocol CT scan. 4. Left adrenal nodule is stable compared to numerous prior examinations, previously characterized as an adenoma. 5. Cardiomegaly with severe right atrial dilatation. 6. Small chronic right pleural effusion with areas of rounded atelectasis in the right lung base, similar to prior studies. 7. Additional incidental findings, as above. Aortic Atherosclerosis (ICD10-I70.0). Electronically Signed   By: Vinnie Langton M.D.   On: 05/23/2017 18:57    EKG:   Orders placed or performed during the hospital encounter of 05/23/17  . ED EKG  . ED EKG      Management plans discussed with the patient, family and they are in agreement.  CODE STATUS:     Code Status Orders        Start     Ordered   05/23/17 2214  Full code  Continuous      05/23/17 2213    Code Status History    Date Active Date Inactive Code Status Order ID Comments User Context   This patient has a current code status but no historical code status.      TOTAL TIME TAKING CARE OF THIS PATIENT: 45 minutes.   Note: This dictation was prepared with Dragon dictation along with smaller phrase technology. Any transcriptional errors that result from this process are unintentional.   @M[facility] @  on 05/27/2017 at 12:59 PM  Between 7am to 6pm - Pager - [phone]  After 6pm go to [email or web address] - password [email or web address] [facility]  [facility]  Office  [phone]  CC: Primary care physician; Kirk Ruths, MD

## 2017-05-27 NOTE — Progress Notes (Signed)
Pnt had an uneventful night. Pnt appears comfortable in no distress. Bed low and locked, call bell in reach.

## 2017-05-27 NOTE — Progress Notes (Signed)
Patient discharge teaching given, including activity, diet, follow-up appoints, and medications. Patient verbalized understanding of all discharge instructions. IV access was d/c'd. Vitals are stable. Skin is intact except as charted in most recent assessments. Pt to be escorted out by volunteer, to be driven home by family.  Magdala Brahmbhatt  

## 2017-05-27 NOTE — Discharge Instructions (Signed)
Follow-up with primary care physician in a week Follow-up with urology Dr. Eliberto Ivory in a week

## 2017-05-27 NOTE — Care Management (Signed)
Patient admitted from home with kidney stone.  Patient lives at home with wife.  Family lives locally for support. PCP Ouida Sills.  PT has assessed patient and recommends home health PT.  Patient agreeable to home health.  Patient provided home health agency preference.  Patient states that he does not have a preference. Referral made to [facility] with Alvis Lemmings for home health services.  Patient has a cane in the home, as well as access to a RW for ambulation.  RNCM signing off.

## 2017-06-04 ENCOUNTER — Other Ambulatory Visit: Payer: Self-pay | Admitting: Physician Assistant

## 2017-06-04 DIAGNOSIS — R93429 Abnormal radiologic findings on diagnostic imaging of unspecified kidney: Secondary | ICD-10-CM

## 2017-06-07 NOTE — H&P (Signed)
NAMEALISON, BREEDING NO.:  [id number]  MEDICAL RECORD NO.:  [id number]  LOCATION:  222A                         FACILITY:  [facility]  PHYSICIAN:  Maryan Puls          DATE OF BIRTH:  08/26/1925  DATE OF ADMISSION:  05/23/2017 DATE OF DISCHARGE:                            HISTORY AND PHYSICAL   SAME-DAY SURGERY:  June 15, 2017.  CHIEF COMPLAINT:  Kidney stone and ureteral stent.  HISTORY OF PRESENT ILLNESS:  Mr. Khader is a 83 year old white male who presented with left renal colic and underwent stent placement on August 12th.  The patient had a 7 mm left mid ureteral stone and underwent ureteral stent placement on August 12th.  Followup KUB revealed that he had a residual 2-3 mm stone in the region of the left upper ureter.  He comes in now for cystoscopy with stent retrieval.  PAST MEDICAL HISTORY:  The patient was allergic to Septra DS, Urogesic and Myrbetriq.  CURRENT MEDICATIONS:  Included, atorvastatin, ranitidine, vitamin B12, amiodarone, Lasix, Xarelto, __________.  PAST SURGICAL HISTORY: 1. Pilonidal cyst resection, 1960s. 2. Circumcision, 1994. 3. Cystolitholapaxy with TURP in 1996. 4. Photovaporization of prostate, 1996. 5. I-125 brachytherapy, 2008.  SOCIAL HISTORY:  The patient denied tobacco or alcohol use.  FAMILY HISTORY:  Significant for stroke.  REVIEW OF SYSTEMS: 1. Atrial fibrillation. 2. History of cataracts. 3. History of stroke, 2003. 4. Prostate cancer status post brachytherapy, 2008. 5. Overactive bladder.  REVIEW OF SYSTEMS:  The patient denies chest pain, shortness of breath, or diabetes.  PHYSICAL EXAMINATION:  GENERAL:  Elderly white male, in no acute distress. HEENT:  Sclerae were clear. NECK:  Supple.  No palpable cervical adenopathy. LUNGS:  Clear to auscultation. CARDIOVASCULAR:  Irregular rhythm. ABDOMINAL:  Soft, nontender abdomen. GU:  Circumcised.  Testes are atrophic. RECTAL:  A 20 g flat smooth  nontender prostate. NEUROMUSCULAR:  Alert and oriented x3.  IMPRESSION:  Left ureteral process status post stent placement.  PLAN:  Cystoscopy with stent retrieval.          ______________________________ Maryan Puls     MW/MEDQ  D:  06/07/2017  T:  06/07/2017  Job:  [id number]

## 2017-06-08 ENCOUNTER — Ambulatory Visit
Admission: RE | Admit: 2017-06-08 | Discharge: 2017-06-08 | Disposition: A | Discharge status: Home / Self Care | Payer: Medicare Other | Source: Ambulatory Visit | Attending: Orthopedic Surgery | Admitting: Orthopedic Surgery

## 2017-06-08 ENCOUNTER — Inpatient Hospital Stay: Admission: RE | Admit: 2017-06-08 | Payer: Medicare Other | Source: Ambulatory Visit

## 2017-06-08 DIAGNOSIS — N201 Calculus of ureter: Secondary | ICD-10-CM | POA: Insufficient documentation

## 2017-06-08 DIAGNOSIS — Z01818 Encounter for other preprocedural examination: Secondary | ICD-10-CM | POA: Insufficient documentation

## 2017-06-08 HISTORY — DX: Cerebral infarction, unspecified: I63.9

## 2017-06-08 HISTORY — DX: Cardiac arrhythmia, unspecified: I49.9

## 2017-06-08 NOTE — Patient Instructions (Signed)
  Your procedure is scheduled on:06/15/17 Report to Day Surgery. [facility] SECOND FLOOR To find out your arrival time please call [phone] between 1PM - 3PM on  06/11/17  Remember: Instructions that are not followed completely may result in serious medical risk, up to and including death, or upon the discretion of your surgeon and anesthesiologist your surgery may need to be rescheduled.    __X__ 1. Do not eat food after midnight the night before your procedure. No gum chewing or hard candies. You may drink clear liquids up to 2 hours before you are scheduled to arrive for your surgery- DO not drink clear liquids within 2 hours of the start of your surgery.  Clear Liquids include: water, apple juice without pulp, clear carbohydrate drink such as Clearfast of Gartorade, Black Coffee or Tea (Do not add anything to coffee or tea).    ____ 2. No Alcohol for 24 hours before or after surgery.   ____ 3. Do Not Smoke For 24 Hours Prior to Your Surgery.   ____ 4. Bring all medications with you on the day of surgery if instructed.    ____ 5. Notify your doctor if there is any change in your medical condition     (cold, fever, infections).       Do not wear jewelry, make-up, hairpins, clips or nail polish.  Do not wear lotions, powders, or perfumes. You may wear deodorant.  Do not shave 48 hours prior to surgery. Men may shave face and neck.  Do not bring valuables to the hospital.    [facility] is not responsible for any belongings or valuables.               Contacts, dentures or bridgework may not be worn into surgery.  Leave your suitcase in the car. After surgery it may be brought to your room.  For patients admitted to the hospital, discharge time is determined by your                treatment team.   Patients discharged the day of surgery will not be allowed to drive home.   __X__ Take these medicines the morning of surgery with A SIP OF WATER:    1.  AMIODARONE  2. RANITIDINE  3.   4.  5.  6.  ____ Fleet Enema (as directed)   ____ Use CHG Soap as directed  ____ Use inhalers on the day of surgery  ____ Stop metformin 2 days prior to surgery    ____ Take 1/2 of usual insulin dose the night before surgery and none on the morning of surgery.   __X__ Stop Coumadin/Plavix/aspirin on   CALL DR WOLFF'S OFFICE AT [phone] TO SEE WHEN TO STOP XARELTO ____ Stop Anti-inflammatories on    ____ Stop supplements until after surgery.    ____ Bring C-Pap to the hospital.

## 2017-06-11 ENCOUNTER — Ambulatory Visit
Admission: RE | Admit: 2017-06-11 | Discharge: 2017-06-11 | Disposition: A | Discharge status: Home / Self Care | Payer: Medicare Other | Source: Ambulatory Visit | Attending: Physician Assistant | Admitting: Physician Assistant

## 2017-06-11 DIAGNOSIS — R93429 Abnormal radiologic findings on diagnostic imaging of unspecified kidney: Secondary | ICD-10-CM | POA: Insufficient documentation

## 2017-06-11 DIAGNOSIS — Z87442 Personal history of urinary calculi: Secondary | ICD-10-CM | POA: Insufficient documentation

## 2017-06-11 DIAGNOSIS — E279 Disorder of adrenal gland, unspecified: Secondary | ICD-10-CM | POA: Insufficient documentation

## 2017-06-11 DIAGNOSIS — N200 Calculus of kidney: Secondary | ICD-10-CM | POA: Diagnosis present

## 2017-06-11 DIAGNOSIS — Z960 Presence of urogenital implants: Secondary | ICD-10-CM | POA: Diagnosis not present

## 2017-06-15 ENCOUNTER — Encounter
Admission: RE | Disposition: A | Discharge status: Home / Self Care | Payer: Self-pay | Source: Ambulatory Visit | Attending: Urology

## 2017-06-15 ENCOUNTER — Ambulatory Visit: Payer: Medicare Other | Admitting: Anesthesiology

## 2017-06-15 ENCOUNTER — Ambulatory Visit
Admission: RE | Admit: 2017-06-15 | Discharge: 2017-06-15 | Disposition: A | Discharge status: Home / Self Care | Payer: Medicare Other | Source: Ambulatory Visit | Attending: Urology | Admitting: Urology

## 2017-06-15 DIAGNOSIS — Z8673 Personal history of transient ischemic attack (TIA), and cerebral infarction without residual deficits: Secondary | ICD-10-CM | POA: Insufficient documentation

## 2017-06-15 DIAGNOSIS — I10 Essential (primary) hypertension: Secondary | ICD-10-CM | POA: Diagnosis not present

## 2017-06-15 DIAGNOSIS — I4891 Unspecified atrial fibrillation: Secondary | ICD-10-CM | POA: Insufficient documentation

## 2017-06-15 DIAGNOSIS — Z7902 Long term (current) use of antithrombotics/antiplatelets: Secondary | ICD-10-CM | POA: Insufficient documentation

## 2017-06-15 DIAGNOSIS — N201 Calculus of ureter: Secondary | ICD-10-CM

## 2017-06-15 DIAGNOSIS — Z79899 Other long term (current) drug therapy: Secondary | ICD-10-CM | POA: Insufficient documentation

## 2017-06-15 DIAGNOSIS — Z8546 Personal history of malignant neoplasm of prostate: Secondary | ICD-10-CM | POA: Diagnosis not present

## 2017-06-15 DIAGNOSIS — K219 Gastro-esophageal reflux disease without esophagitis: Secondary | ICD-10-CM | POA: Insufficient documentation

## 2017-06-15 DIAGNOSIS — Z87891 Personal history of nicotine dependence: Secondary | ICD-10-CM | POA: Insufficient documentation

## 2017-06-15 HISTORY — PX: CYSTOSCOPY W/ URETERAL STENT REMOVAL: SHX1430

## 2017-06-15 SURGERY — REMOVAL, STENT, URETER, CYSTOSCOPIC
Anesthesia: General | Laterality: Left | Wound class: Clean Contaminated

## 2017-06-15 MED ORDER — PROPOFOL 10 MG/ML IV BOLUS
INTRAVENOUS | Status: AC
  Filled 2017-06-15: qty 20

## 2017-06-15 MED ORDER — LIDOCAINE HCL 2 % EX GEL
CUTANEOUS | Status: DC | PRN
  Administered 2017-06-15: 1

## 2017-06-15 MED ORDER — ONDANSETRON 8 MG PO TBDP: 4.0000 mg | ORAL_TABLET | Freq: Four times a day (QID) | ORAL | 3 refills | Status: DC | PRN

## 2017-06-15 MED ORDER — LEVOFLOXACIN IN D5W 500 MG/100ML IV SOLN
INTRAVENOUS | Status: DC | PRN
  Administered 2017-06-15: 500 mg via INTRAVENOUS

## 2017-06-15 MED ORDER — ONDANSETRON HCL 4 MG/2ML IJ SOLN
INTRAMUSCULAR | Status: AC
  Filled 2017-06-15: qty 2

## 2017-06-15 MED ORDER — FENTANYL CITRATE (PF) 100 MCG/2ML IJ SOLN
INTRAMUSCULAR | Status: DC | PRN
  Administered 2017-06-15 (×2): 12.5 ug via INTRAVENOUS

## 2017-06-15 MED ORDER — LEVOFLOXACIN IN D5W 500 MG/100ML IV SOLN
INTRAVENOUS | Status: AC
  Filled 2017-06-15: qty 100

## 2017-06-15 MED ORDER — IOTHALAMATE MEGLUMINE 17.2 % UR SOLN
URETHRAL | Status: DC | PRN
  Administered 2017-06-15: 20 mL

## 2017-06-15 MED ORDER — LIDOCAINE HCL 2 % EX GEL
CUTANEOUS | Status: AC
Start: 2017-06-15 — End: ?
  Filled 2017-06-15: qty 10

## 2017-06-15 MED ORDER — FENTANYL CITRATE (PF) 100 MCG/2ML IJ SOLN
INTRAMUSCULAR | Status: AC
  Filled 2017-06-15: qty 2

## 2017-06-15 MED ORDER — LIDOCAINE HCL (PF) 2 % IJ SOLN
INTRAMUSCULAR | Status: AC
  Filled 2017-06-15: qty 2

## 2017-06-15 MED ORDER — FENTANYL CITRATE (PF) 100 MCG/2ML IJ SOLN: 25.0000 ug | INTRAMUSCULAR | Status: DC | PRN

## 2017-06-15 MED ORDER — PHENYLEPHRINE HCL 10 MG/ML IJ SOLN
INTRAMUSCULAR | Status: DC | PRN
  Administered 2017-06-15: 100 ug via INTRAVENOUS

## 2017-06-15 MED ORDER — PROPOFOL 10 MG/ML IV BOLUS
INTRAVENOUS | Status: DC | PRN
  Administered 2017-06-15 (×2): 20 mg via INTRAVENOUS
  Administered 2017-06-15: 50 mg via INTRAVENOUS

## 2017-06-15 MED ORDER — ONDANSETRON HCL 4 MG/2ML IJ SOLN: 4.0000 mg | Freq: Once | INTRAMUSCULAR | Status: DC | PRN

## 2017-06-15 MED ORDER — ONDANSETRON HCL 4 MG/2ML IJ SOLN
INTRAMUSCULAR | Status: DC | PRN
Start: 2017-06-15 — End: 2017-06-15
  Administered 2017-06-15: 4 mg via INTRAVENOUS

## 2017-06-15 MED ORDER — ACETAMINOPHEN-CODEINE #3 300-30 MG PO TABS: 1.0000 | ORAL_TABLET | ORAL | 0 refills | Status: DC | PRN

## 2017-06-15 MED ORDER — LACTATED RINGERS IV SOLN: INTRAVENOUS | Status: DC

## 2017-06-15 MED ORDER — LEVOFLOXACIN IN D5W 500 MG/100ML IV SOLN: 500.0000 mg | Freq: Once | INTRAVENOUS | Status: DC

## 2017-06-15 MED ORDER — LACTATED RINGERS IV SOLN
INTRAVENOUS | Status: DC | PRN
  Administered 2017-06-15: 15:00:00 via INTRAVENOUS

## 2017-06-15 SURGICAL SUPPLY — 23 items
BAG DRAIN CYSTO-URO LG1000N (MISCELLANEOUS) ×3 IMPLANT
BRUSH SCRUB EZ 1% IODOPHOR (MISCELLANEOUS) ×2 IMPLANT
CATH URETL OPEN END 6X70 (CATHETERS) ×3 IMPLANT
FIBER LASER 365 (Laser) ×2 IMPLANT
GLOVE BIO SURGEON STRL SZ7 (GLOVE) ×6 IMPLANT
GLOVE BIO SURGEON STRL SZ7.5 (GLOVE) ×3 IMPLANT
GLOVE BIOGEL PI IND STRL 7.0 (GLOVE) IMPLANT
GLOVE BIOGEL PI INDICATOR 7.0 (GLOVE) ×4
GOWN STRL REUS W/ TWL LRG LVL3 (GOWN DISPOSABLE) ×1 IMPLANT
GOWN STRL REUS W/ TWL XL LVL3 (GOWN DISPOSABLE) ×1 IMPLANT
GOWN STRL REUS W/TWL LRG LVL3 (GOWN DISPOSABLE) ×3
GOWN STRL REUS W/TWL XL LVL3 (GOWN DISPOSABLE) ×3
GUIDEWIRE STR ZIPWIRE 035X150 (MISCELLANEOUS) ×3 IMPLANT
PACK CYSTO AR (MISCELLANEOUS) ×3 IMPLANT
PREP PVP WINGED SPONGE (MISCELLANEOUS) ×3 IMPLANT
SET CYSTO W/LG BORE CLAMP LF (SET/KITS/TRAYS/PACK) ×3 IMPLANT
SOL .9 NS 3000ML IRR  AL (IV SOLUTION) ×2
SOL .9 NS 3000ML IRR AL (IV SOLUTION) ×1
SOL .9 NS 3000ML IRR UROMATIC (IV SOLUTION) ×1 IMPLANT
SOL PREP PVP 2OZ (MISCELLANEOUS) ×3
SOLUTION PREP PVP 2OZ (MISCELLANEOUS) ×1 IMPLANT
SURGILUBE 2OZ TUBE FLIPTOP (MISCELLANEOUS) ×3 IMPLANT
WATER STERILE IRR 1000ML POUR (IV SOLUTION) ×3 IMPLANT

## 2017-06-15 NOTE — Transfer of Care (Signed)
Immediate Anesthesia Transfer of Care Note  Patient: Matthew Hicks.  Procedure(s) Performed: Procedure(s): CYSTOSCOPY WITH STENT REMOVAL, RETROGRADE, AND HOLMIUM LASER (Left)  Patient Location: PACU  Anesthesia Type:General  Level of Consciousness: sedated  Airway & Oxygen Therapy: Patient Spontanous Breathing and Patient connected to face mask oxygen  Post-op Assessment: Report given to RN and Post -op Vital signs reviewed and stable  Post vital signs: Reviewed and stable  Last Vitals:  Vitals:   06/15/17 1320 06/15/17 1624  BP: (!) 146/78 (!) 143/81  Pulse: 73 (!) 52  Resp: 18 13  Temp: (!) 36.2 C (!) 36.2 C  SpO2: 100% 99%    Last Pain:  Vitals:   06/15/17 1320  TempSrc: Tympanic         Complications: No apparent anesthesia complications

## 2017-06-15 NOTE — H&P (Signed)
Date of Initial H&P: 06/07/17  History reviewed, patient examined, no change in status, stable for surgery.

## 2017-06-15 NOTE — Discharge Instructions (Addendum)
AMBULATORY SURGERY  DISCHARGE INSTRUCTIONS   1) The drugs that you were given will stay in your system until tomorrow so for the next 24 hours you should not:  A) Drive an automobile B) Make any legal decisions C) Drink any alcoholic beverage   2) You may resume regular meals tomorrow.  Today it is better to start with liquids and gradually work up to solid foods.  You may eat anything you prefer, but it is better to start with liquids, then soup and crackers, and gradually work up to solid foods.   3) Please notify your doctor immediately if you have any unusual bleeding, trouble breathing, redness and pain at the surgery site, drainage, fever, or pain not relieved by medication. 4)   5) Your post-operative visit with Dr.                                     is: Date:                        Time:    Please call to schedule your post-operative visit.  6) Additional Instructions:      Kidney Stones Kidney stones (urolithiasis) are rock-like masses that form inside of the kidneys. Kidneys are organs that make pee (urine). A kidney stone can cause very bad pain and can block the flow of pee. The stone usually leaves your body (passes) through your pee. You may need to have a doctor take out the stone. Follow these instructions at home: Eating and drinking  Drink enough fluid to keep your pee clear or pale yellow. This will help you pass the stone.  If told by your doctor, change the foods you eat (your diet). This may include: ? Limiting how much salt (sodium) you eat. ? Eating more fruits and vegetables. ? Limiting how much meat, poultry, fish, and eggs you eat.  Follow instructions from your doctor about eating or drinking restrictions. General instructions  Collect pee samples as told by your doctor. You may need to collect a pee sample: ? 24 hours after a stone comes out. ? 8-12 weeks after a stone comes out, and every 6-12 months after that.  Strain your pee every  time you pee (urinate), for as long as told. Use the strainer that your doctor recommends.  Do not throw out the stone. Keep it so that it can be tested by your doctor.  Take over-the-counter and prescription medicines only as told by your doctor.  Keep all follow-up visits as told by your doctor. This is important. You may need follow-up tests. Preventing kidney stones To prevent another kidney stone:  Drink enough fluid to keep your pee clear or pale yellow. This is the best way to prevent kidney stones.  Eat healthy foods.  Avoid certain foods as told by your doctor. You may be told to eat less protein.  Stay at a healthy weight.  Contact a doctor if:  You have pain that gets worse or does not get better with medicine. Get help right away if:  You have a fever or chills.  You get very bad pain.  You get new pain in your belly (abdomen).  You pass out (faint).  You cannot pee. This information is not intended to replace advice given to you by your health care provider. Make sure you discuss any questions you have with  your health care provider. Document Released: 03/16/2008 Document Revised: 06/16/2016 Document Reviewed: 06/16/2016 Elsevier Interactive Patient Education  2017 Else[employer]  Ureteroscopy, Care After This sheet gives you information about how to care for yourself after your procedure. Your health care provider may also give you more specific instructions. If you have problems or questions, contact your health care provider. What can I expect after the procedure? After the procedure, it is common to have:  A burning sensation when you urinate.  Blood in your urine.  Mild discomfort in the bladder area or kidney area when urinating.  Needing to urinate more often or urgently.  Follow these instructions at home: Medicines  Take over-the-counter and prescription medicines only as told by your health care provider.  If you were prescribed an  antibiotic medicine, take it as told by your health care provider. Do not stop taking the antibiotic even if you start to feel better. General instructions   Donot drive for 24 hours if you were given a medicine to help you relax (sedative) during your procedure.  To relieve burning, try taking a warm bath or holding a warm washcloth over your groin.  Drink enough fluid to keep your urine clear or pale yellow. ? Drink two 8-ounce glasses of water every hour for the first 2 hours after you get home. ? Continue to drink water often at home.  You can eat what you usually do.  Keep all follow-up visits as told by your health care provider. This is important. ? If you had a tube placed to keep urine flowing (ureteral stent), ask your health care provider when you need to return to have it removed. Contact a health care provider if:  You have chills or a fever.  You have burning pain for longer than 24 hours after the procedure.  You have blood in your urine for longer than 24 hours after the procedure. Get help right away if:  You have large amounts of blood in your urine.  You have blood clots in your urine.  You have very bad pain.  You have chest pain or trouble breathing.  You are unable to urinate and you have the feeling of a full bladder. This information is not intended to replace advice given to you by your health care provider. Make sure you discuss any questions you have with your health care provider. Document Released: 10/03/2013 Document Revised: 07/14/2016 Document Reviewed: 07/10/2016 Elsevier Interactive Patient Education  [employer].

## 2017-06-15 NOTE — Anesthesia Preprocedure Evaluation (Addendum)
Anesthesia Evaluation  Patient identified by MRN, date of birth, ID band Patient awake    Reviewed: Allergy & Precautions, NPO status , Patient's Chart, lab work & pertinent test results, reviewed documented beta blocker date and time   Airway Mallampati: II  TM Distance: >3 FB     Dental  (+) Chipped   Pulmonary pneumonia, resolved, former smoker,           Cardiovascular hypertension, + dysrhythmias Atrial Fibrillation      Neuro/Psych CVA    GI/Hepatic   Endo/Other    Renal/GU      Musculoskeletal   Abdominal   Peds  Hematology   Anesthesia Other Findings Past Medical History: 04/08/2014: A-fib ([place]) No date: Aortic stenosis No date: Cardiomyopathy [facility]) No date: Cataract 2003: CVA (cerebral infarction) No date: Dysrhythmia No date: GERD (gastroesophageal reflux disease) No date: Hearing loss No date: Hyperlipidemia 05/04/2014: Hypertension No date: Lung mass No date: Macular degeneration No date: Nephrolithiasis No date: Pernicious anemia 05/2015: Pneumonia 2007: Prostate cancer ([place])     Comment:  performed by Dr. Eliberto Ivory No date: Skin cancer No date: Spinal stenosis No date: Stroke [facility])   Reproductive/Obstetrics                            Anesthesia Physical Anesthesia Plan  ASA: III  Anesthesia Plan: General   Post-op Pain Management:    Induction: Intravenous  PONV Risk Score and Plan:   Airway Management Planned: LMA  Additional Equipment:   Intra-op Plan:   Post-operative Plan:   Informed Consent: I have reviewed the patients History and Physical, chart, labs and discussed the procedure including the risks, benefits and alternatives for the proposed anesthesia with the patient or authorized representative who has indicated his/her understanding and acceptance.     Plan Discussed with: CRNA  Anesthesia Plan Comments:        Anesthesia Quick  Evaluation

## 2017-06-15 NOTE — Anesthesia Post-op Follow-up Note (Signed)
Anesthesia QCDR form completed.        

## 2017-06-15 NOTE — Anesthesia Postprocedure Evaluation (Signed)
Anesthesia Post Note  Patient: Teodoro Jeffreys.  Procedure(s) Performed: Procedure(s) (LRB): CYSTOSCOPY WITH STENT REMOVAL, RETROGRADE, AND HOLMIUM LASER (Left)  Patient location during evaluation: PACU Anesthesia Type: General Level of consciousness: awake and alert Pain management: pain level controlled Vital Signs Assessment: post-procedure vital signs reviewed and stable Respiratory status: spontaneous breathing, nonlabored ventilation, respiratory function stable and patient connected to nasal cannula oxygen Cardiovascular status: blood pressure returned to baseline and stable Postop Assessment: no signs of nausea or vomiting Anesthetic complications: no     Last Vitals:  Vitals:   06/15/17 1710 06/15/17 1730  BP: (!) 161/75 117/82  Pulse: 69   Resp: 16   Temp: (!) 36.3 C   SpO2: 98%     Last Pain:  Vitals:   06/15/17 1710  TempSrc: Temporal  PainSc:                  Precious Haws Palak Tercero

## 2017-06-15 NOTE — Op Note (Signed)
Preoperative diagnosis: Left ureterolithiasis  Postoperative diagnosis: Same   Procedure: 1. Left ureteroscopic ureterolithotomy with holmium laser lithotripsy                      2. Cystoscopy with stent removal                      3. Left retrograde pyelogram                      4. fluoroscopy   Surgeon: Otelia Limes. Yves Dill MD  Anesthesia: General  Indications:See the history and physical. After informed consent the above procedure(s) were requested     Technique and findings: After adequate general anesthesia been obtained the patient was placed into dorsal lithotomy position and the perineum was prepped and draped in the usual fashion. Fluoroscopy revealed left double pigtail stent in good position. There was a large amount of overlying stool and bowel gas and stone could not be clearly seen. The 21 French cystoscope was coupled camera and then visually advanced into the bladder. No bladder tumors were identified. The double pigtail stent was identified and engaged with the alligator forceps and removed. Fluoroscopically a stone could not be definitively identified due to overlying stool. Therefore the cystoscope was passed back into the bladder and retrograde pyelogram was performed by injecting contrast with a  6 [place] open-ended ureteral catheter into the left ureter. A 5 mm stone was identified in the distal ureter with mild proximal hydronephrosis. The cystoscope was removed and a 4.5 semirigid ureteroscope advanced into the distal ureter. The stone was identified and engaged with a 365  holmium laser fiber. Power was at 0.6 J and frequency of 10 hertz. The stone was powdered. The ureteroscope was removed. 10 cc of viscous Xylocaine was instilled within the urethra. The procedure was then terminated and patient transferred to the recovery room in stable condition.

## 2017-06-15 NOTE — OR Nursing (Signed)
Left stent removed by MD.

## 2017-06-15 NOTE — Anesthesia Procedure Notes (Signed)
Procedure Name: LMA Insertion Date/Time: 06/15/2017 3:47 PM Performed by: Dionne Bucy Pre-anesthesia Checklist: Patient identified, Patient being monitored, Timeout performed, Emergency Drugs available and Suction available Patient Re-evaluated:Patient Re-evaluated prior to induction Oxygen Delivery Method: Circle system utilized Preoxygenation: Pre-oxygenation with 100% oxygen Induction Type: IV induction Ventilation: Mask ventilation without difficulty LMA: LMA inserted LMA Size: 4.0 Tube type: Oral Number of attempts: 1 Placement Confirmation: positive ETCO2 and breath sounds checked- equal and bilateral Tube secured with: Tape Dental Injury: Teeth and Oropharynx as per pre-operative assessment

## 2017-06-16 ENCOUNTER — Encounter: Payer: Self-pay | Admitting: Urology

## 2017-06-18 ENCOUNTER — Encounter: Payer: Self-pay | Admitting: Emergency Medicine

## 2017-06-18 DIAGNOSIS — I10 Essential (primary) hypertension: Secondary | ICD-10-CM | POA: Insufficient documentation

## 2017-06-18 DIAGNOSIS — R338 Other retention of urine: Secondary | ICD-10-CM | POA: Insufficient documentation

## 2017-06-18 DIAGNOSIS — Z7901 Long term (current) use of anticoagulants: Secondary | ICD-10-CM | POA: Insufficient documentation

## 2017-06-18 DIAGNOSIS — R319 Hematuria, unspecified: Secondary | ICD-10-CM | POA: Diagnosis present

## 2017-06-18 DIAGNOSIS — Z79899 Other long term (current) drug therapy: Secondary | ICD-10-CM | POA: Diagnosis not present

## 2017-06-18 DIAGNOSIS — Z87891 Personal history of nicotine dependence: Secondary | ICD-10-CM | POA: Insufficient documentation

## 2017-06-18 LAB — URINALYSIS, COMPLETE (UACMP) WITH MICROSCOPIC
BILIRUBIN URINE: NEGATIVE
Bacteria, UA: NONE SEEN
GLUCOSE, UA: NEGATIVE mg/dL
KETONES UR: NEGATIVE mg/dL
LEUKOCYTES UA: NEGATIVE
NITRITE: NEGATIVE
PH: 6 (ref 5.0–8.0)
Protein, ur: 100 mg/dL — AB
Specific Gravity, Urine: 1.011 (ref 1.005–1.030)
Squamous Epithelial / LPF: NONE SEEN

## 2017-06-18 LAB — BASIC METABOLIC PANEL
ANION GAP: 9 (ref 5–15)
BUN: 21 mg/dL — AB (ref 6–20)
CALCIUM: 8.7 mg/dL — AB (ref 8.9–10.3)
CO2: 28 mmol/L (ref 22–32)
Chloride: 102 mmol/L (ref 101–111)
Creatinine, Ser: 1.43 mg/dL — ABNORMAL HIGH (ref 0.61–1.24)
GFR calc Af Amer: 48 mL/min — ABNORMAL LOW (ref >60)
GFR calc non Af Amer: 42 mL/min — ABNORMAL LOW (ref >60)
GLUCOSE: 102 mg/dL — AB (ref 65–99)
Potassium: 3.9 mmol/L (ref 3.5–5.1)
Sodium: 139 mmol/L (ref 135–145)

## 2017-06-18 LAB — CBC
HCT: 38.2 % — ABNORMAL LOW (ref 40.0–52.0)
HEMOGLOBIN: 12.6 g/dL — AB (ref 13.0–18.0)
MCH: 26.6 pg (ref 26.0–34.0)
MCHC: 33 g/dL (ref 32.0–36.0)
MCV: 80.6 fL (ref 80.0–100.0)
Platelets: 318 10*3/uL (ref 150–440)
RBC: 4.74 MIL/uL (ref 4.40–5.90)
RDW: 15.4 % — AB (ref 11.5–14.5)
WBC: 7.9 10*3/uL (ref 3.8–10.6)

## 2017-06-18 NOTE — ED Triage Notes (Signed)
Pt to ED from home c/o urinary retention and hematuria today.  Family states patient had urinary stent removed on Tuesday and has been voiding well until today.  States around 2130 patient possibly passed a stone and had hematuria.  Patient states pain with urination and bladder pressure.

## 2017-06-19 ENCOUNTER — Emergency Department: Payer: Medicare Other

## 2017-06-19 ENCOUNTER — Emergency Department
Admission: EM | Admit: 2017-06-19 | Discharge: 2017-06-19 | Disposition: A | Discharge status: Home / Self Care | Payer: Medicare Other | Attending: Emergency Medicine | Admitting: Emergency Medicine

## 2017-06-19 DIAGNOSIS — R319 Hematuria, unspecified: Secondary | ICD-10-CM

## 2017-06-19 DIAGNOSIS — R338 Other retention of urine: Secondary | ICD-10-CM

## 2017-06-19 MED ORDER — LIDOCAINE HCL 2 % EX GEL
CUTANEOUS | Status: AC
  Filled 2017-06-19: qty 10

## 2017-06-19 NOTE — ED Provider Notes (Signed)
[facility] Emergency Department Provider Note  ____________________________________________   First MD Initiated Contact with Patient 06/19/17 0129     (approximate)  I have reviewed the triage vital signs and the nursing notes.   HISTORY  Chief Complaint Hematuria    HPI Matthew Hicks. is a 83 y.o. male who comes to the emergency department with multiple complaints. He first says that he has been unable to pass a solid bowel movement since Tuesday roughly 3-1/2 days ago. He is passing flatus. He has also noticed gross hematuria that began today and difficulty initiating urination. Earlier this week he had a ureteral stent removed and initially did well until today. He does take xarelto.  He is currently in no pain. His symptoms have been insidious in onset and slowly progressive. He denies fevers or chills.   Past Medical History:  Diagnosis Date  . A-fib ([place]) 04/08/2014  . Aortic stenosis   . Cardiomyopathy ([place])   . Cataract   . CVA (cerebral infarction) 2003  . Dysrhythmia   . GERD (gastroesophageal reflux disease)   . Hearing loss   . Hyperlipidemia   . Hypertension 05/04/2014  . Lung mass   . Macular degeneration   . Nephrolithiasis   . Pernicious anemia   . Pneumonia 05/2015  . Prostate cancer [facility]) 2007   performed by Dr. Eliberto Ivory  . Skin cancer   . Spinal stenosis   . Stroke [facility])     Patient Active Problem List   Diagnosis Date Noted  . Kidney stone on left side 05/23/2017  . Pure hypercholesterolemia 05/13/2016  . History of stroke 11/14/2015  . Cerebral vascular accident ([place]) 07/02/2014  . BP (high blood pressure) 05/04/2014  . A-fib ([place]) 04/08/2014  . Benign hypertension 04/08/2014  . Aortic heart valve narrowing 04/08/2014  . Cardiomyopathy ([place]) 04/08/2014  . Acid reflux 04/08/2014  . HLD (hyperlipidemia) 04/08/2014  . [place] anemia 04/08/2014  . Spinal stenosis 04/08/2014    Past Surgical History:  Procedure  Laterality Date  . CATARACT EXTRACTION    . CIRCUMCISION  1994  . CYSTOSCOPY  1946  . CYSTOSCOPY W/ URETERAL STENT REMOVAL Left 06/15/2017   Procedure: CYSTOSCOPY WITH STENT REMOVAL, RETROGRADE, AND HOLMIUM LASER;  Surgeon: Royston Cowper, MD;  Location: [facility] ORS;  Service: Urology;  Laterality: Left;  . CYSTOSCOPY WITH STENT PLACEMENT Left 05/23/2017   Procedure: CYSTOSCOPY WITH STENT PLACEMENT;  Surgeon: Heron Sabins, MD;  Location: [facility] ORS;  Service: Urology;  Laterality: Left;  . laparoscopic prostectomy    . Spinal cyst removal    . TRANSURETHRAL RESECTION OF PROSTATE  2007   prostate cancer    Prior to Admission medications   Medication Sig Start Date End Date Taking? Authorizing Provider  acetaminophen (TYLENOL) 500 MG tablet Take 500 mg by mouth daily as needed for mild pain or headache.   Yes [provider]  acetaminophen-codeine (TYLENOL #3) 300-30 MG tablet Take 1-2 tablets by mouth every 4 (four) hours as needed for moderate pain. 06/15/17  Yes Royston Cowper, MD  amiodarone (PACERONE) 100 MG tablet Take 100 mg by mouth daily.   Yes [provider]  atorvastatin (LIPITOR) 20 MG tablet Take 20 mg by mouth daily.   Yes [provider]  Cyanocobalamin (B-12) 5000 MCG CAPS Take 5,000 mcg by mouth daily.    Yes [provider]  furosemide (LASIX) 20 MG tablet Take 20 mg by mouth daily.  09/11/15  Yes [provider]  Multiple Vitamins-Minerals (ICAPS) CAPS Take 1 tablet by mouth daily.   Yes [provider]  ondansetron (ZOFRAN ODT) 8 MG disintegrating tablet Take 0.5 tablets (4 mg total) by mouth every 6 (six) hours as needed for nausea or vomiting. 06/15/17  Yes Royston Cowper, MD  ranitidine (ZANTAC) 300 MG capsule Take 300 mg by mouth daily.  05/13/16 05/23/18 Yes [provider]  rivaroxaban (XARELTO) 10 MG TABS tablet TAKE 1 TABLET(10 MG TOTAL) BY MOUTH DAILY WITH DINNER. 08/26/15  Yes [provider]    azelastine (ASTELIN) 0.1 % nasal spray Place 2 sprays into the nose daily as needed for rhinitis or allergies.  11/14/15 06/07/17  [provider]  ciprofloxacin (CIPRO) 500 MG tablet Take 1 tablet (500 mg total) by mouth 2 (two) times daily. Patient not taking: Reported on 06/07/2017 05/27/17   Nicholes Mango, MD    Allergies Amoxicillin-pot clavulanate; Fesoterodine fumarate er; Oxycodone-acetaminophen; and Sulfa antibiotics  Family History  Problem Relation Age of Onset  . Stroke Father        heart disease  . Pneumonia Father   . Alzheimer's disease Brother   . Colon cancer Daughter 6  . Cancer - Other Paternal Aunt 8       "GYN cancer"  . Cancer - Other Paternal Grandmother 64       "GYN cancer"    Social History Social History  Substance Use Topics  . Smoking status: Former Smoker    Packs/day: 1.00    Years: 40.00    Types: Cigarettes    Quit date: 06/08/1977  . Smokeless tobacco: Never Used     Comment: does not smoke now  . Alcohol use No    Review of Systems Constitutional: No fever/chills Eyes: No visual changes. ENT: No sore throat. Cardiovascular: Denies chest pain. Respiratory: Denies shortness of breath. Gastrointestinal: No abdominal pain.  No nausea, no vomiting.  No diarrhea.  Positive constipation. Genitourinary: Negative for dysuria. Musculoskeletal: Negative for back pain. Skin: Negative for rash. Neurological: Negative for headaches, focal weakness or numbness.   ____________________________________________   PHYSICAL EXAM:  VITAL SIGNS: ED Triage Vitals  Enc Vitals Group     BP 06/18/17 2221 (!) 145/90     Pulse Rate 06/18/17 2221 71     Resp 06/18/17 2221 16     Temp 06/18/17 2221 98.4 F (36.9 C)     Temp Source 06/18/17 2221 Oral     SpO2 06/18/17 2221 99 %     Weight 06/18/17 2223 154 lb (69.9 kg)     Height --      Head Circumference --      Peak Flow --      Pain Score 06/18/17 2223 0     Pain Loc --      Pain Edu?  --      Excl. in [place]? --     Constitutional: alert and oriented 4 very hard of hearing but well-appearing nontoxic no diaphoresis speaks in full clear sentences Eyes: PERRL EOMI. Head: Atraumatic. Nose: No congestion/rhinnorhea. Mouth/Throat: No trismus Neck: No stridor.   Cardiovascular: Normal rate, regular rhythm. Grossly normal heart sounds.  Good peripheral circulation. Respiratory: Normal respiratory effort.  No retractions. Lungs CTAB and moving good air Gastrointestinal: soft nondistended mild lower abdominal tenderness with no rebound no guarding no peritonitis Musculoskeletal: No lower extremity edema   Neurologic:  Normal speech and language. No gross focal neurologic deficits are appreciated. Skin:  Skin is  warm, dry and intact. No rash noted. Psychiatric: Mood and affect are normal. Speech and behavior are normal.    ____________________________________________   DIFFERENTIAL includes but not limited to  gross hematuria, acute kidney injury, urinary obstruction, urinary tract infection ____________________________________________   LABS (all labs ordered are listed, but only abnormal results are displayed)  Labs Reviewed  URINALYSIS, COMPLETE (UACMP) WITH MICROSCOPIC - Abnormal; Notable for the following:       Result Value   Color, Urine RED (*)    APPearance HAZY (*)    Hgb urine dipstick MODERATE (*)    Protein, ur 100 (*)    All other components within normal limits  BASIC METABOLIC PANEL - Abnormal; Notable for the following:    Glucose, Bld 102 (*)    BUN 21 (*)    Creatinine, Ser 1.43 (*)    Calcium 8.7 (*)    GFR calc non Af Amer 42 (*)    GFR calc Af Amer 48 (*)    All other components within normal limits  CBC - Abnormal; Notable for the following:    Hemoglobin 12.6 (*)    HCT 38.2 (*)    RDW 15.4 (*)    All other components within normal limits    gross hematuria with no evidence of infection renal function at  baseline __________________________________________  EKG   ____________________________________________  RADIOLOGY  CT scan with chronic changes but no acute disease noted ____________________________________________   PROCEDURES  Procedure(s) performed: no  Procedures  Critical Care performed: no  Observation: no ____________________________________________   INITIAL IMPRESSION / ASSESSMENT AND PLAN / ED COURSE  Pertinent labs & imaging results that were available during my care of the patient were reviewed by me and considered in my medical decision making (see chart for details).  The patient arrives with multiple issues. He reports constipation and inability to defecate for the past 4 days. He also reports intermittent inability to urinate. On examination she has dysuria with some clot and an elevated postvoid residual. He is on xarelto.  We'll get a CT scan stone as well as irrigate his bladder with a three-way Foley.     nursing staff placed a Foley catheter to irrigate the bladder and the patient's urine came out clear. He may very well have stopped bleeding at this point. I we will keep the Foley catheter in place until he can follow up with his urology appointment in one week for reevaluation. Strict return precautions given. ____________________________________________   FINAL CLINICAL IMPRESSION(S) / ED DIAGNOSES  Final diagnoses:  Hematuria, unspecified type  Acute urinary retention      NEW MEDICATIONS STARTED DURING THIS VISIT:  New Prescriptions   No medications on file     Note:  This document was prepared using Dragon voice recognition software and may include unintentional dictation errors.     Darel Hong, MD 06/19/17 [phone]

## 2017-06-19 NOTE — Discharge Instructions (Signed)
Fortunately today your blood work and your CT scan were reassuring.  Please keep your Foley catheter in until you can follow-up with your urologist in one week or so.  Return to the emergency department sooner for any concerns.  It was a pleasure to take care of you today, and thank you for coming to our emergency department.  If you have any questions or concerns before leaving please ask the nurse to grab me and I'm more than happy to go through your aftercare instructions again.  If you were prescribed any opioid pain medication today such as Norco, Vicodin, Percocet, morphine, hydrocodone, or oxycodone please make sure you do not drive when you are taking this medication as it can alter your ability to drive safely.  If you have any concerns once you are home that you are not improving or are in fact getting worse before you can make it to your follow-up appointment, please do not hesitate to call 911 and come back for further evaluation.  Darel Hong, MD  Results for orders placed or performed during the hospital encounter of 06/19/17  Urinalysis, Complete w Microscopic  Result Value Ref Range   Color, Urine RED (A) YELLOW   APPearance HAZY (A) CLEAR   Specific Gravity, Urine 1.011 1.005 - 1.030   pH 6.0 5.0 - 8.0   Glucose, UA NEGATIVE NEGATIVE mg/dL   Hgb urine dipstick MODERATE (A) NEGATIVE   Bilirubin Urine NEGATIVE NEGATIVE   Ketones, ur NEGATIVE NEGATIVE mg/dL   Protein, ur 100 (A) NEGATIVE mg/dL   Nitrite NEGATIVE NEGATIVE   Leukocytes, UA NEGATIVE NEGATIVE   RBC / HPF TOO NUMEROUS TO COUNT 0 - 5 RBC/hpf   WBC, UA TOO NUMEROUS TO COUNT 0 - 5 WBC/hpf   Bacteria, UA NONE SEEN NONE SEEN   Squamous Epithelial / LPF NONE SEEN NONE SEEN   Mucus PRESENT   Basic metabolic panel  Result Value Ref Range   Sodium 139 135 - 145 mmol/L   Potassium 3.9 3.5 - 5.1 mmol/L   Chloride 102 101 - 111 mmol/L   CO2 28 22 - 32 mmol/L   Glucose, Bld 102 (H) 65 - 99 mg/dL   BUN 21 (H) 6 - 20  mg/dL   Creatinine, Ser 1.43 (H) 0.61 - 1.24 mg/dL   Calcium 8.7 (L) 8.9 - 10.3 mg/dL   GFR calc non Af Amer 42 (L) >60 mL/min   GFR calc Af Amer 48 (L) >60 mL/min   Anion gap 9 5 - 15  CBC  Result Value Ref Range   WBC 7.9 3.8 - 10.6 K/uL   RBC 4.74 4.40 - 5.90 MIL/uL   Hemoglobin 12.6 (L) 13.0 - 18.0 g/dL   HCT 38.2 (L) 40.0 - 52.0 %   MCV 80.6 80.0 - 100.0 fL   MCH 26.6 26.0 - 34.0 pg   MCHC 33.0 32.0 - 36.0 g/dL   RDW 15.4 (H) 11.5 - 14.5 %   Platelets 318 150 - 440 K/uL   Ct Renal Stone Study  Result Date: 06/19/2017 CLINICAL DATA:  Urinary retention and hematuria today. Urinary stent was removed on Tuesday and patient is been voiding well until today. Possibly passed a stone at 2130 hours today. Pain with urination and bladder pressure. History of prostate cancer postoperative. EXAM: CT ABDOMEN AND PELVIS WITHOUT CONTRAST TECHNIQUE: Multidetector CT imaging of the abdomen and pelvis was performed following the standard protocol without IV contrast. COMPARISON:  06/11/2017 FINDINGS: Lower chest: Emphysematous changes in  the lung bases. Small right pleural effusion with consolidation and volume loss in the right lower lung. This is suspicious for pneumonia. Cardiac enlargement. Coronary artery calcifications. Aortic calcifications. Hepatobiliary: No focal liver abnormality is seen. No gallstones, gallbladder wall thickening, or biliary dilatation. Pancreas: Unremarkable. No pancreatic ductal dilatation or surrounding inflammatory changes. Spleen: Normal in size without focal abnormality. Adrenals/Urinary Tract: Right adrenal gland nodule measuring 2.9 cm diameter. Non fatty Hounsfield unit measurement of 38 Hounsfield units. Primary or secondary malignancy not excluded. No change since prior study. No hydronephrosis or hydroureter. 4 mm stone in the midportion left kidney. Subcentimeter cyst in the right kidney. No ureteral stones are demonstrated. Bladder wall is not thickened and no bladder  filling defects are appreciated. Stomach/Bowel: Stomach is within normal limits. Appendix is not identified. No evidence of bowel wall thickening, distention, or inflammatory changes. Vascular/Lymphatic: Extensive vascular calcifications involving the abdominal aorta and branch vessels. Tortuous aorta. 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 No significant lymphadenopathy. Reproductive: Metallic seed implants in the prostate gland. No prostate enlargement. Other: No free air or free fluid in the abdomen. Abdominal wall musculature appears intact. Musculoskeletal: Degenerative changes throughout the lumbar spine. Diffuse bone demineralization. Spondylolysis with mild spondylolisthesis at L5-S1. No destructive or sclerotic bone lesions. Lumbar scoliosis convex towards the left. IMPRESSION: 1. Consolidation in the right lung base with small right pleural effusion may indicate pneumonia. 2. Nonobstructing left intrarenal stone. 3. No evidence of renal or ureteral obstruction. 4. Aortic atherosclerosis. 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 5. 2.9 cm diameter right adrenal gland nodule is indeterminate. Primary metastatic disease not excluded. No change since prior study. Electronically Signed   By: Lucienne Capers M.D.   On: 06/19/2017 02:15   Ct Adrenal Abd Wo  Result Date: 06/11/2017 CLINICAL DATA:  Right adrenal mass. Left ureteral calculus and hydronephrosis. EXAM: CT ABDOMEN WITHOUT CONTRAST TECHNIQUE: Multidetector CT imaging of the abdomen was performed following the standard protocol without IV contrast. COMPARISON:  CTA on 05/23/2017, PET-CT on 06/25/2015 and MRI on 11/14/2008 FINDINGS: Lower chest: Stable cardiomegaly. Stable right basilar pleural- parenchymal scarring and rounded atelectasis. Mild diffuse wall thickening of distal esophagus is seen, suspicious for esophagitis. Hepatobiliary: No masses visualized on this unenhanced exam. Gallbladder is unremarkable.  Pancreas: No mass or inflammatory process visualized on this unenhanced exam. Spleen:  Within normal limits in size. Adrenals/Urinary tract: A right adrenal mass measures 2.8 x 2.8 cm. This has increased in size from 2.4 x 2.0 cm in 2016, and is new since 2010. Density measures 40 Hounsfield units, which is nonspecific. A 1.8 cm left adrenal mass shows no significant change since 2010 MRI, consistent with benign adrenal adenoma . Left ureteral stent is now seen in place, with resolution of left hydronephrosis since most recent exam. Tiny less than 5 mm calculus is noted in lower pole of left kidney. Stomach/Bowel: Unremarkable. Vascular/Lymphatic: No pathologically enlarged lymph nodes identified. No evidence of abdominal aortic aneurysm. Aortic atherosclerosis. Other:  None. Musculoskeletal: No suspicious bone lesions identified. Thoracolumbar spondylosis and rotatory levoscoliosis. IMPRESSION: Further mild increase in size of right adrenal mass, which has nonspecific characteristics. Consider correlation with biochemical assays, and further evaluation with adrenal protocol abdomen CT without and with contrast, PET-CT scan, or biopsy/resection. Stable benign left adrenal adenoma. Interval placement of left ureteral stent, with interval resolution of left hydronephrosis. Mild diffuse wall thickening distal thoracic esophagus, suspicious for esophagitis. Consider endoscopy for further evaluation. Other incidental findings  described above. Electronically Signed   By: Earle Gell M.D.   On: 06/11/2017 10:39   Ct Angio Abd/pel W And/or Wo Contrast  Result Date: 05/23/2017 CLINICAL DATA:  83 year old male with history of left-sided flank pain, hematuria and vomiting today. History of prostate cancer. EXAM: CTA ABDOMEN AND PELVIS WITHOUT AND WITH CONTRAST TECHNIQUE: Multidetector CT imaging of the abdomen and pelvis was performed using the standard protocol during bolus administration of intravenous contrast.  Multiplanar reconstructed images and MIPs were obtained and reviewed to evaluate the vascular anatomy. CONTRAST:  75 mL of Isovue 370. COMPARISON:  PET-CT 06/25/2015. CT the abdomen and pelvis 10/05/2007. FINDINGS: VASCULAR Aorta: Extensive atherosclerosis of the abdominal aorta with fusiform ectasia of the infrarenal abdominal aorta which measures up to 2.8 cm in diameter. Celiac: Patent without evidence of aneurysm, dissection, vasculitis or significant stenosis. SMA: Atherosclerotic plaque at the origin resulting in mild stenosis. No evidence of dissection or vasculitis. Renals: Single renal arteries bilaterally. Extensive atherosclerosis with mild ostial stenosis on the right and severe ostial stenosis on the left. IMA: Patent without evidence of aneurysm, dissection, vasculitis or significant stenosis. Inflow: Extensive atherosclerosis with aneurysmal dilatation of the right common iliac artery which measures up to 2.3 cm in diameter related to a large penetrating ulcer. Proximal Outflow: Bilateral common femoral and visualized portions of the superficial and profunda femoral arteries are patent without evidence of aneurysm, dissection, vasculitis or significant stenosis. Veins: No obvious venous abnormality within the limitations of this arterial phase study. Review of the MIP images confirms the above findings. NON-VASCULAR Lower chest: Cardiomegaly with severe right atrial dilatation. Calcifications of the mitral annulus. Atherosclerotic calcifications in the right coronary artery. Small chronic right pleural effusion with chronic rounded atelectasis in the right lower lobe and posterior aspect of the lateral segment of theright middle lobe. Hepatobiliary: No suspicious cystic or solid hepatic lesions. No intra or extrahepatic biliary ductal dilatation. Gallbladder is normal in appearance. Pancreas: No pancreatic mass. No pancreatic ductal dilatation. No pancreatic or peripancreatic fluid or inflammatory  changes. Spleen: Unremarkable. Adrenals/Urinary Tract: 7 mm calculus at junction of proximal and middle third of the left ureter (axial image 96 of series 4) with mild proximal left hydroureteronephrosis and extensive left perinephric stranding. Delayed nephrogram on the left side related to obstruction. 1.6 cm simple cyst in the anterior aspect of the interpolar region of the right kidney. No suspicious renal lesions are noted. No right-sided hydroureteronephrosis. Urinary bladder is normal in appearance. 2.4 x 2.1 cm left adrenal nodule is similar to multiple prior examinations, most compatible with an adenoma. However, the previously noted right adrenal nodule has progressively enlarged over serial prior examinations in currently measures 3.5 x 2.8 cm, concerning for possible metastatic lesion. Stomach/Bowel: The appearance of the stomach is normal. There is no pathologic dilatation of small bowel or colon. Numerous colonic diverticulae are noted, without surrounding inflammatory changes to suggest an acute diverticulitis at this time. Normal appendix. Lymphatic: No lymphadenopathy noted in the abdomen or pelvis. Reproductive: Brachytherapy implants throughout the prostate gland. Asymmetric enlargement of the right seminal vesicle, stable to prior studies dating back to 2008, presumably benign. Other: No significant volume of ascites.  No pneumoperitoneum. Musculoskeletal: There are no aggressive appearing lytic or blastic lesions noted in the visualized portions of the skeleton. IMPRESSION: VASCULAR 1. 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  No acute findings are noted. 2. Left anterior descending and right coronary artery disease. NON-VASCULAR 1. 7 mm calculus at the junction of proximal and middle third of the left ureter with mild  left-sided hydroureteronephrosis and extensive left-sided perinephric stranding indicative of obstruction. This is likely the source of the patient's symptoms. 2. Colonic diverticulosis without evidence of acute diverticulitis at this time. 3. Slow progressive enlargement of a right adrenal mass which is incompletely characterized but currently measures 3.5 x 2.8 cm, concerning for a metastatic lesion or primary adrenal lesion. This could be further evaluated with followup nonemergent adrenal protocol CT scan. 4. Left adrenal nodule is stable compared to numerous prior examinations, previously characterized as an adenoma. 5. Cardiomegaly with severe right atrial dilatation. 6. Small chronic right pleural effusion with areas of rounded atelectasis in the right lung base, similar to prior studies. 7. Additional incidental findings, as above. Aortic Atherosclerosis (ICD10-I70.0). Electronically Signed   By: Vinnie Langton M.D.   On: 05/23/2017 18:57

## 2017-06-19 NOTE — ED Notes (Signed)
BLADDER SCAN 454ML. MD MADE AWARE.

## 2017-06-19 NOTE — ED Notes (Signed)
Education provided to caregivers on foley leg bag and drainage system, as well as how to keep catheter clean.

## 2017-06-19 NOTE — ED Notes (Signed)
Bladder Irrigation initiated with 2078ml of NS after foley insertion.

## 2017-06-30 ENCOUNTER — Encounter (INDEPENDENT_AMBULATORY_CARE_PROVIDER_SITE_OTHER): Payer: Medicare Other | Admitting: Ophthalmology

## 2017-06-30 DIAGNOSIS — H43813 Vitreous degeneration, bilateral: Secondary | ICD-10-CM

## 2017-06-30 DIAGNOSIS — H353231 Exudative age-related macular degeneration, bilateral, with active choroidal neovascularization: Secondary | ICD-10-CM | POA: Diagnosis not present

## 2017-07-02 ENCOUNTER — Telehealth: Payer: Self-pay

## 2017-07-02 DIAGNOSIS — R918 Other nonspecific abnormal finding of lung field: Secondary | ICD-10-CM

## 2017-07-02 NOTE — Telephone Encounter (Signed)
Called patient and spoke with wife to let her know that he needed a repeat CT Scan and then follow up with Dr. Genevive Bi. Told patient's wife that I would mail her the information by mail. She agreed. She asked for specifics dates for me to schedule his CT Scan and Follow up with Dr. Genevive Bi.

## 2017-07-08 ENCOUNTER — Ambulatory Visit: Payer: Medicare Other

## 2017-07-09 ENCOUNTER — Ambulatory Visit: Payer: Self-pay | Admitting: Cardiothoracic Surgery

## 2017-07-15 DIAGNOSIS — E279 Disorder of adrenal gland, unspecified: Secondary | ICD-10-CM

## 2017-07-15 DIAGNOSIS — E278 Other specified disorders of adrenal gland: Secondary | ICD-10-CM | POA: Insufficient documentation

## 2017-08-09 ENCOUNTER — Ambulatory Visit
Admission: RE | Admit: 2017-08-09 | Discharge: 2017-08-09 | Disposition: A | Discharge status: Home / Self Care | Payer: Medicare Other | Source: Ambulatory Visit | Attending: Cardiothoracic Surgery | Admitting: Cardiothoracic Surgery

## 2017-08-09 DIAGNOSIS — D3502 Benign neoplasm of left adrenal gland: Secondary | ICD-10-CM | POA: Diagnosis not present

## 2017-08-09 DIAGNOSIS — J432 Centrilobular emphysema: Secondary | ICD-10-CM | POA: Insufficient documentation

## 2017-08-09 DIAGNOSIS — R918 Other nonspecific abnormal finding of lung field: Secondary | ICD-10-CM | POA: Diagnosis not present

## 2017-08-09 DIAGNOSIS — I700 Atherosclerosis of aorta: Secondary | ICD-10-CM | POA: Insufficient documentation

## 2017-08-09 DIAGNOSIS — D3501 Benign neoplasm of right adrenal gland: Secondary | ICD-10-CM | POA: Insufficient documentation

## 2017-08-09 DIAGNOSIS — J90 Pleural effusion, not elsewhere classified: Secondary | ICD-10-CM | POA: Insufficient documentation

## 2017-08-13 ENCOUNTER — Ambulatory Visit (INDEPENDENT_AMBULATORY_CARE_PROVIDER_SITE_OTHER): Payer: Medicare Other | Admitting: Cardiothoracic Surgery

## 2017-08-13 ENCOUNTER — Encounter: Payer: Self-pay | Admitting: Cardiothoracic Surgery

## 2017-08-13 VITALS — BP 139/77 | HR 64 | Temp 98.0°F | Wt 149.0 lb

## 2017-08-13 DIAGNOSIS — R918 Other nonspecific abnormal finding of lung field: Secondary | ICD-10-CM | POA: Diagnosis not present

## 2017-08-13 NOTE — Patient Instructions (Signed)
We will contact you in a year to make sure that you have another CT Scan and follow up with Dr. Genevive Bi.

## 2017-08-13 NOTE — Progress Notes (Signed)
  Patient ID: Matthew Form., male   DOB: [birth year], 83 y.o.   MRN: [id number]  HISTORY: He returns today in follow-up.  In the last several months he has had issues with urinary tract infections as well as kidney stones.  He is recovered from those without any significant problems however.  He states that he does get chronically short of breath with minimal activities.  He has had no fevers or chills.   Vitals:   08/13/17 0855  BP: 139/77  Pulse: 64  Temp: 98 F (36.7 C)     EXAM:    Resp: Lungs show markedly diminished breath sounds throughout the right side..  No respiratory distress, normal effort. Heart:  Regular without murmurs Abd:  Abdomen is soft, non distended and non tender. No masses are palpable.  There is no rebound and no guarding.  Neurological: Alert and oriented to person, place, and time. Coordination normal.  Skin: Skin is warm and dry. No rash noted. No diaphoretic. No erythema. No pallor.  Psychiatric: Normal mood and affect. Normal behavior. Judgment and thought content normal.    ASSESSMENT: I have independently reviewed the patient's CT scan.  There are no changes in the complex right lung and pleural effusion.   PLAN:   I explained to the patient and his son that I would recommend one more CT scan in a year.  Because of his advanced age and relatively declining functional status I told the son that if it was not clinically appropriate to bring him in that that would be fine.  He understands.  I will see the patient back in 1 year with a CT scan of the chest.    Matthew Lewandowsky, MD

## 2017-08-26 ENCOUNTER — Other Ambulatory Visit: Payer: Self-pay | Admitting: Internal Medicine

## 2017-08-26 DIAGNOSIS — E279 Disorder of adrenal gland, unspecified: Principal | ICD-10-CM

## 2017-08-26 DIAGNOSIS — E278 Other specified disorders of adrenal gland: Secondary | ICD-10-CM

## 2017-08-30 ENCOUNTER — Other Ambulatory Visit: Payer: Self-pay | Admitting: Internal Medicine

## 2017-08-30 DIAGNOSIS — D3500 Benign neoplasm of unspecified adrenal gland: Secondary | ICD-10-CM

## 2017-08-31 ENCOUNTER — Other Ambulatory Visit: Payer: Self-pay | Admitting: Internal Medicine

## 2017-08-31 DIAGNOSIS — D3501 Benign neoplasm of right adrenal gland: Secondary | ICD-10-CM

## 2017-09-08 ENCOUNTER — Encounter (INDEPENDENT_AMBULATORY_CARE_PROVIDER_SITE_OTHER): Payer: Medicare Other | Admitting: Ophthalmology

## 2017-09-08 DIAGNOSIS — H353231 Exudative age-related macular degeneration, bilateral, with active choroidal neovascularization: Secondary | ICD-10-CM | POA: Diagnosis not present

## 2017-09-08 DIAGNOSIS — H43813 Vitreous degeneration, bilateral: Secondary | ICD-10-CM

## 2017-10-19 ENCOUNTER — Ambulatory Visit
Admission: RE | Admit: 2017-10-19 | Discharge: 2017-10-19 | Disposition: A | Discharge status: Home / Self Care | Payer: Medicare Other | Source: Ambulatory Visit | Attending: Internal Medicine | Admitting: Internal Medicine

## 2017-10-19 DIAGNOSIS — N200 Calculus of kidney: Secondary | ICD-10-CM | POA: Insufficient documentation

## 2017-10-19 DIAGNOSIS — I700 Atherosclerosis of aorta: Secondary | ICD-10-CM | POA: Insufficient documentation

## 2017-10-19 DIAGNOSIS — J90 Pleural effusion, not elsewhere classified: Secondary | ICD-10-CM | POA: Diagnosis not present

## 2017-10-19 DIAGNOSIS — I2510 Atherosclerotic heart disease of native coronary artery without angina pectoris: Secondary | ICD-10-CM | POA: Diagnosis not present

## 2017-10-19 DIAGNOSIS — K449 Diaphragmatic hernia without obstruction or gangrene: Secondary | ICD-10-CM | POA: Diagnosis not present

## 2017-10-19 DIAGNOSIS — D3501 Benign neoplasm of right adrenal gland: Secondary | ICD-10-CM | POA: Diagnosis present

## 2017-11-24 ENCOUNTER — Encounter (INDEPENDENT_AMBULATORY_CARE_PROVIDER_SITE_OTHER): Payer: Medicare Other | Admitting: Ophthalmology

## 2017-11-24 DIAGNOSIS — H353231 Exudative age-related macular degeneration, bilateral, with active choroidal neovascularization: Secondary | ICD-10-CM | POA: Diagnosis not present

## 2017-11-24 DIAGNOSIS — H43813 Vitreous degeneration, bilateral: Secondary | ICD-10-CM | POA: Diagnosis not present

## 2018-02-16 ENCOUNTER — Encounter (INDEPENDENT_AMBULATORY_CARE_PROVIDER_SITE_OTHER): Payer: Medicare Other | Admitting: Ophthalmology

## 2018-02-16 DIAGNOSIS — H353231 Exudative age-related macular degeneration, bilateral, with active choroidal neovascularization: Secondary | ICD-10-CM

## 2018-02-16 DIAGNOSIS — H43813 Vitreous degeneration, bilateral: Secondary | ICD-10-CM | POA: Diagnosis not present

## 2018-04-06 IMAGING — CT CT ABDOMEN W/O CM
1 of 2 series · 14 of 32 positions shown, 19 images · non-contrast
Comparison: CTA on 05/23/2017, PET-CT on 06/25/2015 and MRI on
11/14/2008

CLINICAL DATA: Right adrenal mass. Left ureteral calculus and
hydronephrosis.

EXAM:
CT ABDOMEN WITHOUT CONTRAST
TECHNIQUE: Multidetector CT imaging of the abdomen was performed following the
standard protocol without IV contrast.

[Series 2: axial st · axial · 0.74mm/px · z∈[-753,-564]mm · 14 of 71 slices shown, 19 images]
[im 4/71  soft-tissue]
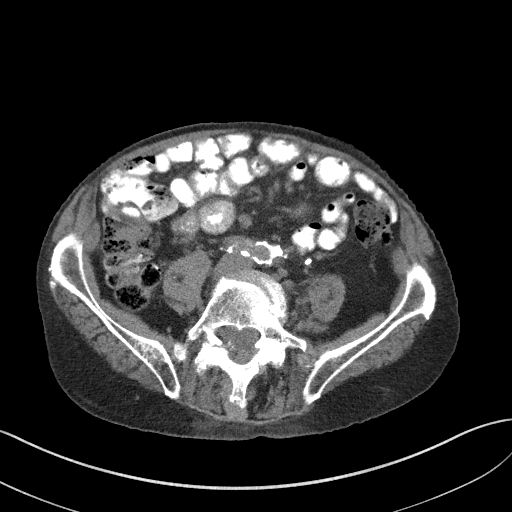
[im 4/71  bone]
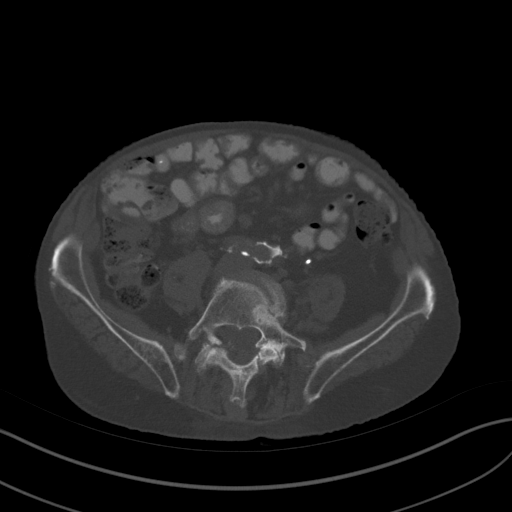
[im 11/71  soft-tissue]
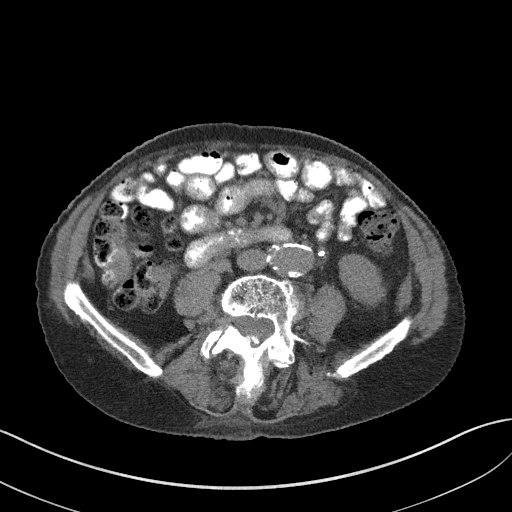
[im 15/71  soft-tissue]
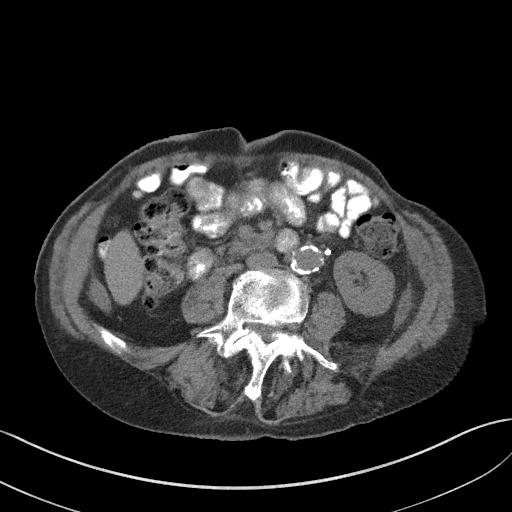
[im 22/71  soft-tissue]
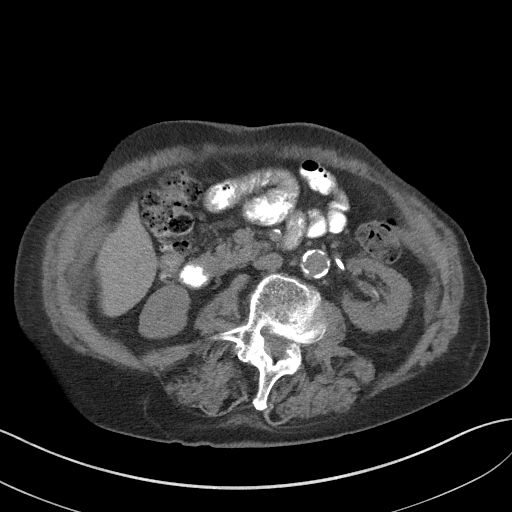
[im 25/71  soft-tissue]
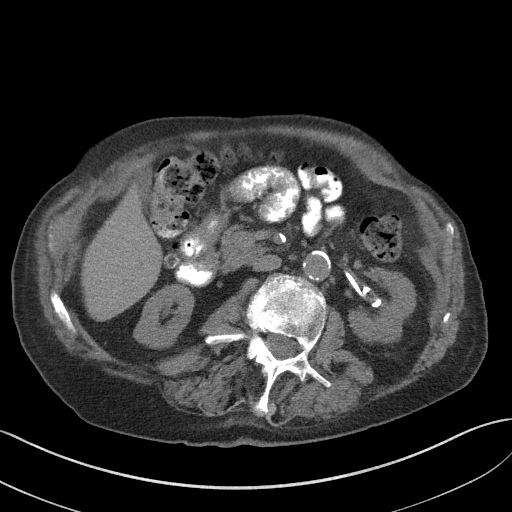
[im 32/71  soft-tissue]
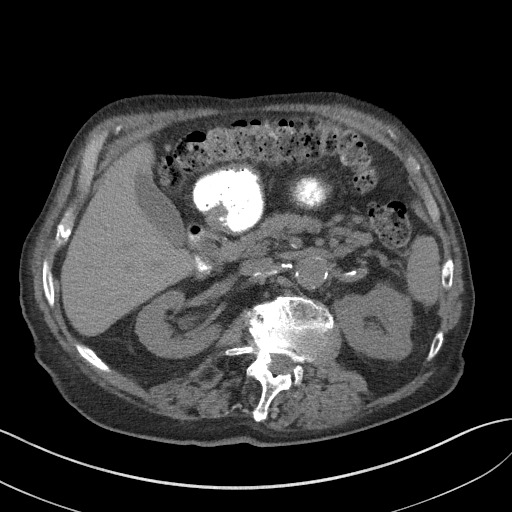
[im 36/71  soft-tissue]
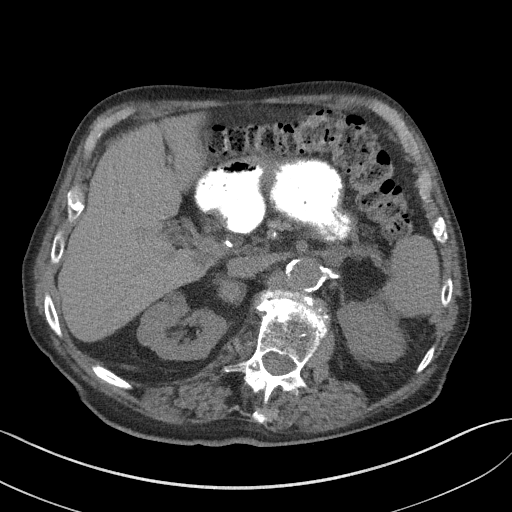
[im 39/71  soft-tissue]
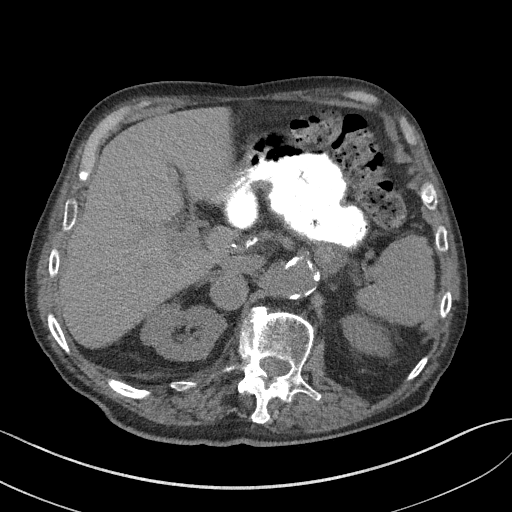
[im 46/71  soft-tissue]
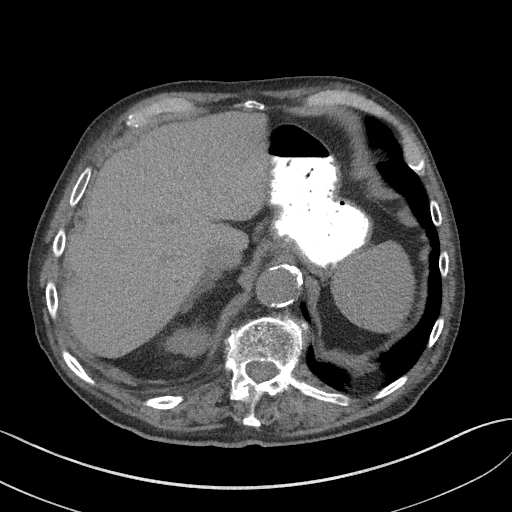
[im 46/71  bone]
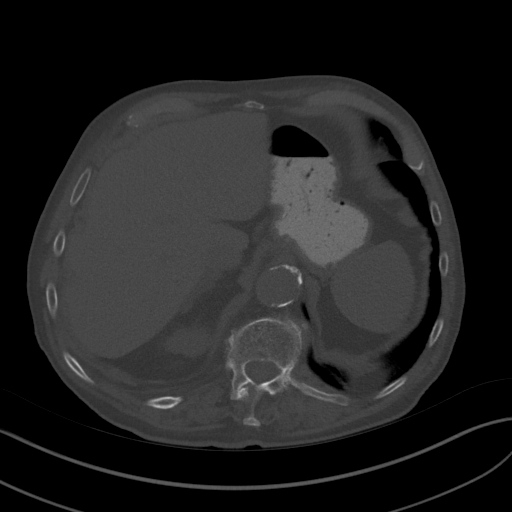
[im 50/71  soft-tissue]
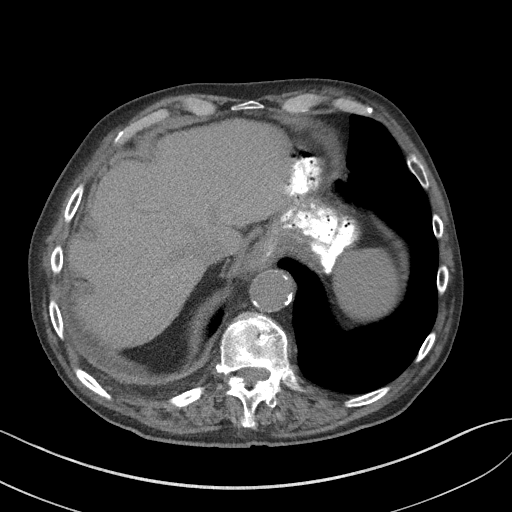
[im 57/71  soft-tissue]
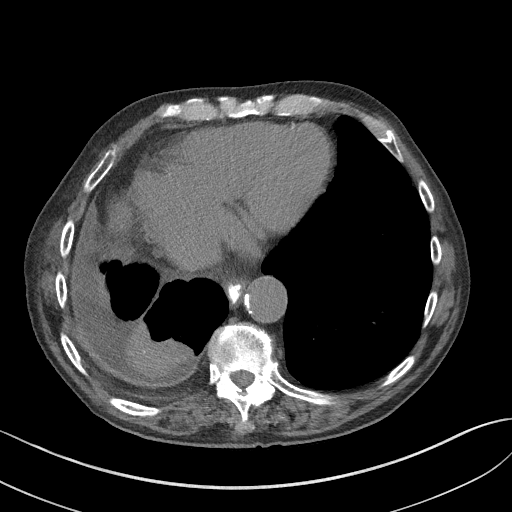
[im 57/71  lung]
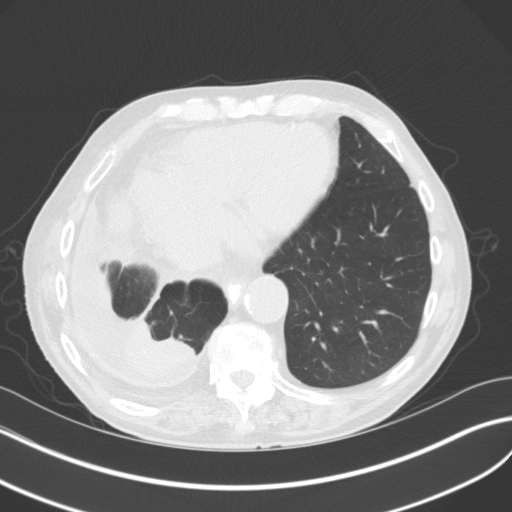
[im 60/71  soft-tissue]
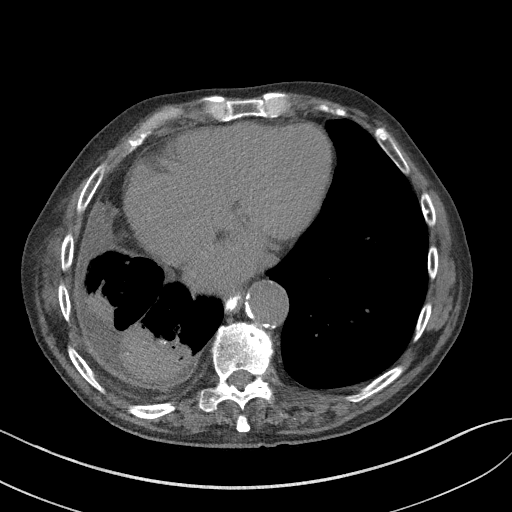
[im 60/71  lung]
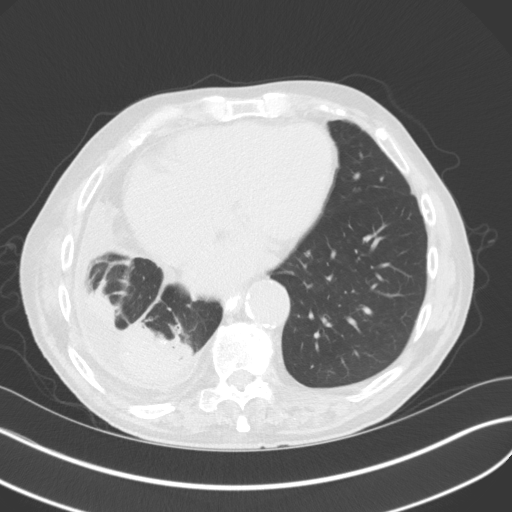
[im 64/71  lung]
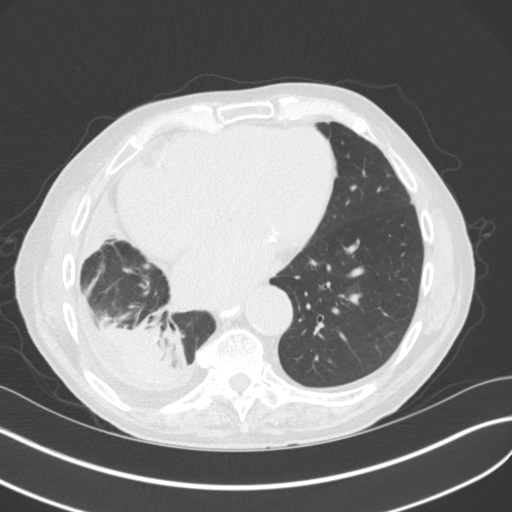
[im 67/71  soft-tissue]
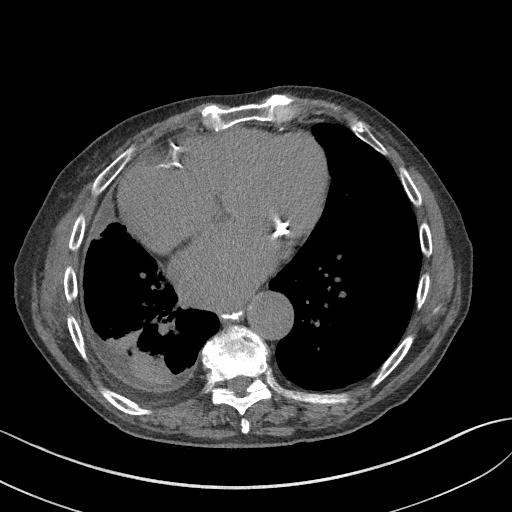
[im 67/71  lung]
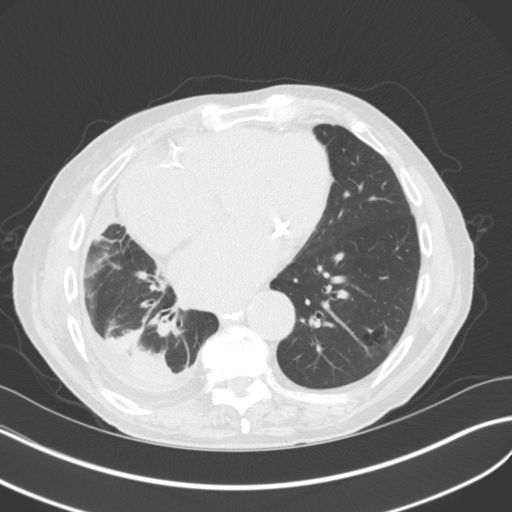

[14 of 32 positions shown; findings below may reference images not displayed]

FINDINGS: Lower chest: Stable cardiomegaly. Stable right basilar pleural-
parenchymal scarring and rounded atelectasis. Mild diffuse wall
thickening of distal esophagus is seen, suspicious for esophagitis.

Hepatobiliary: No masses visualized on this unenhanced exam.
Gallbladder is unremarkable.

Pancreas: No mass or inflammatory process visualized on this
unenhanced exam.

Spleen:  Within normal limits in size.

Adrenals/Urinary tract: A right adrenal mass measures 2.8 x 2.8 cm.
This has increased in size from 2.4 x 2.0 cm in 5920, and is new
since 5515. Density measures 40 Hounsfield units, which is
nonspecific. A 1.8 cm left adrenal mass shows no significant change
since 5515 MRI, consistent with benign adrenal adenoma .

Left ureteral stent is now seen in place, with resolution of left
hydronephrosis since most recent exam. Tiny less than 5 mm calculus
is noted in lower pole of left kidney.

Stomach/Bowel: Unremarkable.

Vascular/Lymphatic: No pathologically enlarged lymph nodes
identified. No evidence of abdominal aortic aneurysm. Aortic
atherosclerosis.

Other:  None.

Musculoskeletal: No suspicious bone lesions identified.
Thoracolumbar spondylosis and rotatory levoscoliosis.
IMPRESSION: Further mild increase in size of right adrenal mass, which has
nonspecific characteristics. Consider correlation with biochemical
assays, and further evaluation with adrenal protocol abdomen CT
without and with contrast, PET-CT scan, or biopsy/resection.

Stable benign left adrenal adenoma.

Interval placement of left ureteral stent, with interval resolution
of left hydronephrosis.

Mild diffuse wall thickening distal thoracic esophagus, suspicious
for esophagitis. Consider endoscopy for further evaluation.

Other incidental findings described above.

## 2018-04-22 ENCOUNTER — Other Ambulatory Visit: Payer: Self-pay | Admitting: Internal Medicine

## 2018-04-22 DIAGNOSIS — E278 Other specified disorders of adrenal gland: Secondary | ICD-10-CM

## 2018-04-22 DIAGNOSIS — R935 Abnormal findings on diagnostic imaging of other abdominal regions, including retroperitoneum: Secondary | ICD-10-CM

## 2018-04-22 DIAGNOSIS — E279 Disorder of adrenal gland, unspecified: Secondary | ICD-10-CM

## 2018-04-25 ENCOUNTER — Other Ambulatory Visit: Payer: Self-pay | Admitting: Internal Medicine

## 2018-04-25 DIAGNOSIS — E278 Other specified disorders of adrenal gland: Secondary | ICD-10-CM

## 2018-04-25 DIAGNOSIS — R935 Abnormal findings on diagnostic imaging of other abdominal regions, including retroperitoneum: Secondary | ICD-10-CM

## 2018-04-25 DIAGNOSIS — E279 Disorder of adrenal gland, unspecified: Secondary | ICD-10-CM

## 2018-05-02 ENCOUNTER — Ambulatory Visit
Admission: RE | Admit: 2018-05-02 | Discharge: 2018-05-02 | Disposition: A | Discharge status: Home / Self Care | Payer: Medicare Other | Source: Ambulatory Visit | Attending: Internal Medicine | Admitting: Internal Medicine

## 2018-05-02 DIAGNOSIS — E279 Disorder of adrenal gland, unspecified: Secondary | ICD-10-CM | POA: Diagnosis not present

## 2018-05-02 DIAGNOSIS — E278 Other specified disorders of adrenal gland: Secondary | ICD-10-CM

## 2018-05-02 DIAGNOSIS — R935 Abnormal findings on diagnostic imaging of other abdominal regions, including retroperitoneum: Secondary | ICD-10-CM

## 2018-06-01 ENCOUNTER — Encounter (INDEPENDENT_AMBULATORY_CARE_PROVIDER_SITE_OTHER): Payer: Medicare Other | Admitting: Ophthalmology

## 2018-06-01 DIAGNOSIS — H353231 Exudative age-related macular degeneration, bilateral, with active choroidal neovascularization: Secondary | ICD-10-CM | POA: Diagnosis not present

## 2018-06-01 DIAGNOSIS — H43813 Vitreous degeneration, bilateral: Secondary | ICD-10-CM | POA: Diagnosis not present

## 2018-06-20 ENCOUNTER — Encounter (INDEPENDENT_AMBULATORY_CARE_PROVIDER_SITE_OTHER): Payer: Medicare Other | Admitting: Ophthalmology

## 2018-06-20 DIAGNOSIS — H43813 Vitreous degeneration, bilateral: Secondary | ICD-10-CM | POA: Diagnosis not present

## 2018-06-20 DIAGNOSIS — H353231 Exudative age-related macular degeneration, bilateral, with active choroidal neovascularization: Secondary | ICD-10-CM

## 2018-07-15 ENCOUNTER — Other Ambulatory Visit: Payer: Self-pay

## 2018-07-15 ENCOUNTER — Telehealth: Payer: Self-pay

## 2018-07-15 DIAGNOSIS — R911 Solitary pulmonary nodule: Secondary | ICD-10-CM

## 2018-07-15 NOTE — Telephone Encounter (Signed)
Left message with patient's son to call office regarding one year follow up CT scan and Dr.Oaks.   CT scan and follow up appointment will need to be made if son agrees to bring his dad.

## 2018-07-15 NOTE — Telephone Encounter (Signed)
CT scan 08/16/18 at 10:00 o'clock [facility].  Dr.Oaks 08/22/18 @ 10:45 am.  Left message on son's VM of the appointments listed above also mailed appointment reminders.

## 2018-07-15 NOTE — Telephone Encounter (Signed)
Patients son will bring father to appointments, he stated right now any day and time would be okay for them

## 2018-08-10 ENCOUNTER — Emergency Department
Admission: EM | Admit: 2018-08-10 | Discharge: 2018-08-11 | Disposition: A | Discharge status: Home / Self Care | Payer: Medicare Other | Attending: Emergency Medicine | Admitting: Emergency Medicine

## 2018-08-10 ENCOUNTER — Encounter
Admission: EM | Disposition: A | Discharge status: Home / Self Care | Payer: Self-pay | Source: Home / Self Care | Attending: Emergency Medicine

## 2018-08-10 ENCOUNTER — Other Ambulatory Visit: Payer: Self-pay

## 2018-08-10 ENCOUNTER — Encounter: Payer: Self-pay | Admitting: Emergency Medicine

## 2018-08-10 ENCOUNTER — Emergency Department: Payer: Medicare Other | Admitting: Certified Registered"

## 2018-08-10 DIAGNOSIS — Z85828 Personal history of other malignant neoplasm of skin: Secondary | ICD-10-CM | POA: Diagnosis not present

## 2018-08-10 DIAGNOSIS — I350 Nonrheumatic aortic (valve) stenosis: Secondary | ICD-10-CM | POA: Diagnosis not present

## 2018-08-10 DIAGNOSIS — Z8546 Personal history of malignant neoplasm of prostate: Secondary | ICD-10-CM | POA: Insufficient documentation

## 2018-08-10 DIAGNOSIS — Z7901 Long term (current) use of anticoagulants: Secondary | ICD-10-CM | POA: Diagnosis not present

## 2018-08-10 DIAGNOSIS — N35919 Unspecified urethral stricture, male, unspecified site: Secondary | ICD-10-CM | POA: Insufficient documentation

## 2018-08-10 DIAGNOSIS — N320 Bladder-neck obstruction: Secondary | ICD-10-CM | POA: Insufficient documentation

## 2018-08-10 DIAGNOSIS — D510 Vitamin B12 deficiency anemia due to intrinsic factor deficiency: Secondary | ICD-10-CM | POA: Insufficient documentation

## 2018-08-10 DIAGNOSIS — I4891 Unspecified atrial fibrillation: Secondary | ICD-10-CM | POA: Diagnosis not present

## 2018-08-10 DIAGNOSIS — Z87891 Personal history of nicotine dependence: Secondary | ICD-10-CM | POA: Diagnosis not present

## 2018-08-10 DIAGNOSIS — Z79899 Other long term (current) drug therapy: Secondary | ICD-10-CM | POA: Insufficient documentation

## 2018-08-10 DIAGNOSIS — Z882 Allergy status to sulfonamides status: Secondary | ICD-10-CM | POA: Insufficient documentation

## 2018-08-10 DIAGNOSIS — I429 Cardiomyopathy, unspecified: Secondary | ICD-10-CM | POA: Insufficient documentation

## 2018-08-10 DIAGNOSIS — Z880 Allergy status to penicillin: Secondary | ICD-10-CM | POA: Insufficient documentation

## 2018-08-10 DIAGNOSIS — R339 Retention of urine, unspecified: Secondary | ICD-10-CM

## 2018-08-10 DIAGNOSIS — Z87442 Personal history of urinary calculi: Secondary | ICD-10-CM | POA: Diagnosis not present

## 2018-08-10 DIAGNOSIS — I10 Essential (primary) hypertension: Secondary | ICD-10-CM | POA: Diagnosis not present

## 2018-08-10 DIAGNOSIS — E785 Hyperlipidemia, unspecified: Secondary | ICD-10-CM | POA: Diagnosis not present

## 2018-08-10 DIAGNOSIS — Z8249 Family history of ischemic heart disease and other diseases of the circulatory system: Secondary | ICD-10-CM | POA: Diagnosis not present

## 2018-08-10 DIAGNOSIS — Z8673 Personal history of transient ischemic attack (TIA), and cerebral infarction without residual deficits: Secondary | ICD-10-CM | POA: Diagnosis not present

## 2018-08-10 DIAGNOSIS — R338 Other retention of urine: Secondary | ICD-10-CM | POA: Insufficient documentation

## 2018-08-10 HISTORY — PX: CYSTOSCOPY: SHX5120

## 2018-08-10 LAB — CBC
HCT: 36.4 % — ABNORMAL LOW (ref 39.0–52.0)
HEMOGLOBIN: 11.6 g/dL — AB (ref 13.0–17.0)
MCH: 26.1 pg (ref 26.0–34.0)
MCHC: 31.9 g/dL (ref 30.0–36.0)
MCV: 82 fL (ref 80.0–100.0)
Platelets: 321 10*3/uL (ref 150–400)
RBC: 4.44 MIL/uL (ref 4.22–5.81)
RDW: 14.7 % (ref 11.5–15.5)
WBC: 13.8 10*3/uL — ABNORMAL HIGH (ref 4.0–10.5)
nRBC: 0 % (ref 0.0–0.2)

## 2018-08-10 LAB — BASIC METABOLIC PANEL
Anion gap: 13 (ref 5–15)
BUN: 15 mg/dL (ref 8–23)
CHLORIDE: 100 mmol/L (ref 98–111)
CO2: 23 mmol/L (ref 22–32)
Calcium: 7.9 mg/dL — ABNORMAL LOW (ref 8.9–10.3)
Creatinine, Ser: 1.11 mg/dL (ref 0.61–1.24)
GFR calc Af Amer: >60 mL/min (ref >60)
GFR calc non Af Amer: 56 mL/min — ABNORMAL LOW (ref >60)
GLUCOSE: 105 mg/dL — AB (ref 70–99)
POTASSIUM: 3 mmol/L — AB (ref 3.5–5.1)
Sodium: 136 mmol/L (ref 135–145)

## 2018-08-10 SURGERY — CYSTOSCOPY
Anesthesia: General

## 2018-08-10 MED ORDER — FENTANYL 50 MCG/ML (10ML) SYRINGE FOR MEDFUSION PUMP - OPTIME
INTRAMUSCULAR | Status: DC | PRN
  Administered 2018-08-10: 50 ug via INTRAVENOUS

## 2018-08-10 MED ORDER — SUGAMMADEX SODIUM 200 MG/2ML IV SOLN
INTRAVENOUS | Status: AC
  Filled 2018-08-10: qty 2

## 2018-08-10 MED ORDER — ROCURONIUM BROMIDE 50 MG/5ML IV SOLN
INTRAVENOUS | Status: AC
  Filled 2018-08-10: qty 1

## 2018-08-10 MED ORDER — LIDOCAINE HCL URETHRAL/MUCOSAL 2 % EX GEL
1.0000 "application " | Freq: Once | CUTANEOUS | Status: AC
  Administered 2018-08-10: 1 via URETHRAL
  Filled 2018-08-10: qty 10

## 2018-08-10 MED ORDER — MIDAZOLAM HCL 2 MG/2ML IJ SOLN
INTRAMUSCULAR | Status: DC | PRN
  Administered 2018-08-10: 2 mg via INTRAVENOUS

## 2018-08-10 MED ORDER — ROCURONIUM BROMIDE 100 MG/10ML IV SOLN
INTRAVENOUS | Status: DC | PRN
  Administered 2018-08-10: 15 mg via INTRAVENOUS
  Administered 2018-08-10: 5 mg via INTRAVENOUS

## 2018-08-10 MED ORDER — EPHEDRINE SULFATE 50 MG/ML IJ SOLN
INTRAMUSCULAR | Status: DC | PRN
  Administered 2018-08-10 (×2): 10 mg via INTRAVENOUS

## 2018-08-10 MED ORDER — SUCCINYLCHOLINE CHLORIDE 20 MG/ML IJ SOLN
INTRAMUSCULAR | Status: DC | PRN
  Administered 2018-08-10: 100 mg via INTRAVENOUS

## 2018-08-10 MED ORDER — FENTANYL CITRATE (PF) 100 MCG/2ML IJ SOLN
25.0000 ug | Freq: Once | INTRAMUSCULAR | Status: AC
  Administered 2018-08-10: 25 ug via INTRAVENOUS
  Filled 2018-08-10: qty 2

## 2018-08-10 MED ORDER — PROPOFOL 500 MG/50ML IV EMUL
INTRAVENOUS | Status: DC | PRN
  Administered 2018-08-10: 120 ug via INTRAVENOUS

## 2018-08-10 MED ORDER — LIDOCAINE HCL (PF) 2 % IJ SOLN
INTRAMUSCULAR | Status: AC
  Filled 2018-08-10: qty 10

## 2018-08-10 MED ORDER — CIPROFLOXACIN IN D5W 400 MG/200ML IV SOLN
400.0000 mg | Freq: Once | INTRAVENOUS | Status: AC
  Administered 2018-08-10: 400 mg via INTRAVENOUS
  Filled 2018-08-10: qty 200

## 2018-08-10 MED ORDER — MIDAZOLAM HCL 2 MG/2ML IJ SOLN
INTRAMUSCULAR | Status: AC
  Filled 2018-08-10: qty 2

## 2018-08-10 MED ORDER — EPHEDRINE SULFATE 50 MG/ML IJ SOLN
INTRAMUSCULAR | Status: AC
  Filled 2018-08-10: qty 1

## 2018-08-10 MED ORDER — ONDANSETRON HCL 4 MG/2ML IJ SOLN
INTRAMUSCULAR | Status: DC | PRN
  Administered 2018-08-10: 4 mg via INTRAVENOUS

## 2018-08-10 MED ORDER — ONDANSETRON HCL 4 MG/2ML IJ SOLN
INTRAMUSCULAR | Status: AC
  Filled 2018-08-10: qty 2

## 2018-08-10 MED ORDER — LACTATED RINGERS IV SOLN
INTRAVENOUS | Status: DC | PRN
  Administered 2018-08-10: 22:00:00 via INTRAVENOUS

## 2018-08-10 MED ORDER — FENTANYL CITRATE (PF) 100 MCG/2ML IJ SOLN
INTRAMUSCULAR | Status: AC
  Filled 2018-08-10: qty 2

## 2018-08-10 MED ORDER — SUCCINYLCHOLINE CHLORIDE 20 MG/ML IJ SOLN
INTRAMUSCULAR | Status: AC
  Filled 2018-08-10: qty 1

## 2018-08-10 MED ORDER — ACETAMINOPHEN-CODEINE #3 300-30 MG PO TABS: 1.0000 | ORAL_TABLET | ORAL | 2 refills | Status: DC | PRN

## 2018-08-10 MED ORDER — SUGAMMADEX SODIUM 200 MG/2ML IV SOLN
INTRAVENOUS | Status: DC | PRN
  Administered 2018-08-10: 200 mg via INTRAVENOUS

## 2018-08-10 MED ORDER — PROPOFOL 10 MG/ML IV BOLUS
INTRAVENOUS | Status: AC
  Filled 2018-08-10: qty 20

## 2018-08-10 SURGICAL SUPPLY — 26 items
BAG DRAIN CYSTO-URO LG1000N (MISCELLANEOUS) ×3 IMPLANT
CATH FOLEY 2WAY SIL 16X30 (CATHETERS) ×3 IMPLANT
COVER WAND RF STERILE (DRAPES) ×1 IMPLANT
ELECT REM PT RETURN 9FT ADLT (ELECTROSURGICAL) ×3
ELECTRODE REM PT RTRN 9FT ADLT (ELECTROSURGICAL) ×1 IMPLANT
GAUZE SPONGE 4X4 12PLY STRL (GAUZE/BANDAGES/DRESSINGS) ×2 IMPLANT
GLIDEWIRE STR 0.035 150CM 3CM (WIRE) ×2 IMPLANT
GLOVE BIO SURGEON STRL SZ7 (GLOVE) ×3 IMPLANT
GLOVE BIO SURGEON STRL SZ7.5 (GLOVE) ×3 IMPLANT
GOWN STRL REUS W/ TWL LRG LVL3 (GOWN DISPOSABLE) ×1 IMPLANT
GOWN STRL REUS W/ TWL XL LVL3 (GOWN DISPOSABLE) ×1 IMPLANT
GOWN STRL REUS W/TWL LRG LVL3 (GOWN DISPOSABLE) ×3
GOWN STRL REUS W/TWL XL LVL3 (GOWN DISPOSABLE) ×3
GUIDEWIRE STR ZIPWIRE 025X150 (MISCELLANEOUS) ×2 IMPLANT
KIT TURNOVER CYSTO (KITS) ×3 IMPLANT
NDL SPNL 18GX3.5 QUINCKE PK (NEEDLE) IMPLANT
NEEDLE SPNL 18GX3.5 QUINCKE PK (NEEDLE) ×3 IMPLANT
PACK CYSTO AR (MISCELLANEOUS) ×3 IMPLANT
SET CYSTO W/LG BORE CLAMP LF (SET/KITS/TRAYS/PACK) ×3 IMPLANT
SOL .9 NS 3000ML IRR  AL (IV SOLUTION) ×4
SOL .9 NS 3000ML IRR AL (IV SOLUTION) ×2
SOL .9 NS 3000ML IRR UROMATIC (IV SOLUTION) IMPLANT
SURGILUBE 2OZ TUBE FLIPTOP (MISCELLANEOUS) ×3 IMPLANT
WATER STERILE IRR 1000ML POUR (IV SOLUTION) ×3 IMPLANT
WIRE G 035X145 STIFF STR (WIRE) ×4 IMPLANT
WIRE G XSTIFF 025X145 (WIRE) ×2 IMPLANT

## 2018-08-10 NOTE — ED Notes (Signed)
Ciprofloxacin sent with patient to OR

## 2018-08-10 NOTE — Anesthesia Post-op Follow-up Note (Signed)
Anesthesia QCDR form completed.        

## 2018-08-10 NOTE — ED Notes (Signed)
Unsuccessful foley insertion by dorian. Unsuccessful coude catheter insertion by [employer]. Unsuccessful foley insertion by RN Bill/Jaiceon Collister via coude catheter

## 2018-08-10 NOTE — Progress Notes (Signed)
Emergency abruptio case cancelled in OR so anesthesia is now able to accommodate this case.

## 2018-08-10 NOTE — ED Triage Notes (Signed)
Patient reports decreased urine output for about a week. Family states he took his last dose of oral antibiotics for UTI on Monday. Patient reports lower abdominal pain and states "I just dribble". Patient seen by Dr. Ubaldo Glassing this morning for swelling in legs and feet. Told to take 40 mg lasix for 10 days to see if there was improvement. Family states even after taking lasix there has been no improvement of urine output.

## 2018-08-10 NOTE — Op Note (Signed)
Preoperative diagnosis: 1.  Urinary retention                                             2.  Urethral stricture and bladder neck contracture  Postoperative diagnosis: Same  Procedure: 1.  Cystoscopy                      2.  Suprapubic catheter placement  Surgeon: Otelia Limes. Yves Dill MD  Anesthesia: General  Indications:See the history and physical. After informed consent the above procedure(s) were requested     Technique and findings: After adequate general anesthesia been obtained the patient was placed into dorsal lithotomy position and the perineum was prepped and draped in usual fashion.  A 17 French cystoscope was coupled the camera and then visually advanced into the urethra.  The scope could only be advanced to approximately the bulbar urethra point stricture was encountered.  There appeared to be a less than 1 mm aperture at this point.  Numerous attempts were made to pass various types of guidewires through this aperture but were unsuccessful.  At this point the lower abdomen was prepped and draped in usual fashion.  Bladder was palpably distended to the level of the umbilicus.  2 finger breaths above the symphysis pubis in the midline and 18 French spinal needle was passed through the skin into the bladder.  The depth of the bladder was approximately 1 cm.  A 5 mm incision was made at the insertion site of the spinal needle.  The suprapubic trocar was then passed into the bladder and 16 French catheter placed.  Approximately 1500 cc of urine was obtained.  The Foley catheter was anchored to the skin with 3-0 nylon suture.  Sterile dressing was applied.  Procedure was then terminated and patient transferred to the recovery room in stable condition.

## 2018-08-10 NOTE — Anesthesia Preprocedure Evaluation (Signed)
Anesthesia Evaluation  Patient identified by MRN, date of birth, ID band Patient awake    Reviewed: Allergy & Precautions, NPO status , Patient's Chart, lab work & pertinent test results  History of Anesthesia Complications Negative for: history of anesthetic complications  Airway Mallampati: II       Dental  (+) Partial Lower   Pulmonary neg sleep apnea, neg COPD, former smoker,           Cardiovascular hypertension, Pt. on medications (-) Past MI and (-) CHF + dysrhythmias Atrial Fibrillation (-) Valvular Problems/Murmurs     Neuro/Psych neg Seizures CVA (R hand weakness), No Residual Symptoms    GI/Hepatic Neg liver ROS, GERD  Medicated and Controlled,  Endo/Other  neg diabetes  Renal/GU Renal disease (stones)     Musculoskeletal   Abdominal   Peds  Hematology   Anesthesia Other Findings   Reproductive/Obstetrics                             Anesthesia Physical Anesthesia Plan  ASA: III and emergent  Anesthesia Plan: General   Post-op Pain Management:    Induction: Intravenous  PONV Risk Score and Plan: 2 and Ondansetron and Dexamethasone  Airway Management Planned: Oral ETT  Additional Equipment:   Intra-op Plan:   Post-operative Plan:   Informed Consent: I have reviewed the patients History and Physical, chart, labs and discussed the procedure including the risks, benefits and alternatives for the proposed anesthesia with the patient or authorized representative who has indicated his/her understanding and acceptance.     Plan Discussed with:   Anesthesia Plan Comments:         Anesthesia Quick Evaluation

## 2018-08-10 NOTE — Progress Notes (Signed)
Brief consult  Called by Dr. Mariea Clonts to help assist with difficult Foley catheter placement in a patient in urinary retention.  Upon my arrival in discussing his history, he mentioned that he has a urologist, Dr. Yves Dill, who he has seen as recently as last week.  As a Orthoptist, I tried to place an 40 [place] Foley catheter x1 using lidojet.  I met resistance at the level of the prostate.  I am concerned that he likely has a false possible need to have a catheter scoped.    This was communicated with Dr. Mariea Clonts.  She will call his primary urologist, Dr. Yves Dill, to further assist his patient.  Hollice Espy, MD

## 2018-08-10 NOTE — ED Provider Notes (Signed)
[facility] Emergency Department Provider Note  ____________________________________________  Time seen: Approximately 6:11 PM  I have reviewed the triage vital signs and the nursing notes.   HISTORY  Chief Complaint Urinary Retention    HPI Matthew Hicks. is a 84 y.o. male presenting for urinary retention.  The patient reports that for the past week he has only been "dribbling."  Today, he developed lower abdominal pain and fullness.  He has not had any fevers or chills.  Last week, the patient was on an antibiotic, he does not remember which one, for UTI but his symptoms did not improve.  On arrival to the ER, the patient was noted to have bladder distention to above the umbilicus.  Foley placement with a 16 was attempted and failed, 16 and 14 cudes also failed.   Past Medical History:  Diagnosis Date  . A-fib ([place]) 04/08/2014  . Aortic stenosis   . Cardiomyopathy ([place])   . Cataract   . CVA (cerebral infarction) 2003  . Dysrhythmia   . GERD (gastroesophageal reflux disease)   . Hearing loss   . Hyperlipidemia   . Hypertension 05/04/2014  . Lung mass   . Macular degeneration   . Nephrolithiasis   . Pernicious anemia   . Pneumonia 05/2015  . Prostate cancer [facility]) 2007   performed by Dr. Eliberto Ivory  . Skin cancer   . Spinal stenosis   . Stroke [facility])     Patient Active Problem List   Diagnosis Date Noted  . Adrenal nodule ([place]) 07/15/2017  . Kidney stone on left side 05/23/2017  . Benign paroxysmal positional vertigo due to bilateral vestibular disorder 01/07/2017  . Pure hypercholesterolemia 05/13/2016  . History of stroke 11/14/2015  . Cerebral vascular accident ([place]) 07/02/2014  . BP (high blood pressure) 05/04/2014  . A-fib ([place]) 04/08/2014  . Benign hypertension 04/08/2014  . Aortic heart valve narrowing 04/08/2014  . Cardiomyopathy ([place]) 04/08/2014  . Acid reflux 04/08/2014  . HLD (hyperlipidemia) 04/08/2014  . PA (pernicious anemia)  04/08/2014  . Spinal stenosis 04/08/2014    Past Surgical History:  Procedure Laterality Date  . CATARACT EXTRACTION    . CIRCUMCISION  1994  . CYSTOSCOPY  1946  . CYSTOSCOPY W/ URETERAL STENT REMOVAL Left 06/15/2017   Procedure: CYSTOSCOPY WITH STENT REMOVAL, RETROGRADE, AND HOLMIUM LASER;  Surgeon: Royston Cowper, MD;  Location: [facility] ORS;  Service: Urology;  Laterality: Left;  . CYSTOSCOPY WITH STENT PLACEMENT Left 05/23/2017   Procedure: CYSTOSCOPY WITH STENT PLACEMENT;  Surgeon: Heron Sabins, MD;  Location: [facility] ORS;  Service: Urology;  Laterality: Left;  . laparoscopic prostectomy    . Spinal cyst removal    . TRANSURETHRAL RESECTION OF PROSTATE  2007   prostate cancer    Current Outpatient Rx  . Order #: [id number] Class: Print  . Order #: [id number] Class: Historical Med  . Order #: [id number] Class: Historical Med  . Order #: [id number] Class: Historical Med  . Order #: [id number] Class: Historical Med  . Order #: [id number] Class: Historical Med  . Order #: [id number] Class: Historical Med  . Order #: [id number] Class: Historical Med  . Order #: [id number] Class: Historical Med    Allergies Amoxicillin-pot clavulanate; Fesoterodine fumarate er; and Sulfa antibiotics  Family History  Problem Relation Age of Onset  . Stroke Father        heart disease  . Pneumonia Father   . Alzheimer's disease Brother   . Colon cancer Daughter 51  . Cancer - Other Paternal Aunt 30       "  GYN cancer"  . Cancer - Other Paternal Grandmother 5       "GYN cancer"    Social History Social History   Tobacco Use  . Smoking status: Former Smoker    Packs/day: 1.00    Years: 40.00    Pack years: 40.00    Types: Cigarettes    Last attempt to quit: 06/08/1977    Years since quitting: 41.2  . Smokeless tobacco: Never Used  . Tobacco comment: does not smoke now  Substance Use Topics  . Alcohol use: No    Alcohol/week: 0.0 standard drinks  . Drug use: No    Review of  Systems Constitutional: No fever/chills.  No lightheadedness or syncope. Eyes: No visual changes. ENT: No sore throat. No congestion or rhinorrhea. Cardiovascular: Denies chest pain. Denies palpitations. Respiratory: Denies shortness of breath.  No cough. Gastrointestinal: No abdominal pain.  Positive suprapubic fullness.  No nausea, no vomiting.  No diarrhea.  No constipation. Genitourinary: Negative for dysuria.  Positive urinary retention. Musculoskeletal: Negative for back pain. Skin: Negative for rash. Neurological: Negative for headaches. No focal numbness, tingling or weakness.     ____________________________________________   PHYSICAL EXAM:  VITAL SIGNS: ED Triage Vitals  Enc Vitals Group     BP 08/10/18 1641 (!) 162/72     Pulse Rate 08/10/18 1641 72     Resp 08/10/18 1641 (!) 22     Temp 08/10/18 1641 98 F (36.7 C)     Temp Source 08/10/18 1641 Oral     SpO2 08/10/18 1641 96 %     Weight 08/10/18 1649 156 lb (70.8 kg)     Height 08/10/18 1649 5\' 4"  (1.626 m)     Head Circumference --      Peak Flow --      Pain Score 08/10/18 1643 8     Pain Loc --      Pain Edu? --      Excl. in [place]? --     Constitutional: Alert and oriented.Answers questions appropriately.  Uncomfortable appearing. Eyes: Conjunctivae are normal.  EOMI. No scleral icterus. Head: Atraumatic. Nose: No congestion/rhinnorhea. Mouth/Throat: Mucous membranes are moist.  Neck: No stridor.  Supple.   Cardiovascular: Normal rate, regular rhythm. No murmurs, rubs or gallops.  Respiratory: Normal respiratory effort.  No accessory muscle use or retractions. Lungs CTAB.  No wheezes, rales or ronchi. Gastrointestinal: Soft, and nondistended.  The patient has tenderness to palpation with a palpable bladder to 1 cm above the umbilicus.  No guarding or rebound.  No peritoneal signs. Genitourinary: Normal appearing penis, testicles and scrotum with some minimal blood at the meatus after multiple Foley  attempts have been made. Musculoskeletal: No LE edema.  Neurologic:  A&Ox3.  Speech is clear.  Face and smile are symmetric.  EOMI.  Moves all extremities well. Skin:  Skin is warm, dry and intact. No rash noted. Psychiatric: Mood and affect are normal. Speech and behavior are normal.  Normal judgement.  ____________________________________________   LABS (all labs ordered are listed, but only abnormal results are displayed)  Labs Reviewed  CBC - Abnormal; Notable for the following components:      Result Value   WBC 13.8 (*)    Hemoglobin 11.6 (*)    HCT 36.4 (*)    All other components within normal limits  BASIC METABOLIC PANEL - Abnormal; Notable for the following components:   Potassium 3.0 (*)    Glucose, Bld 105 (*)    Calcium 7.9 (*)  GFR calc non Af Amer 56 (*)    All other components within normal limits  URINALYSIS, COMPLETE (UACMP) WITH MICROSCOPIC   ____________________________________________  EKG  Not indicated. ____________________________________________  RADIOLOGY  No results found.  ____________________________________________   PROCEDURES  Procedure(s) performed: None  Procedures  Critical Care performed: No ____________________________________________   INITIAL IMPRESSION / ASSESSMENT AND PLAN / ED COURSE  Pertinent labs & imaging results that were available during my care of the patient were reviewed by me and considered in my medical decision making (see chart for details).  84 y.o. male without any history of prostatic or genitourinary issues other than remote nephrolithiasis presenting with urinary retention.  I am concerned that the patient is retaining a significant amount of urine.  A creatinine is pending.  I have called Dr. Hollice Espy, the urologist on-call, for decompression.  Reevaluation for final disposition.  ----------------------------------------- 8:49 PM on  08/10/2018 -----------------------------------------  The patient's creatinine is reassuring at 1.11.  He does have an elevated white blood cell count and given the delay in being able to decompress his urinary retention, I have ordered ciprofloxacin for the possibility of UTI.  The patient has had failure of urinary decompression by multiple nurses, Dr. Hollice Espy, now Dr. Eliberto Ivory, his primary urologist.  He does need the operating room for scoping, but the operating rooms at [facility] are all in use and unable to accommodate his need at this time.  We have initiated transfer to [facility] for urologic decompression.  ____________________________________________  FINAL CLINICAL IMPRESSION(S) / ED DIAGNOSES  Final diagnoses:  Urinary retention         NEW MEDICATIONS STARTED DURING THIS VISIT:  New Prescriptions   No medications on file      Eula Listen, MD 08/10/18 2051

## 2018-08-10 NOTE — Transfer of Care (Signed)
Immediate Anesthesia Transfer of Care Note  Patient: Matthew Hicks.  Procedure(s) Performed: CYSTOSCOPY, superpubic catheter placement (N/A )  Patient Location: PACU  Anesthesia Type:General  Level of Consciousness: awake  Airway & Oxygen Therapy: Patient Spontanous Breathing and Patient connected to face mask oxygen  Post-op Assessment: Report given to RN and Post -op Vital signs reviewed and stable  Post vital signs: Reviewed  Last Vitals:  Vitals Value Taken Time  BP 134/73 08/10/2018 11:54 PM  Temp 36.4 C 08/10/2018 11:54 PM  Pulse 57 08/10/2018 11:55 PM  Resp 15 08/10/2018 11:55 PM  SpO2 100 % 08/10/2018 11:55 PM  Vitals shown include unvalidated device data.  Last Pain:  Vitals:   08/10/18 2130  TempSrc:   PainSc: 7          Complications: No apparent anesthesia complications

## 2018-08-10 NOTE — Anesthesia Procedure Notes (Signed)
Procedure Name: Intubation Date/Time: 08/10/2018 10:27 PM Performed by: Gunnar Fusi, MD Pre-anesthesia Checklist: Patient identified, Patient being monitored, Timeout performed, Emergency Drugs available and Suction available Patient Re-evaluated:Patient Re-evaluated prior to induction Oxygen Delivery Method: Circle system utilized Preoxygenation: Pre-oxygenation with 100% oxygen Induction Type: IV induction Ventilation: Mask ventilation without difficulty Laryngoscope Size: Mac and 3 Grade View: Grade II Tube type: Oral Tube size: 7.0 mm Number of attempts: 1 Placement Confirmation: ETT inserted through vocal cords under direct vision,  positive ETCO2 and breath sounds checked- equal and bilateral Secured at: 21 cm Tube secured with: Tape Dental Injury: Teeth and Oropharynx as per pre-operative assessment

## 2018-08-10 NOTE — ED Notes (Signed)
Unsuccessful 4th attempt at foley insertion. MD Made aware

## 2018-08-10 NOTE — Consult Note (Signed)
Urology Consult  Referring physician: Norman Reason for referral: Inability to place Foley catheter  Chief Complaint: Patient presented to emergency room with dribbling of urine and bladder distention.  History of Present Illness: Patient was given initial doses of Lasix today prompting diuresis. He has not been able to void well today and presented to the emergency room and urinary retention.  He has a long history of dribbling incontinence.  He has a history of BPH and is status post TURP in 2007.  Dr. Brandon was unable to place a an 18 French Foley catheter earlier today.  Bladder scan revealed a residual of greater than 1000 cc  Past Medical History:  Diagnosis Date  . A-fib ([facility]) 04/08/2014  . Aortic stenosis   . Cardiomyopathy ([facility])   . Cataract   . CVA (cerebral infarction) 2003  . Dysrhythmia   . GERD (gastroesophageal reflux disease)   . Hearing loss   . Hyperlipidemia   . Hypertension 05/04/2014  . Lung mass   . Macular degeneration   . Nephrolithiasis   . Pernicious anemia   . Pneumonia 05/2015  . Prostate cancer ([facility]) 2007   performed by Dr. Wolf  . Skin cancer   . Spinal stenosis   . Stroke ([facility])    Past Surgical History:  Procedure Laterality Date  . CATARACT EXTRACTION    . CIRCUMCISION  1994  . CYSTOSCOPY  1946  . CYSTOSCOPY W/ URETERAL STENT REMOVAL Left 06/15/2017   Procedure: CYSTOSCOPY WITH STENT REMOVAL, RETROGRADE, AND HOLMIUM LASER;  Surgeon: ,  R, MD;  Location: [facility] ORS;  Service: Urology;  Laterality: Left;  . CYSTOSCOPY WITH STENT PLACEMENT Left 05/23/2017   Procedure: CYSTOSCOPY WITH STENT PLACEMENT;  Surgeon: Winship, Brenton, MD;  Location: [facility] ORS;  Service: Urology;  Laterality: Left;  . laparoscopic prostectomy    . Spinal cyst removal    . TRANSURETHRAL RESECTION OF PROSTATE  2007   prostate cancer    Medications: I have reviewed the patient's current medications. Allergies:  Allergies  Allergen Reactions  . Amoxicillin-Pot  Clavulanate Swelling    Angioedema Has patient had a PCN reaction causing immediate rash, facial/tongue/throat swelling, SOB or lightheadedness with hypotension: Yes Has patient had a PCN reaction causing severe rash involving mucus membranes or skin necrosis: No Has patient had a PCN reaction that required hospitalization: No Has patient had a PCN reaction occurring within the last 10 years: Yes If all of the above answers are "NO", then may proceed with Cephalosporin use.   . Fesoterodine Fumarate Er Hives and Rash  . Sulfa Antibiotics Rash    Family History  Problem Relation Age of Onset  . Stroke Father        heart disease  . Pneumonia Father   . Alzheimer's disease Brother   . Colon cancer Daughter 48  . Cancer - Other Paternal Aunt 50       "GYN cancer"  . Cancer - Other Paternal Grandmother 50       "GYN cancer"   Social History:  reports that he quit smoking about 41 years ago. His smoking use included cigarettes. He has a 40.00 pack-year smoking history. He has never used smokeless tobacco. He reports that he does not drink alcohol or use drugs.  Review of Systems  Constitutional: Negative.   HENT: Positive for hearing loss.   Cardiovascular: Positive for leg swelling. Negative for chest pain.  Gastrointestinal: Positive for abdominal pain.  Genitourinary: Positive for frequency.       Physical Exam:  Vital signs in last 24 hours: Temp:  [98 F (36.7 C)] 98 F (36.7 C) (10/30 1641) Pulse Rate:  [46-80] 46 (10/30 1900) Resp:  [15-22] 20 (10/30 1900) BP: (162-177)/(72-97) 177/97 (10/30 1900) SpO2:  [88 %-96 %] 88 % (10/30 1900) Weight:  [70.8 kg] 70.8 kg (10/30 1649) Physical Exam  GI: He exhibits distension.  Genitourinary: No penile tenderness.     Laboratory Data:  Results for orders placed or performed during the hospital encounter of 08/10/18 (from the past 72 hour(s))  CBC     Status: Abnormal   Collection Time: 08/10/18  6:08 PM  Result Value Ref  Range   WBC 13.8 (H) 4.0 - 10.5 K/uL   RBC 4.44 4.22 - 5.81 MIL/uL   Hemoglobin 11.6 (L) 13.0 - 17.0 g/dL   HCT 36.4 (L) 39.0 - 52.0 %   MCV 82.0 80.0 - 100.0 fL   MCH 26.1 26.0 - 34.0 pg   MCHC 31.9 30.0 - 36.0 g/dL   RDW 14.7 11.5 - 15.5 %   Platelets 321 150 - 400 K/uL   nRBC 0.0 0.0 - 0.2 %    Comment: Performed at [facility], [address] [place], [place] [address]  Basic metabolic panel     Status: Abnormal   Collection Time: 08/10/18  6:08 PM  Result Value Ref Range   Sodium 136 135 - 145 mmol/L   Potassium 3.0 (L) 3.5 - 5.1 mmol/L   Chloride 100 98 - 111 mmol/L   CO2 23 22 - 32 mmol/L   Glucose, Bld 105 (H) 70 - 99 mg/dL   BUN 15 8 - 23 mg/dL   Creatinine, Ser 1.11 0.61 - 1.24 mg/dL   Calcium 7.9 (L) 8.9 - 10.3 mg/dL   GFR calc non Af Amer 56 (L) >60 mL/min   GFR calc Af Amer >60 >60 mL/min    Comment: (NOTE) The eGFR has been calculated using the CKD EPI equation. This calculation has not been validated in all clinical situations. eGFR's persistently <60 mL/min signify possible Chronic Kidney Disease.    Anion gap 13 5 - 15    Comment: Performed at [facility], [address] [place], [place] [address]   No results found for this or any previous visit (from the past 240 hour(s)). Creatinine: Recent Labs    08/10/18 1808  CREATININE 1.11   Baseline Creatinine: 1.11  Impression/Assessment:  1.  Urinary retention 2.  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  Congestive heart failure  Plan:  1.  10 cc of viscous Xylocaine was instilled within the urethra.  Attempts at placing 18 French, 16 French and 14 French coud catheters were not successful.  Resistance was encountered near the bladder neck or bulbar urethra 2.  I contacted the nurse supervisor to proceed with emergency surgery however the OR supervisor informed me that the anesthesiologist on duty (Dr. Kephart) could not accommodate surgery for at least 6  hours due to ongoing emergent surgical procedures procedures in the OR.  The nurse supervisor suggested that I transfer patient to another facility to accommodate emergent urological surgery.  I discussed the situation with the patient and his nephew and they agreed to proceed with transfer.  They preferred transfer to [facility].   R  08/10/2018, 8:47 PM     

## 2018-08-10 NOTE — Discharge Instructions (Addendum)
Suprapubic Catheter Home Guide A suprapubic catheter is a rubber tube used to drain urine from the bladder into a collection bag. The catheter is inserted into the bladder through a small opening in the in the lower abdomen, near the center of the body, above the pubic bone (suprapubic area). There is a tiny balloon filled with germ-free (sterile) water on the end of the catheter that is in the bladder. The balloon helps to keep the catheter in place. Your suprapubic catheter may need to be replaced every 4-6 weeks, or as often as recommended by your health care provider. The collection bag must be emptied every day and cleaned every 2-3 days. The collection bag can be put beside your bed at night and attached to your leg during the day. You may have a large collection bag to use at night and a smaller one to use during the day. What are the risks?  Urine flow can become blocked. This can happen if the catheter is not working correctly, or if you have a blood clot in your bladder or in the catheter.  Tissue near the catheter may can become irritated and bleed.  Bacteria may get into your bladder and cause a urinary tract infection. How do I change the catheter? Supplies needed  Two pairs of sterile gloves.  Catheter.  Two syringes.  Sterile water.  Sterile cleaning solution.  Lubricant.  Collection bags. Changing the catheter To replace your catheter, take the following steps: 1. Drink plenty of fluids during the hours before you plan to change the catheter. 2. Wash your hands with soap and water. If soap and water are not available, use hand sanitizer. 3. Lie on your back and put on sterile gloves. 4. Clean the skin around the catheter opening using the sterile cleaning solution. 5. Remove the water from the balloon using a syringe. 6. Slowly remove the catheter. ? Do not pull on the catheter if it seems stuck. ? Call your health care provider immediately if you have difficulty  removing the catheter. 7. Take off the used gloves, and put on a new pair. 8. Put lubricant on the end of the new catheter that will go into your bladder. 9. Gently slide the catheter through the opening in your abdomen and into your bladder. 10. Wait for some urine to start flowing through the catheter. When urine starts to flow through the catheter, use a new syringe to fill the balloon with sterile water. 11. Attach the collection bag to the end of the catheter. Make sure the connection is tight. 12. Remove the gloves and wash your hands with soap and water.  How do I care for my skin around the catheter? Use a clean washcloth and soapy water to clean the skin around your catheter every day. Pat the area dry with a clean towel.  Do not pull on the catheter.  Do not use ointment or lotion on this area unless told by your health care provider.  Check your skin around the catheter every day for signs of infection. Check for: ? Redness, swelling, or pain. ? Fluid or blood. ? Warmth. ? Pus or a bad smell.  How do I clean and empty the collection bag? Clean the collection bag every 2-3 days, or as often as told by your health care provider. To do this, take the following steps:  Wash your hands with soap and water. If soap and water are not available, use hand sanitizer.  Disconnect the bag  from the catheter and immediately attach a new bag to the catheter.  Empty the used bag completely.  Clean the used bag using one of the following methods: ? Rinse the used bag with warm water and soap. ? Fill the bag with water and add 1 tsp of vinegar. Let it sit for about 30 minutes, then empty the bag.  Let the bag dry completely, and put it in a clean plastic bag before storing it.  Empty the large collection bag every 8 hours. Empty the small collection bag when it is about ? full. To empty your large or small collection bag, take the following steps:  Always keep the bag below the level  of the catheter. This keeps urine from flowing backwards into the catheter.  Hold the bag over the toilet or another container. Turn the valve (spigot) at the bottom of the bag to empty the urine. ? Do not touch the opening of the spigot. ? Do not let the opening touch the toilet or container.  Close the spigot tightly when the bag is empty.  What are some general tips?  Always wash your hands before and after caring for your catheter and collection bag. Use a mild, fragrance-free soap. If soap and water are not available, use hand sanitizer.  Clean the catheter with soap and water as often as told by your health care provider.  Always make sure there are no twists or curls (kinks) in the catheter tube.  Always make sure there are no leaks in the catheter or collection bag.  Drink enough fluid to keep your urine clear or pale yellow.  Do not take baths, swim, or use a hot tub. When should I seek medical care? Seek medical care if:  You leak urine.  You have redness, swelling, or pain around your catheter opening.  You have fluid or blood coming from your catheter opening.  Your catheter opening feels warm to the touch.  You have pus or a bad smell coming from your catheter opening.  You have a fever or chills.  Your urine flow slows down.  Your urine becomes cloudy or smelly.  When should I seek immediate medical care? Seek immediate medical care if your catheter comes out, or if you have:  Nausea.  Back pain.  Difficulty changing your catheter.  Blood in your urine.  No urine flow for 1 hour.  This information is not intended to replace advice given to you by your health care provider. Make sure you discuss any questions you have with your health care provider. Document Released: 06/16/2011 Document Revised: 05/27/2016 Document Reviewed: 06/11/2015 Elsevier Interactive Patient Education  2018 [place]   1) The drugs that you were given will stay in your system until tomorrow so for the next 24 hours you should not:  A) Drive an automobile B) Make any legal decisions C) Drink any alcoholic beverage   2) You may resume regular meals tomorrow.  Today it is better to start with liquids and gradually work up to solid foods.  You may eat anything you prefer, but it is better to start with liquids, then soup and crackers, and gradually work up to solid foods.   3) Please notify your doctor immediately if you have any unusual bleeding, trouble breathing, redness and pain at the surgery site, drainage, fever, or pain not relieved by medication.    4) Additional Instructions:  Additional Instructions: ° ° ° ° ° ° ° °Please contact your physician with any problems or Same Day Surgery at [phone], Monday through Friday 6 am to 4 pm, or [facility] [place] Main number at [phone]. ° ° ° ° °

## 2018-08-11 ENCOUNTER — Encounter: Payer: Self-pay | Admitting: Urology

## 2018-08-11 DIAGNOSIS — R338 Other retention of urine: Secondary | ICD-10-CM | POA: Diagnosis not present

## 2018-08-11 MED ORDER — FENTANYL CITRATE (PF) 100 MCG/2ML IJ SOLN: 25.0000 ug | INTRAMUSCULAR | Status: DC | PRN

## 2018-08-11 MED ORDER — ONDANSETRON HCL 4 MG/2ML IJ SOLN: 4.0000 mg | Freq: Once | INTRAMUSCULAR | Status: DC | PRN

## 2018-08-11 NOTE — Anesthesia Postprocedure Evaluation (Signed)
Anesthesia Post Note  Patient: Matthew Hicks.  Procedure(s) Performed: CYSTOSCOPY, superpubic catheter placement (N/A )  Patient location during evaluation: PACU Anesthesia Type: General Level of consciousness: awake and alert Pain management: pain level controlled Vital Signs Assessment: post-procedure vital signs reviewed and stable Respiratory status: spontaneous breathing and respiratory function stable Cardiovascular status: stable Anesthetic complications: no     Last Vitals:  Vitals:   08/11/18 0013 08/11/18 0015  BP:  130/64  Pulse: (!) 56 65  Resp: 17 16  Temp:    SpO2: 94% 96%    Last Pain:  Vitals:   08/11/18 0015  TempSrc:   PainSc: 0-No pain                 KEPHART,WILLIAM K

## 2018-08-16 ENCOUNTER — Ambulatory Visit: Payer: Medicare Other

## 2018-08-22 ENCOUNTER — Ambulatory Visit: Payer: Self-pay | Admitting: Cardiothoracic Surgery

## 2018-10-05 ENCOUNTER — Encounter: Payer: Self-pay | Admitting: Emergency Medicine

## 2018-10-05 ENCOUNTER — Other Ambulatory Visit: Payer: Self-pay

## 2018-10-05 ENCOUNTER — Emergency Department
Admission: EM | Admit: 2018-10-05 | Discharge: 2018-10-05 | Disposition: A | Discharge status: Home / Self Care | Payer: Medicare Other | Attending: Emergency Medicine | Admitting: Emergency Medicine

## 2018-10-05 DIAGNOSIS — R109 Unspecified abdominal pain: Secondary | ICD-10-CM | POA: Insufficient documentation

## 2018-10-05 DIAGNOSIS — T83010A Breakdown (mechanical) of cystostomy catheter, initial encounter: Secondary | ICD-10-CM | POA: Insufficient documentation

## 2018-10-05 DIAGNOSIS — Z87891 Personal history of nicotine dependence: Secondary | ICD-10-CM | POA: Diagnosis not present

## 2018-10-05 DIAGNOSIS — I10 Essential (primary) hypertension: Secondary | ICD-10-CM | POA: Diagnosis not present

## 2018-10-05 DIAGNOSIS — R39198 Other difficulties with micturition: Secondary | ICD-10-CM | POA: Diagnosis not present

## 2018-10-05 DIAGNOSIS — Y69 Unspecified misadventure during surgical and medical care: Secondary | ICD-10-CM | POA: Diagnosis not present

## 2018-10-05 DIAGNOSIS — Z8546 Personal history of malignant neoplasm of prostate: Secondary | ICD-10-CM | POA: Insufficient documentation

## 2018-10-05 DIAGNOSIS — Z8673 Personal history of transient ischemic attack (TIA), and cerebral infarction without residual deficits: Secondary | ICD-10-CM | POA: Diagnosis not present

## 2018-10-05 LAB — URINALYSIS, COMPLETE (UACMP) WITH MICROSCOPIC
BACTERIA UA: NONE SEEN
Bilirubin Urine: NEGATIVE
GLUCOSE, UA: NEGATIVE mg/dL
KETONES UR: NEGATIVE mg/dL
NITRITE: POSITIVE — AB
PH: 9 — AB (ref 5.0–8.0)
Protein, ur: 100 mg/dL — AB
Specific Gravity, Urine: 1.009 (ref 1.005–1.030)
Squamous Epithelial / LPF: NONE SEEN (ref 0–5)

## 2018-10-05 MED ORDER — CEPHALEXIN 250 MG PO CAPS: 250.0000 mg | ORAL_CAPSULE | Freq: Four times a day (QID) | ORAL | 0 refills | Status: AC

## 2018-10-05 NOTE — ED Notes (Signed)
Pt family had requested to be discharged - advised family that I would do a partial dispo and then once the paperwork for discharge was completed they could leave - signature of pt son obtained - when provider went to room to discharge pt they had left without discussion with physician

## 2018-10-05 NOTE — ED Provider Notes (Addendum)
[facility] Emergency Department Provider Note       Time seen: ----------------------------------------- 11:08 AM on 10/05/2018 -----------------------------------------   I have reviewed the triage vital signs and the nursing notes.  HISTORY   Chief Complaint Abdominal Pain  HPI Addiel Mccardle. is a 84 y.o. male with a history of atrial fibrillation, aortic stenosis, cardiomyopathy, GERD, hyperlipidemia, hypertension, pneumonia, suprapubic catheter who presents to the ED for Foley catheter problems.  Patient's had a suprapubic catheter and he has not been passing urine.  He was complaining of abdominal pain and feeling like he needed to urinate.  Past Medical History:  Diagnosis Date  . A-fib ([place]) 04/08/2014  . Aortic stenosis   . Cardiomyopathy ([place])   . Cataract   . CVA (cerebral infarction) 2003  . Dysrhythmia   . GERD (gastroesophageal reflux disease)   . Hearing loss   . Hyperlipidemia   . Hypertension 05/04/2014  . Lung mass   . Macular degeneration   . Nephrolithiasis   . Pernicious anemia   . Pneumonia 05/2015  . Prostate cancer [facility]) 2007   performed by Dr. Eliberto Ivory  . Skin cancer   . Spinal stenosis   . Stroke [facility])     Patient Active Problem List   Diagnosis Date Noted  . Adrenal nodule ([place]) 07/15/2017  . Kidney stone on left side 05/23/2017  . Benign paroxysmal positional vertigo due to bilateral vestibular disorder 01/07/2017  . Pure hypercholesterolemia 05/13/2016  . History of stroke 11/14/2015  . Cerebral vascular accident ([place]) 07/02/2014  . BP (high blood pressure) 05/04/2014  . A-fib ([place]) 04/08/2014  . Benign hypertension 04/08/2014  . Aortic heart valve narrowing 04/08/2014  . Cardiomyopathy ([place]) 04/08/2014  . Acid reflux 04/08/2014  . HLD (hyperlipidemia) 04/08/2014  . PA (pernicious anemia) 04/08/2014  . Spinal stenosis 04/08/2014    Past Surgical History:  Procedure Laterality Date  . CATARACT EXTRACTION     . CIRCUMCISION  1994  . CYSTOSCOPY  1946  . CYSTOSCOPY N/A 08/10/2018   Procedure: CYSTOSCOPY, superpubic catheter placement;  Surgeon: Royston Cowper, MD;  Location: [facility] ORS;  Service: Urology;  Laterality: N/A;  . CYSTOSCOPY W/ URETERAL STENT REMOVAL Left 06/15/2017   Procedure: CYSTOSCOPY WITH STENT REMOVAL, RETROGRADE, AND HOLMIUM LASER;  Surgeon: Royston Cowper, MD;  Location: [facility] ORS;  Service: Urology;  Laterality: Left;  . CYSTOSCOPY WITH STENT PLACEMENT Left 05/23/2017   Procedure: CYSTOSCOPY WITH STENT PLACEMENT;  Surgeon: Heron Sabins, MD;  Location: [facility] ORS;  Service: Urology;  Laterality: Left;  . laparoscopic prostectomy    . Spinal cyst removal    . TRANSURETHRAL RESECTION OF PROSTATE  2007   prostate cancer    Allergies Amoxicillin-pot clavulanate; Fesoterodine fumarate er; and Sulfa antibiotics  Social History Social History   Tobacco Use  . Smoking status: Former Smoker    Packs/day: 1.00    Years: 40.00    Pack years: 40.00    Types: Cigarettes    Last attempt to quit: 06/08/1977    Years since quitting: 41.3  . Smokeless tobacco: Never Used  . Tobacco comment: does not smoke now  Substance Use Topics  . Alcohol use: No    Alcohol/week: 0.0 standard drinks  . Drug use: No   Review of Systems Constitutional: Negative for fever. Gastrointestinal: Positive for abdominal pain Genitourinary: Positive for dysuria, urinary retention Musculoskeletal: Negative for back pain. Skin: Negative for rash. Neurological: Negative for headaches, focal weakness or numbness.  All  systems negative/normal/unremarkable except as stated in the HPI  ____________________________________________   PHYSICAL EXAM:  VITAL SIGNS: ED Triage Vitals  Enc Vitals Group     BP 10/05/18 1039 (!) 179/97     Pulse Rate 10/05/18 1039 73     Resp 10/05/18 1039 18     Temp 10/05/18 1039 98.2 F (36.8 C)     Temp Source 10/05/18 1039 Oral     SpO2 10/05/18 1039 98 %      Weight 10/05/18 1043 148 lb (67.1 kg)     Height 10/05/18 1043 5\' 4"  (1.626 m)     Head Circumference --      Peak Flow --      Pain Score --      Pain Loc --      Pain Edu? --      Excl. in [place]? --    Constitutional: Mild distress Cardiovascular: Normal rate, regular rhythm. No murmurs, rubs, or gallops. Respiratory: Normal respiratory effort without tachypnea nor retractions. Breath sounds are clear and equal bilaterally. No wheezes/rales/rhonchi. Gastrointestinal: Suprapubic tenderness and fullness, bladder distention is noted.  Suprapubic catheter appears to be in proper position but the bulb appears to be subcutaneous. Musculoskeletal: Nontender with normal range of motion in extremities. No lower extremity tenderness nor edema. Neurologic:  Normal speech and language. No gross focal neurologic deficits are appreciated.  Skin:  Skin is warm, dry and intact. No rash noted. ____________________________________________  ED COURSE:  As part of my medical decision making, I reviewed the following data within the [place] History obtained from family if available, nursing notes, old chart and ekg, as well as notes from prior ED visits. Patient presented for catheter dysfunction, we will assess with labs as indicated at this time.   Procedures ____________________________________________   LABS (pertinent positives/negatives)  Labs Reviewed  URINALYSIS, COMPLETE (UACMP) WITH MICROSCOPIC - Abnormal; Notable for the following components:      Result Value   Color, Urine YELLOW (*)    APPearance CLOUDY (*)    pH 9.0 (*)    Hgb urine dipstick LARGE (*)    Protein, ur 100 (*)    Nitrite POSITIVE (*)    Leukocytes, UA LARGE (*)    RBC / HPF >50 (*)    WBC, UA >50 (*)    Non Squamous Epithelial PRESENT (*)    All other components within normal limits  URINE CULTURE   ____________________________________________  DIFFERENTIAL DIAGNOSIS   Catheter blockage,  UTI, urinary retention  FINAL ASSESSMENT AND PLAN  Suprapubic catheter dysfunction   Plan: The patient had presented for suprapubic catheter dysfunction.  Suprapubic catheter was changed out by me.  I placed a 38 [place].  Immediately after removing his previous catheter he began having profuse urination through his suprapubic catheter site until I could place the new catheter.  We then fully drained the bladder and sent a urine culture.  He is cleared for outpatient follow-up with urology.   Laurence Aly, MD   Note: This note was generated in part or whole with voice recognition software. Voice recognition is usually quite accurate but there are transcription errors that can and very often do occur. I apologize for any typographical errors that were not detected and corrected.     Earleen Newport, MD 10/05/18 1215    Earleen Newport, MD 10/05/18 [phone]

## 2018-10-05 NOTE — ED Notes (Signed)
Left HIPPA compliant message for Sohrab Keelan (son of pt) to return call so that he could be notified that an rx has been sent to the pharmacy for pick up

## 2018-10-05 NOTE — ED Notes (Signed)
Pt son notified that rx had been sent to pharmacy for antibiotic

## 2018-10-05 NOTE — ED Triage Notes (Signed)
Has foley catheter x 2 months due to "blockages". Son noticed not passing urine through catheter this am and dad stating abdominal pain.

## 2018-10-05 NOTE — ED Provider Notes (Signed)
We were planning on discharge with antibiotics.  Family left prior to discharge papers and antibiotics.   Earleen Newport, MD 10/05/18 [phone]

## 2018-10-07 LAB — URINE CULTURE: SPECIAL REQUESTS: NORMAL

## 2018-10-17 ENCOUNTER — Encounter (INDEPENDENT_AMBULATORY_CARE_PROVIDER_SITE_OTHER): Payer: Medicare Other | Admitting: Ophthalmology

## 2018-10-17 DIAGNOSIS — H43813 Vitreous degeneration, bilateral: Secondary | ICD-10-CM | POA: Diagnosis not present

## 2018-10-17 DIAGNOSIS — H353231 Exudative age-related macular degeneration, bilateral, with active choroidal neovascularization: Secondary | ICD-10-CM | POA: Diagnosis not present

## 2018-11-16 ENCOUNTER — Inpatient Hospital Stay
Admission: EM | Admit: 2018-11-16 | Discharge: 2018-11-20 | DRG: 481 | Disposition: A | Discharge status: Skilled Nursing Facility | Payer: Medicare Other | Attending: Internal Medicine | Admitting: Internal Medicine

## 2018-11-16 ENCOUNTER — Other Ambulatory Visit: Payer: Self-pay

## 2018-11-16 ENCOUNTER — Emergency Department: Payer: Medicare Other

## 2018-11-16 DIAGNOSIS — Z87891 Personal history of nicotine dependence: Secondary | ICD-10-CM | POA: Diagnosis not present

## 2018-11-16 DIAGNOSIS — W19XXXA Unspecified fall, initial encounter: Secondary | ICD-10-CM

## 2018-11-16 DIAGNOSIS — K219 Gastro-esophageal reflux disease without esophagitis: Secondary | ICD-10-CM | POA: Diagnosis present

## 2018-11-16 DIAGNOSIS — H9190 Unspecified hearing loss, unspecified ear: Secondary | ICD-10-CM | POA: Diagnosis present

## 2018-11-16 DIAGNOSIS — Z882 Allergy status to sulfonamides status: Secondary | ICD-10-CM

## 2018-11-16 DIAGNOSIS — E039 Hypothyroidism, unspecified: Secondary | ICD-10-CM | POA: Diagnosis present

## 2018-11-16 DIAGNOSIS — Z419 Encounter for procedure for purposes other than remedying health state, unspecified: Secondary | ICD-10-CM

## 2018-11-16 DIAGNOSIS — Z888 Allergy status to other drugs, medicaments and biological substances status: Secondary | ICD-10-CM

## 2018-11-16 DIAGNOSIS — Z7901 Long term (current) use of anticoagulants: Secondary | ICD-10-CM | POA: Diagnosis not present

## 2018-11-16 DIAGNOSIS — Z823 Family history of stroke: Secondary | ICD-10-CM

## 2018-11-16 DIAGNOSIS — W010XXA Fall on same level from slipping, tripping and stumbling without subsequent striking against object, initial encounter: Secondary | ICD-10-CM | POA: Diagnosis present

## 2018-11-16 DIAGNOSIS — Z820 Family history of epilepsy and other diseases of the nervous system: Secondary | ICD-10-CM | POA: Diagnosis not present

## 2018-11-16 DIAGNOSIS — E785 Hyperlipidemia, unspecified: Secondary | ICD-10-CM | POA: Diagnosis present

## 2018-11-16 DIAGNOSIS — Z800 Family history of malignant neoplasm of digestive organs: Secondary | ICD-10-CM

## 2018-11-16 DIAGNOSIS — I350 Nonrheumatic aortic (valve) stenosis: Secondary | ICD-10-CM | POA: Diagnosis present

## 2018-11-16 DIAGNOSIS — Z85828 Personal history of other malignant neoplasm of skin: Secondary | ICD-10-CM | POA: Diagnosis not present

## 2018-11-16 DIAGNOSIS — Z880 Allergy status to penicillin: Secondary | ICD-10-CM | POA: Diagnosis not present

## 2018-11-16 DIAGNOSIS — H3530 Unspecified macular degeneration: Secondary | ICD-10-CM | POA: Diagnosis present

## 2018-11-16 DIAGNOSIS — I10 Essential (primary) hypertension: Secondary | ICD-10-CM | POA: Diagnosis present

## 2018-11-16 DIAGNOSIS — Z8546 Personal history of malignant neoplasm of prostate: Secondary | ICD-10-CM

## 2018-11-16 DIAGNOSIS — M25552 Pain in left hip: Secondary | ICD-10-CM | POA: Diagnosis present

## 2018-11-16 DIAGNOSIS — Z79890 Hormone replacement therapy (postmenopausal): Secondary | ICD-10-CM

## 2018-11-16 DIAGNOSIS — Z87442 Personal history of urinary calculi: Secondary | ICD-10-CM

## 2018-11-16 DIAGNOSIS — Z79891 Long term (current) use of opiate analgesic: Secondary | ICD-10-CM

## 2018-11-16 DIAGNOSIS — S60512A Abrasion of left hand, initial encounter: Secondary | ICD-10-CM | POA: Diagnosis present

## 2018-11-16 DIAGNOSIS — Z8673 Personal history of transient ischemic attack (TIA), and cerebral infarction without residual deficits: Secondary | ICD-10-CM | POA: Diagnosis not present

## 2018-11-16 DIAGNOSIS — I482 Chronic atrial fibrillation, unspecified: Secondary | ICD-10-CM | POA: Diagnosis present

## 2018-11-16 DIAGNOSIS — Z79899 Other long term (current) drug therapy: Secondary | ICD-10-CM | POA: Diagnosis not present

## 2018-11-16 DIAGNOSIS — S72142A Displaced intertrochanteric fracture of left femur, initial encounter for closed fracture: Secondary | ICD-10-CM | POA: Diagnosis present

## 2018-11-16 DIAGNOSIS — S50312A Abrasion of left elbow, initial encounter: Secondary | ICD-10-CM | POA: Diagnosis present

## 2018-11-16 DIAGNOSIS — S72002A Fracture of unspecified part of neck of left femur, initial encounter for closed fracture: Secondary | ICD-10-CM | POA: Diagnosis present

## 2018-11-16 LAB — CBC
HEMATOCRIT: 35.6 % — AB (ref 39.0–52.0)
Hemoglobin: 11.1 g/dL — ABNORMAL LOW (ref 13.0–17.0)
MCH: 25.3 pg — AB (ref 26.0–34.0)
MCHC: 31.2 g/dL (ref 30.0–36.0)
MCV: 81.1 fL (ref 80.0–100.0)
NRBC: 0 % (ref 0.0–0.2)
PLATELETS: 294 10*3/uL (ref 150–400)
RBC: 4.39 MIL/uL (ref 4.22–5.81)
RDW: 15.9 % — ABNORMAL HIGH (ref 11.5–15.5)
WBC: 8 10*3/uL (ref 4.0–10.5)

## 2018-11-16 LAB — COMPREHENSIVE METABOLIC PANEL
ALT: 22 U/L (ref 0–44)
AST: 23 U/L (ref 15–41)
Albumin: 3.3 g/dL — ABNORMAL LOW (ref 3.5–5.0)
Alkaline Phosphatase: 83 U/L (ref 38–126)
Anion gap: 5 (ref 5–15)
BILIRUBIN TOTAL: 0.8 mg/dL (ref 0.3–1.2)
BUN: 20 mg/dL (ref 8–23)
CALCIUM: 8.1 mg/dL — AB (ref 8.9–10.3)
CHLORIDE: 102 mmol/L (ref 98–111)
CO2: 29 mmol/L (ref 22–32)
CREATININE: 1.1 mg/dL (ref 0.61–1.24)
GFR, EST NON AFRICAN AMERICAN: 58 mL/min — AB (ref >60)
Glucose, Bld: 114 mg/dL — ABNORMAL HIGH (ref 70–99)
Potassium: 4.2 mmol/L (ref 3.5–5.1)
Sodium: 136 mmol/L (ref 135–145)
TOTAL PROTEIN: 6.6 g/dL (ref 6.5–8.1)

## 2018-11-16 MED ORDER — AMIODARONE HCL 200 MG PO TABS
100.0000 mg | ORAL_TABLET | Freq: Every day | ORAL | Status: DC
  Administered 2018-11-17 – 2018-11-20 (×4): 100 mg via ORAL
  Filled 2018-11-16 (×4): qty 1

## 2018-11-16 MED ORDER — ONDANSETRON HCL 4 MG PO TABS: 4.0000 mg | ORAL_TABLET | Freq: Four times a day (QID) | ORAL | Status: DC | PRN

## 2018-11-16 MED ORDER — POLYETHYLENE GLYCOL 3350 17 G PO PACK
17.0000 g | PACK | Freq: Every day | ORAL | Status: DC
  Filled 2018-11-16: qty 1

## 2018-11-16 MED ORDER — ACETAMINOPHEN 325 MG PO TABS
650.0000 mg | ORAL_TABLET | Freq: Four times a day (QID) | ORAL | Status: DC | PRN
  Administered 2018-11-18: 650 mg via ORAL
  Filled 2018-11-16: qty 2

## 2018-11-16 MED ORDER — AZELASTINE HCL 0.1 % NA SOLN
2.0000 | Freq: Two times a day (BID) | NASAL | Status: DC
  Administered 2018-11-16 – 2018-11-19 (×4): 2 via NASAL
  Filled 2018-11-16: qty 30

## 2018-11-16 MED ORDER — ONDANSETRON HCL 4 MG/2ML IJ SOLN: 4.0000 mg | Freq: Four times a day (QID) | INTRAMUSCULAR | Status: DC | PRN

## 2018-11-16 MED ORDER — VITAMIN B-12 1000 MCG PO TABS
5000.0000 ug | ORAL_TABLET | Freq: Every day | ORAL | Status: DC
  Administered 2018-11-17 – 2018-11-20 (×3): 5000 ug via ORAL
  Filled 2018-11-16 (×3): qty 5

## 2018-11-16 MED ORDER — POLYETHYLENE GLYCOL 3350 17 G PO PACK: 17.0000 g | PACK | Freq: Every day | ORAL | Status: DC | PRN

## 2018-11-16 MED ORDER — FUROSEMIDE 20 MG PO TABS
20.0000 mg | ORAL_TABLET | Freq: Every day | ORAL | Status: DC
  Administered 2018-11-17: 20 mg via ORAL
  Filled 2018-11-16 (×2): qty 1

## 2018-11-16 MED ORDER — MORPHINE SULFATE (PF) 4 MG/ML IV SOLN
INTRAVENOUS | Status: AC
  Filled 2018-11-16: qty 1

## 2018-11-16 MED ORDER — KETOROLAC TROMETHAMINE 15 MG/ML IJ SOLN
15.0000 mg | Freq: Four times a day (QID) | INTRAMUSCULAR | Status: DC | PRN
  Administered 2018-11-16: 15 mg via INTRAVENOUS
  Filled 2018-11-16: qty 1

## 2018-11-16 MED ORDER — MORPHINE SULFATE (PF) 4 MG/ML IV SOLN
4.0000 mg | Freq: Once | INTRAVENOUS | Status: AC
  Administered 2018-11-16: 4 mg via INTRAVENOUS
  Filled 2018-11-16: qty 1

## 2018-11-16 MED ORDER — VITAMIN B-12 1000 MCG PO TABS
1000.0000 ug | ORAL_TABLET | Freq: Every day | ORAL | Status: DC
  Filled 2018-11-16: qty 1

## 2018-11-16 MED ORDER — CALCIUM CARBONATE ANTACID 500 MG PO CHEW
2.0000 | CHEWABLE_TABLET | Freq: Three times a day (TID) | ORAL | Status: DC | PRN
  Administered 2018-11-16: 400 mg via ORAL
  Filled 2018-11-16: qty 2

## 2018-11-16 MED ORDER — ACETAMINOPHEN 650 MG RE SUPP: 650.0000 mg | Freq: Four times a day (QID) | RECTAL | Status: DC | PRN

## 2018-11-16 MED ORDER — DOCUSATE SODIUM 100 MG PO CAPS
100.0000 mg | ORAL_CAPSULE | Freq: Two times a day (BID) | ORAL | Status: DC
  Administered 2018-11-16 – 2018-11-17 (×3): 100 mg via ORAL
  Filled 2018-11-16 (×3): qty 1

## 2018-11-16 MED ORDER — ONDANSETRON HCL 4 MG/2ML IJ SOLN
4.0000 mg | Freq: Once | INTRAMUSCULAR | Status: AC
  Administered 2018-11-16: 4 mg via INTRAVENOUS
  Filled 2018-11-16: qty 2

## 2018-11-16 MED ORDER — LEVOTHYROXINE SODIUM 50 MCG PO TABS
50.0000 ug | ORAL_TABLET | Freq: Every day | ORAL | Status: DC
  Administered 2018-11-17 – 2018-11-20 (×4): 50 ug via ORAL
  Filled 2018-11-16 (×4): qty 1

## 2018-11-16 MED ORDER — ATORVASTATIN CALCIUM 20 MG PO TABS
20.0000 mg | ORAL_TABLET | Freq: Every day | ORAL | Status: DC
  Administered 2018-11-17 – 2018-11-20 (×3): 20 mg via ORAL
  Filled 2018-11-16 (×3): qty 1

## 2018-11-16 MED ORDER — MORPHINE SULFATE (PF) 4 MG/ML IV SOLN
4.0000 mg | Freq: Once | INTRAVENOUS | Status: AC
  Administered 2018-11-16: 4 mg via INTRAVENOUS

## 2018-11-16 MED ORDER — TRAMADOL HCL 50 MG PO TABS
50.0000 mg | ORAL_TABLET | Freq: Four times a day (QID) | ORAL | Status: DC | PRN
  Administered 2018-11-16 – 2018-11-17 (×2): 50 mg via ORAL
  Filled 2018-11-16 (×2): qty 1

## 2018-11-16 NOTE — ED Provider Notes (Signed)
[facility] Emergency Department Provider Note  Time seen: 2:38 PM  I have reviewed the triage vital signs and the nursing notes.   HISTORY  Chief Complaint Hip Pain   HPI Matthew Hicks. is a 85 y.o. male with a past medical history of A. fib on Xarelto, CVA, hypertension, hyperlipidemia, presents to the emergency department after a fall.  Per EMS patient fell outside, complaining of left hip pain unable to stand due to hip pain.  Denies any other injuries.  Small skin tear to left hand.  Here patient is awake alert, denies any head injury.  Denies any loss of consciousness.  Describes his pain as moderate, severe with any attempted movement of the left hip.   Past Medical History:  Diagnosis Date  . A-fib ([place]) 04/08/2014  . Aortic stenosis   . Cardiomyopathy ([place])   . Cataract   . CVA (cerebral infarction) 2003  . Dysrhythmia   . GERD (gastroesophageal reflux disease)   . Hearing loss   . Hyperlipidemia   . Hypertension 05/04/2014  . Lung mass   . Macular degeneration   . Nephrolithiasis   . Pernicious anemia   . Pneumonia 05/2015  . Prostate cancer [facility]) 2007   performed by Dr. Eliberto Ivory  . Skin cancer   . Spinal stenosis   . Stroke [facility])     Patient Active Problem List   Diagnosis Date Noted  . Adrenal nodule ([place]) 07/15/2017  . Kidney stone on left side 05/23/2017  . Benign paroxysmal positional vertigo due to bilateral vestibular disorder 01/07/2017  . Pure hypercholesterolemia 05/13/2016  . History of stroke 11/14/2015  . Cerebral vascular accident ([place]) 07/02/2014  . BP (high blood pressure) 05/04/2014  . A-fib ([place]) 04/08/2014  . Benign hypertension 04/08/2014  . Aortic heart valve narrowing 04/08/2014  . Cardiomyopathy ([place]) 04/08/2014  . Acid reflux 04/08/2014  . HLD (hyperlipidemia) 04/08/2014  . PA (pernicious anemia) 04/08/2014  . Spinal stenosis 04/08/2014    Past Surgical History:  Procedure Laterality Date  . CATARACT  EXTRACTION    . CIRCUMCISION  1994  . CYSTOSCOPY  1946  . CYSTOSCOPY N/A 08/10/2018   Procedure: CYSTOSCOPY, superpubic catheter placement;  Surgeon: Royston Cowper, MD;  Location: [facility] ORS;  Service: Urology;  Laterality: N/A;  . CYSTOSCOPY W/ URETERAL STENT REMOVAL Left 06/15/2017   Procedure: CYSTOSCOPY WITH STENT REMOVAL, RETROGRADE, AND HOLMIUM LASER;  Surgeon: Royston Cowper, MD;  Location: [facility] ORS;  Service: Urology;  Laterality: Left;  . CYSTOSCOPY WITH STENT PLACEMENT Left 05/23/2017   Procedure: CYSTOSCOPY WITH STENT PLACEMENT;  Surgeon: Heron Sabins, MD;  Location: [facility] ORS;  Service: Urology;  Laterality: Left;  . laparoscopic prostectomy    . Spinal cyst removal    . TRANSURETHRAL RESECTION OF PROSTATE  2007   prostate cancer    Prior to Admission medications   Medication Sig Start Date End Date Taking? Authorizing Provider  acetaminophen-codeine (TYLENOL #3) 300-30 MG tablet Take 1-2 tablets by mouth every 4 (four) hours as needed for moderate pain. 06/15/17   Royston Cowper, MD  acetaminophen-codeine (TYLENOL #3) 300-30 MG tablet Take 1-2 tablets by mouth every 4 (four) hours as needed for moderate pain. 08/10/18   Royston Cowper, MD  amiodarone (PACERONE) 100 MG tablet Take 100 mg by mouth daily.    [provider]  atorvastatin (LIPITOR) 20 MG tablet Take 20 mg by mouth daily.    [provider]  azelastine (ASTELIN) 0.1 %  nasal spray Place 2 sprays into the nose daily as needed for rhinitis or allergies.  11/14/15 06/07/17  [provider]  Cyanocobalamin (B-12) 5000 MCG CAPS Take 5,000 mcg by mouth daily.     [provider]  furosemide (LASIX) 20 MG tablet Take 20 mg by mouth daily.  09/11/15   [provider]  Multiple Vitamins-Minerals (ICAPS) CAPS Take 1 tablet by mouth daily.    [provider]  ranitidine (ZANTAC) 300 MG capsule Take 300 mg by mouth daily.  05/13/16 05/23/18  [provider]   rivaroxaban (XARELTO) 10 MG TABS tablet TAKE 1 TABLET(10 MG TOTAL) BY MOUTH DAILY WITH DINNER. 08/26/15   [provider]    Allergies  Allergen Reactions  . Amoxicillin-Pot Clavulanate Swelling    Angioedema Has patient had a PCN reaction causing immediate rash, facial/tongue/throat swelling, SOB or lightheadedness with hypotension: Yes Has patient had a PCN reaction causing severe rash involving mucus membranes or skin necrosis: No Has patient had a PCN reaction that required hospitalization: No Has patient had a PCN reaction occurring within the last 10 years: Yes If all of the above answers are "NO", then may proceed with Cephalosporin use.   [place] Kitchen Fesoterodine Fumarate Er Hives and Rash  . Sulfa Antibiotics Rash    Family History  Problem Relation Age of Onset  . Stroke Father        heart disease  . Pneumonia Father   . Alzheimer's disease Brother   . Colon cancer Daughter 54  . Cancer - Other Paternal Aunt 22       "GYN cancer"  . Cancer - Other Paternal Grandmother 69       "GYN cancer"    Social History Social History   Tobacco Use  . Smoking status: Former Smoker    Packs/day: 1.00    Years: 40.00    Pack years: 40.00    Types: Cigarettes    Last attempt to quit: 06/08/1977    Years since quitting: 41.4  . Smokeless tobacco: Never Used  . Tobacco comment: does not smoke now  Substance Use Topics  . Alcohol use: No    Alcohol/week: 0.0 standard drinks  . Drug use: No    Review of Systems Constitutional: Negative for head injury or LOC. Cardiovascular: Negative for chest pain. Respiratory: Negative for shortness of breath. Gastrointestinal: Negative for abdominal pain, vomiting  Musculoskeletal: Positive for left hip pain Skin: Negative for skin complaints  Neurological: Negative for headache All other ROS negative  ____________________________________________   PHYSICAL EXAM:  Constitutional: Alert and oriented. Well appearing and in no  distress. Eyes: Normal exam ENT   Head: Normocephalic and atraumatic.   Mouth/Throat: Mucous membranes are moist. Cardiovascular: Normal rate, regular rhythm. No murmur Respiratory: Normal respiratory effort without tachypnea nor retractions. Breath sounds are clear Gastrointestinal: Soft and nontender. No distention.   Musculoskeletal: Moderate left hip tenderness to palpation, significant pain with attempted range of motion at the left hip.  Neurovascular intact distally.  Warm lower extremity unable to palpate a pulse but strong pulse on Doppler. Neurologic:  Normal speech and language. No gross focal neurologic deficits  Skin:  Skin is warm, dry and intact.  Psychiatric: Mood and affect are normal.   ____________________________________________    EKG  EKG shows A. fib at 54 bpm, 1 QRS, normal axis, nonspecific ST changes.  ____________________________________________    RADIOLOGY  X-ray consistent with intertrochanteric fracture CT scan head negative  ____________________________________________   INITIAL  IMPRESSION / ASSESSMENT AND PLAN / ED COURSE  Pertinent labs & imaging results that were available during my care of the patient were reviewed by me and considered in my medical decision making (see chart for details).  Patient presents to the emergency department after a fall landing on his left hip.  Patient is on Xarelto for chronic A. fib.  Denies head injury however given a fall on Xarelto we will obtain a CT head to rule out ICH.  Patient does have significant tenderness to the left hip palpation as well as any attempted movement of the left hip.  Neurovascular intact distally with positive pulses on Doppler.  We will obtain x-ray imaging of the hip, will also obtain basic labs EKG and chest x-ray for preop for likely left hip fracture.  We will dose morphine and Zofran for pain control/nausea control.  X-ray consistent with left intertrochanteric fracture.  We  will discuss with orthopedics and admit to the hospitalist service.  ____________________________________________   FINAL CLINICAL IMPRESSION(S) / ED DIAGNOSES  Fall Left hip fracture   Harvest Dark, MD 11/16/18 [phone]

## 2018-11-16 NOTE — ED Notes (Signed)
Patient transported to CT 

## 2018-11-16 NOTE — ED Notes (Signed)
Patient transported to X-ray 

## 2018-11-16 NOTE — Progress Notes (Signed)
   11/16/18 2000  Clinical Encounter Type  Visited With Family;Patient and family together  Visit Type Initial;Follow-up;Spiritual support  Referral From Nurse  Consult/Referral To Chaplain  Spiritual Encounters  Spiritual Needs Grief support  Stress Factors  Patient Stress Factors Exhausted;Health changes;Loss  Family Stress Factors Exhausted;Loss  Advance Directives (For Healthcare)  Does Patient Have a Medical Advance Directive? No  Would patient like information on creating a medical advance directive? Yes (Inpatient - patient requests chaplain consult to create a medical advance directive)  [employer] Directives  Does Patient Have a Mental Health Advance Directive? No  Would patient like information on creating a mental health advance directive? No - Patient declined

## 2018-11-16 NOTE — Consult Note (Signed)
ORTHOPAEDIC CONSULTATION  PATIENT NAME: Matthew Hicks. DOB: [birth year]  MRN: [id number]  REQUESTING PHYSICIAN: Nicholes Mango, MD  Chief Complaint: Left hip pain  HPI: Matthew Hicks. is a 85 y.o. male who complains of left hip pain.  He apparently lost his balance while getting out of the car and fell, landing on his left hip and side.  He was unable to stand or bear weight due to the left hip pain.  He denied any loss of consciousness.  He denied any other injuries other than abrasions to the left hand and elbow.  Prior to this injury he was ambulating with pain.  Past Medical History:  Diagnosis Date  . A-fib ([place]) 04/08/2014  . Aortic stenosis   . Cardiomyopathy ([place])   . Cataract   . CVA (cerebral infarction) 2003  . Dysrhythmia   . GERD (gastroesophageal reflux disease)   . Hearing loss   . Hyperlipidemia   . Hypertension 05/04/2014  . Lung mass   . Macular degeneration   . Nephrolithiasis   . Pernicious anemia   . Pneumonia 05/2015  . Prostate cancer [facility]) 2007   performed by Dr. Eliberto Ivory  . Skin cancer   . Spinal stenosis   . Stroke [facility])    Past Surgical History:  Procedure Laterality Date  . CATARACT EXTRACTION    . CIRCUMCISION  1994  . CYSTOSCOPY  1946  . CYSTOSCOPY N/A 08/10/2018   Procedure: CYSTOSCOPY, superpubic catheter placement;  Surgeon: Royston Cowper, MD;  Location: [facility] ORS;  Service: Urology;  Laterality: N/A;  . CYSTOSCOPY W/ URETERAL STENT REMOVAL Left 06/15/2017   Procedure: CYSTOSCOPY WITH STENT REMOVAL, RETROGRADE, AND HOLMIUM LASER;  Surgeon: Royston Cowper, MD;  Location: [facility] ORS;  Service: Urology;  Laterality: Left;  . CYSTOSCOPY WITH STENT PLACEMENT Left 05/23/2017   Procedure: CYSTOSCOPY WITH STENT PLACEMENT;  Surgeon: Heron Sabins, MD;  Location: [facility] ORS;  Service: Urology;  Laterality: Left;  . laparoscopic prostectomy    . Spinal cyst removal    . TRANSURETHRAL RESECTION OF PROSTATE  2007   prostate cancer   Social History    Socioeconomic History  . Marital status: Married    Spouse name: Not on file  . Number of children: Not on file  . Years of education: Not on file  . Highest education level: Not on file  Occupational History  . Not on file  Social Needs  . Financial resource strain: Not on file  . Food insecurity:    Worry: Not on file    Inability: Not on file  . Transportation needs:    Medical: Not on file    Non-medical: Not on file  Tobacco Use  . Smoking status: Former Smoker    Packs/day: 1.00    Years: 40.00    Pack years: 40.00    Types: Cigarettes    Last attempt to quit: 06/08/1977    Years since quitting: 41.4  . Smokeless tobacco: Never Used  . Tobacco comment: does not smoke now  Substance and Sexual Activity  . Alcohol use: No    Alcohol/week: 0.0 standard drinks  . Drug use: No  . Sexual activity: Not Currently  Lifestyle  . Physical activity:    Days per week: Not on file    Minutes per session: Not on file  . Stress: Not on file  Relationships  . Social connections:    Talks on phone: Not on file    Gets together: Not  on file    Attends religious service: Not on file    Active member of club or organization: Not on file    Attends meetings of clubs or organizations: Not on file    Relationship status: Not on file  Other Topics Concern  . Not on file  Social History Narrative  . Not on file   Family History  Problem Relation Age of Onset  . Stroke Father        heart disease  . Pneumonia Father   . Alzheimer's disease Brother   . Colon cancer Daughter 39  . Cancer - Other Paternal Aunt 56       "GYN cancer"  . Cancer - Other Paternal Grandmother 67       "GYN cancer"   Allergies  Allergen Reactions  . Amoxicillin-Pot Clavulanate Swelling    Angioedema Has patient had a PCN reaction causing immediate rash, facial/tongue/throat swelling, SOB or lightheadedness with hypotension: Yes Has patient had a PCN reaction causing severe rash involving mucus  membranes or skin necrosis: No Has patient had a PCN reaction that required hospitalization: No Has patient had a PCN reaction occurring within the last 10 years: Yes If all of the above answers are "NO", then may proceed with Cephalosporin use.   Marland Kitchen Fesoterodine Fumarate Er Hives and Rash  . Sulfa Antibiotics Rash   Prior to Admission medications   Medication Sig Start Date End Date Taking? Authorizing Provider  amiodarone (PACERONE) 100 MG tablet Take 100 mg by mouth daily.   Yes [provider]  atorvastatin (LIPITOR) 20 MG tablet Take 20 mg by mouth daily.   Yes [provider]  Cyanocobalamin (B-12) 5000 MCG CAPS Take 5,000 mcg by mouth daily.    Yes [provider]  furosemide (LASIX) 20 MG tablet Take 20 mg by mouth daily.  09/11/15  Yes [provider]  levothyroxine (SYNTHROID, LEVOTHROID) 50 MCG tablet Take 50 mcg by mouth daily. 10/03/18 10/03/19 Yes [provider]  Multiple Vitamins-Minerals (ICAPS) CAPS Take 1 tablet by mouth daily.   Yes [provider]  ranitidine (ZANTAC) 300 MG capsule Take 300 mg by mouth daily.  05/13/16 11/16/18 Yes [provider]  rivaroxaban (XARELTO) 10 MG TABS tablet TAKE 1 TABLET(10 MG TOTAL) BY MOUTH DAILY WITH DINNER. 08/26/15  Yes [provider]  vitamin B-12 (CYANOCOBALAMIN) 1000 MCG tablet Take 1,000 mcg by mouth daily.   Yes [provider]  acetaminophen-codeine (TYLENOL #3) 300-30 MG tablet Take 1-2 tablets by mouth every 4 (four) hours as needed for moderate pain. Patient not taking: Reported on 11/16/2018 06/15/17   Royston Cowper, MD  acetaminophen-codeine (TYLENOL #3) 300-30 MG tablet Take 1-2 tablets by mouth every 4 (four) hours as needed for moderate pain. Patient not taking: Reported on 11/16/2018 08/10/18   Royston Cowper, MD  azelastine (ASTELIN) 0.1 % nasal spray Place 2 sprays into the nose daily as needed for rhinitis or allergies.  11/14/15 06/07/17   [provider]  docusate sodium (COLACE) 100 MG capsule Take 100 mg by mouth 2 (two) times daily.    [provider]  Polyethylene Glycol 3350 (PEG 3350) POWD Take 17 g by mouth daily.    [provider]   Dg Chest 1 View  Result Date: 11/16/2018 CLINICAL DATA:  [place] [address] on his left side getting out of a car today. Left hip fracture. Preoperative evaluation. EXAM: CHEST  1 VIEW COMPARISON:  Chest CT dated 08/09/2017. FINDINGS: Grossly  stable enlarged cardiac silhouette and right basilar pleural fluid, pleural thickening and atelectasis. Stable mild prominence of the interstitial markings with normal vascularity. Diffuse osteopenia and thoracic spine degenerative changes. Cervical spine degenerative changes. No fracture or pneumothorax seen. Bilateral carotid artery calcifications are noted in neck. IMPRESSION: 1. No acute findings. 2. Stable cardiomegaly, chronic interstitial lung disease and right pleural fluid and atelectasis. 3. Bilateral carotid artery atheromatous calcifications. Electronically Signed   By: Claudie Revering M.D.   On: 11/16/2018 15:19   Ct Head Wo Contrast  Result Date: 11/16/2018 CLINICAL DATA:  85 year old male status post fall with pain. EXAM: CT HEAD WITHOUT CONTRAST TECHNIQUE: Contiguous axial images were obtained from the base of the skull through the vertex without intravenous contrast. COMPARISON:  Head CT 10/07/2004. FINDINGS: Brain: Chronic cerebral white matter hypodensity has progressed since 2005, confluent in both hemispheres. Generalized cerebral volume decreased since that time. No ventriculomegaly. No midline shift, mass effect, or evidence of intracranial mass lesion. No acute intracranial hemorrhage identified. No cortically based acute infarct identified. No cortical encephalomalacia identified. The heterogeneity of the bilateral basal ganglia is new since 2005. Vascular: Calcified atherosclerosis at the skull base. No suspicious intracranial  vascular hyperdensity. Skull: Stable and intact. Osteopenia. Sinuses/Orbits: Visualized paranasal sinuses and mastoids are stable and well pneumatized. Other: Postoperative changes to both globes. No scalp hematoma or acute orbit or scalp soft tissue finding identified. IMPRESSION: 1. No acute intracranial abnormality or acute traumatic injury identified. 2. Progressed chronic small vessel disease suspected since 2005. Electronically Signed   By: Genevie Ann M.D.   On: 11/16/2018 15:49   Dg Hip Unilat With Pelvis 2-3 Views Left  Result Date: 11/16/2018 CLINICAL DATA:  Left hip pain following a fall getting out of a car today. EXAM: DG HIP (WITH OR WITHOUT PELVIS) 2-3V LEFT COMPARISON:  None. FINDINGS: Comminuted left intertrochanteric fracture with varus angulation. No significant displacement of the major fragments. Diffuse osteopenia. Prostate radiation seed implants. Suprapubic Foley catheter. Lower lumbar spine degenerative changes and scoliosis. IMPRESSION: Comminuted left intertrochanteric fracture with varus angulation. Electronically Signed   By: Claudie Revering M.D.   On: 11/16/2018 15:20    Positive ROS: All other systems have been reviewed and were otherwise negative with the exception of those mentioned in the HPI and as above.  Physical Exam: General: Well developed, well nourished male seen in no acute distress. HEENT: Atraumatic and normocephalic. Sclera are clear. Extraocular motion is intact. Oropharynx is clear with moist mucosa. Neck: Supple, nontender, good range of motion. No JVD or carotid bruits. Lungs: Clear to auscultation bilaterally. Cardiovascular: Irregular rate and rhythm with normal S1 and S2. 3/6 murmur. No gallops or rubs. Pedal pulses are palpable bilaterally. Homans test is negative bilaterally. No significant pretibial or ankle edema. Abdomen: Soft, nontender, and nondistended. Bowel sounds are present. Skin: No lesions in the area of chief complaint.  Some trophic skin  changes to the lower legs and feet. Neurologic: Awake, alert, and oriented. Sensory function is grossly intact. Motor strength is felt to be 5 over 5 bilaterally. No clonus or tremor. Good motor coordination. Lymphatic: No axillary or cervical lymphadenopathy  MUSCULOSKELETAL: Examination of the left lower extremity shows the extremity to be shortened and externally rotated.  Pain is elicited with any attempted range of motion of the left hip.  No appreciable ecchymosis or erythema about the hip.  Examination of the left knee shows no effusion, erythema, or tenderness to palpation.  No tenderness to palpation about the  left ankle.  Assessment: Left intertrochanteric femur fracture  Plan: The findings were discussed in detail with the patient.  Recommendation was made for open reduction and internal fixation of the left intertrochanteric femur fracture.  The usual perioperative course was discussed. The risks and benefits of surgical intervention were reviewed. The patient expressed understanding of the risks and benefits and agreed with plans for surgical intervention.   The surgical site was signed as per the "right site surgery" protocol.   The patient typically takes Xarelto every evening due to his atrial fibrillation.  Anesthesiology requires 3 days from the discontinuation of Xarelto before neuraxial anesthesia can be administered.  I will confirm potential plans for surgery on Friday.   P. Holley Bouche M.D.

## 2018-11-16 NOTE — ED Triage Notes (Signed)
Pt had a mechanical fall and c/o left hip pain. Pt has had 82mcg fentanyl. No rotation or shortening with EMS. C/o 10/10 pain. CBG 137 with EMS.

## 2018-11-16 NOTE — ED Notes (Signed)
Wounds from fall on elbow and left hand wrapped in xeroform and gauze at this time.

## 2018-11-16 NOTE — Progress Notes (Signed)
Family Meeting Note  Advance Directive:yes  Today a meeting took place with the Patient.,  2 sons and daughter at bedside    The following clinical team members were present during this meeting:MD  The following were discussed:Patient's diagnosis:Acute left hip pain, left intertrochanteric fracture, history of GERD, chronic atrial fibrillation on Xarelto last dose was yesterday night, hyperlipidemia, hypothyroidism, hypertension and cardiomyopathy and treatment plan of care discussed in detail with the patient and his children at bedside.  They all verbalized understanding of the plan   , Patient's progosis: Unable to determine and Goals for treatment: Full Code, eldest son Matthew Hicks is the healthcare POA  Additional follow-up to be provided: Hospitalist, orthopedics Dr. Marry Guan  Time spent during discussion:17 min  Matthew Mango, MD

## 2018-11-16 NOTE — H&P (Signed)
[place] [facility] [place] NAME: Matthew Hicks    MR#:  [id number]  DATE OF BIRTH:  21-Sep-1925  DATE OF ADMISSION:  11/16/2018  PRIMARY CARE PHYSICIAN: Kirk Ruths, MD   REQUESTING/REFERRING PHYSICIAN: Harvest Dark, MD  CHIEF COMPLAINT:  Hip pain  HISTORY OF PRESENT ILLNESS:  Matthew Hicks  is a 85 y.o. male with a known history of chronic atrial fibrillation on Xarelto, amiodarone, hypertension, hyperlipidemia, history of stroke in the past is presenting to the ED with a chief complaint of left hip pain after he sustained a fall.  X-rays revealed comminuted left intertrochanteric fracture.  Call placed to orthopedics Dr. Dr. Marry Guan on call and hospitalist team is called to admit the patient.  Patient's 2 sons and daughter at bedside.  Patient denies any complaints  PAST MEDICAL HISTORY:   Past Medical History:  Diagnosis Date  . A-fib ([place]) 04/08/2014  . Aortic stenosis   . Cardiomyopathy ([place])   . Cataract   . CVA (cerebral infarction) 2003  . Dysrhythmia   . GERD (gastroesophageal reflux disease)   . Hearing loss   . Hyperlipidemia   . Hypertension 05/04/2014  . Lung mass   . Macular degeneration   . Nephrolithiasis   . Pernicious anemia   . Pneumonia 05/2015  . Prostate cancer [facility]) 2007   performed by Dr. Eliberto Ivory  . Skin cancer   . Spinal stenosis   . Stroke [facility])     PAST SURGICAL HISTOIRY:   Past Surgical History:  Procedure Laterality Date  . CATARACT EXTRACTION    . CIRCUMCISION  1994  . CYSTOSCOPY  1946  . CYSTOSCOPY N/A 08/10/2018   Procedure: CYSTOSCOPY, superpubic catheter placement;  Surgeon: Royston Cowper, MD;  Location: [facility] ORS;  Service: Urology;  Laterality: N/A;  . CYSTOSCOPY W/ URETERAL STENT REMOVAL Left 06/15/2017   Procedure: CYSTOSCOPY WITH STENT REMOVAL, RETROGRADE, AND HOLMIUM LASER;  Surgeon: Royston Cowper, MD;  Location: [facility] ORS;  Service: Urology;  Laterality: Left;  . CYSTOSCOPY  WITH STENT PLACEMENT Left 05/23/2017   Procedure: CYSTOSCOPY WITH STENT PLACEMENT;  Surgeon: Heron Sabins, MD;  Location: [facility] ORS;  Service: Urology;  Laterality: Left;  . laparoscopic prostectomy    . Spinal cyst removal    . TRANSURETHRAL RESECTION OF PROSTATE  2007   prostate cancer    SOCIAL HISTORY:   Social History   Tobacco Use  . Smoking status: Former Smoker    Packs/day: 1.00    Years: 40.00    Pack years: 40.00    Types: Cigarettes    Last attempt to quit: 06/08/1977    Years since quitting: 41.4  . Smokeless tobacco: Never Used  . Tobacco comment: does not smoke now  Substance Use Topics  . Alcohol use: No    Alcohol/week: 0.0 standard drinks    FAMILY HISTORY:   Family History  Problem Relation Age of Onset  . Stroke Father        heart disease  . Pneumonia Father   . Alzheimer's disease Brother   . Colon cancer Daughter 89  . Cancer - Other Paternal Aunt 57       "GYN cancer"  . Cancer - Other Paternal Grandmother 24       "GYN cancer"    DRUG ALLERGIES:   Allergies  Allergen Reactions  . Amoxicillin-Pot Clavulanate Swelling    Angioedema Has patient had a PCN reaction causing immediate rash, facial/tongue/throat swelling, SOB  or lightheadedness with hypotension: Yes Has patient had a PCN reaction causing severe rash involving mucus membranes or skin necrosis: No Has patient had a PCN reaction that required hospitalization: No Has patient had a PCN reaction occurring within the last 10 years: Yes If all of the above answers are "NO", then may proceed with Cephalosporin use.   Marland Kitchen Fesoterodine Fumarate Er Hives and Rash  . Sulfa Antibiotics Rash    REVIEW OF SYSTEMS:  CONSTITUTIONAL: No fever, fatigue or weakness.  EYES: No blurred or double vision.  EARS, NOSE, AND THROAT: No tinnitus or ear pain.  RESPIRATORY: No cough, shortness of breath, wheezing or hemoptysis.  CARDIOVASCULAR: No chest pain, orthopnea, edema.  GASTROINTESTINAL: No  nausea, vomiting, diarrhea or abdominal pain.  GENITOURINARY: No dysuria, hematuria.  ENDOCRINE: No polyuria, nocturia,  HEMATOLOGY: No anemia, easy bruising or bleeding SKIN: Left elbow and left hand laceration MUSCULOSKELETAL: Left hip pain and left hand pain NEUROLOGIC: No tingling, numbness, weakness.  PSYCHIATRY: No anxiety or depression.   MEDICATIONS AT HOME:   Prior to Admission medications   Medication Sig Start Date End Date Taking? Authorizing Provider  amiodarone (PACERONE) 100 MG tablet Take 100 mg by mouth daily.   Yes [provider]  atorvastatin (LIPITOR) 20 MG tablet Take 20 mg by mouth daily.   Yes [provider]  furosemide (LASIX) 20 MG tablet Take 20 mg by mouth daily.  09/11/15  Yes [provider]  levothyroxine (SYNTHROID, LEVOTHROID) 50 MCG tablet Take 50 mcg by mouth daily. 10/03/18 10/03/19 Yes [provider]  Multiple Vitamins-Minerals (ICAPS) CAPS Take 1 tablet by mouth daily.   Yes [provider]  rivaroxaban (XARELTO) 10 MG TABS tablet TAKE 1 TABLET(10 MG TOTAL) BY MOUTH DAILY WITH DINNER. 08/26/15  Yes [provider]  acetaminophen-codeine (TYLENOL #3) 300-30 MG tablet Take 1-2 tablets by mouth every 4 (four) hours as needed for moderate pain. 06/15/17   Royston Cowper, MD  acetaminophen-codeine (TYLENOL #3) 300-30 MG tablet Take 1-2 tablets by mouth every 4 (four) hours as needed for moderate pain. 08/10/18   Royston Cowper, MD  azelastine (ASTELIN) 0.1 % nasal spray Place 2 sprays into the nose daily as needed for rhinitis or allergies.  11/14/15 06/07/17  [provider]  Cyanocobalamin (B-12) 5000 MCG CAPS Take 5,000 mcg by mouth daily.     [provider]  docusate sodium (COLACE) 100 MG capsule Take 100 mg by mouth 2 (two) times daily.    [provider]  Polyethylene Glycol 3350 (PEG 3350) POWD Take 17 g by mouth daily.    [provider]  ranitidine (ZANTAC)  300 MG capsule Take 300 mg by mouth daily.  05/13/16 05/23/18  [provider]  vitamin B-12 (CYANOCOBALAMIN) 1000 MCG tablet Take 1,000 mcg by mouth daily.    [provider]      VITAL SIGNS:  Blood pressure (!) 173/84, pulse 64, temperature 97.8 F (36.6 C), temperature source Oral, resp. rate (!) 24, height 5\' 5"  (1.651 m), weight 61 kg, SpO2 93 %.  PHYSICAL EXAMINATION:  GENERAL:  85 y.o.-year-old patient lying in the bed with no acute distress.  EYES: Pupils equal, round, reactive to light and accommodation. No scleral icterus. Extraocular muscles intact.  HEENT: Head atraumatic, normocephalic. Oropharynx and nasopharynx clear.  NECK:  Supple, no jugular venous distention. No thyroid enlargement, no tenderness.  LUNGS: Normal breath sounds bilaterally, no wheezing, rales,rhonchi or crepitation. No use of accessory muscles of  respiration.  CARDIOVASCULAR: Irregularly irregular no murmurs, rubs, or gallops.  ABDOMEN: Soft, nontender, nondistended. Bowel sounds present.  EXTREMITIES: Left hip is externally rotated and abducted no pedal edema, cyanosis, or clubbing.  NEUROLOGIC: Awake alert and oriented x3 Senation intact. Gait not checked.  PSYCHIATRIC: The patient is alert and oriented x 3.  SKIN: No obvious rash, lesion, or ulcer.   LABORATORY PANEL:   CBC Recent Labs  Lab 11/16/18 1531  WBC 8.0  HGB 11.1*  HCT 35.6*  PLT 294   ------------------------------------------------------------------------------------------------------------------  Chemistries  Recent Labs  Lab 11/16/18 1531  NA 136  K 4.2  CL 102  CO2 29  GLUCOSE 114*  BUN 20  CREATININE 1.10  CALCIUM 8.1*  AST 23  ALT 22  ALKPHOS 83  BILITOT 0.8   ------------------------------------------------------------------------------------------------------------------  Cardiac Enzymes No results for input(s): TROPONINI in the last 168  hours. ------------------------------------------------------------------------------------------------------------------  RADIOLOGY:  Dg Chest 1 View  Result Date: 11/16/2018 CLINICAL DATA:  [place] on his left side getting out of a car today. Left hip fracture. Preoperative evaluation. EXAM: CHEST  1 VIEW COMPARISON:  Chest CT dated 08/09/2017. FINDINGS: Grossly stable enlarged cardiac silhouette and right basilar pleural fluid, pleural thickening and atelectasis. Stable mild prominence of the interstitial markings with normal vascularity. Diffuse osteopenia and thoracic spine degenerative changes. Cervical spine degenerative changes. No fracture or pneumothorax seen. Bilateral carotid artery calcifications are noted in neck. IMPRESSION: 1. No acute findings. 2. Stable cardiomegaly, chronic interstitial lung disease and right pleural fluid and atelectasis. 3. Bilateral carotid artery atheromatous calcifications. Electronically Signed   By: Claudie Revering M.D.   On: 11/16/2018 15:19   Ct Head Wo Contrast  Result Date: 11/16/2018 CLINICAL DATA:  85 year old male status post fall with pain. EXAM: CT HEAD WITHOUT CONTRAST TECHNIQUE: Contiguous axial images were obtained from the base of the skull through the vertex without intravenous contrast. COMPARISON:  Head CT 10/07/2004. FINDINGS: Brain: Chronic cerebral white matter hypodensity has progressed since 2005, confluent in both hemispheres. Generalized cerebral volume decreased since that time. No ventriculomegaly. No midline shift, mass effect, or evidence of intracranial mass lesion. No acute intracranial hemorrhage identified. No cortically based acute infarct identified. No cortical encephalomalacia identified. The heterogeneity of the bilateral basal ganglia is new since 2005. Vascular: Calcified atherosclerosis at the skull base. No suspicious intracranial vascular hyperdensity. Skull: Stable and intact. Osteopenia. Sinuses/Orbits: Visualized paranasal  sinuses and mastoids are stable and well pneumatized. Other: Postoperative changes to both globes. No scalp hematoma or acute orbit or scalp soft tissue finding identified. IMPRESSION: 1. No acute intracranial abnormality or acute traumatic injury identified. 2. Progressed chronic small vessel disease suspected since 2005. Electronically Signed   By: Genevie Ann M.D.   On: 11/16/2018 15:49   Dg Hip Unilat With Pelvis 2-3 Views Left  Result Date: 11/16/2018 CLINICAL DATA:  Left hip pain following a fall getting out of a car today. EXAM: DG HIP (WITH OR WITHOUT PELVIS) 2-3V LEFT COMPARISON:  None. FINDINGS: Comminuted left intertrochanteric fracture with varus angulation. No significant displacement of the major fragments. Diffuse osteopenia. Prostate radiation seed implants. Suprapubic Foley catheter. Lower lumbar spine degenerative changes and scoliosis. IMPRESSION: Comminuted left intertrochanteric fracture with varus angulation. Electronically Signed   By: Claudie Revering M.D.   On: 11/16/2018 15:20    EKG:   Orders placed or performed during the hospital encounter of 11/16/18  . ED EKG  . ED EKG    IMPRESSION AND PLAN:    #Acute  left intertrochanteric fracture Admit to Ortho unit Pain management as needed Orthopedics consult placed and text message sent to Dr. Marry Guan on-call orthopedic surgeon and also call placed to call back Patient's last intake of Xarelto was yesterday We will tentatively keep him n.p.o. after midnight  #Chronic atrial fibrillation-rate controlled Hold Xarelto Continue home medication amiodarone  #Hypothyroidism continue Synthyroid   #Hyperlipidemia continue statin  #GERD H2 blocker  DVT prophylaxis with SCDs    All the records are reviewed and case discussed with ED provider. Management plans discussed with the patient, family and they are in agreement.  CODE STATUS: fc   TOTAL TIME TAKING CARE OF THIS PATIENT: 43 minutes.   Note: This dictation was  prepared with Dragon dictation along with smaller phrase technology. Any transcriptional errors that result from this process are unintentional.  Nicholes Mango M.D on 11/16/2018 at 5:10 PM  Between 7am to 6pm - Pager - [phone]  After 6pm go to [email or web address] - password [email or web address]  [facility]  Office  [phone]  CC: Primary care physician; Kirk Ruths, MD

## 2018-11-17 LAB — CBC
HCT: 30.3 % — ABNORMAL LOW (ref 39.0–52.0)
Hemoglobin: 9.4 g/dL — ABNORMAL LOW (ref 13.0–17.0)
MCH: 25 pg — AB (ref 26.0–34.0)
MCHC: 31 g/dL (ref 30.0–36.0)
MCV: 80.6 fL (ref 80.0–100.0)
Platelets: 271 10*3/uL (ref 150–400)
RBC: 3.76 MIL/uL — ABNORMAL LOW (ref 4.22–5.81)
RDW: 16 % — ABNORMAL HIGH (ref 11.5–15.5)
WBC: 8.5 10*3/uL (ref 4.0–10.5)
nRBC: 0 % (ref 0.0–0.2)

## 2018-11-17 LAB — SURGICAL PCR SCREEN
MRSA, PCR: NEGATIVE
Staphylococcus aureus: POSITIVE — AB

## 2018-11-17 LAB — COMPREHENSIVE METABOLIC PANEL
ALBUMIN: 2.9 g/dL — AB (ref 3.5–5.0)
ALT: 20 U/L (ref 0–44)
AST: 20 U/L (ref 15–41)
Alkaline Phosphatase: 70 U/L (ref 38–126)
Anion gap: 7 (ref 5–15)
BUN: 23 mg/dL (ref 8–23)
CHLORIDE: 101 mmol/L (ref 98–111)
CO2: 28 mmol/L (ref 22–32)
Calcium: 8.1 mg/dL — ABNORMAL LOW (ref 8.9–10.3)
Creatinine, Ser: 1.17 mg/dL (ref 0.61–1.24)
GFR calc Af Amer: >60 mL/min (ref >60)
GFR calc non Af Amer: 54 mL/min — ABNORMAL LOW (ref >60)
Glucose, Bld: 121 mg/dL — ABNORMAL HIGH (ref 70–99)
Potassium: 4.2 mmol/L (ref 3.5–5.1)
SODIUM: 136 mmol/L (ref 135–145)
Total Bilirubin: 0.7 mg/dL (ref 0.3–1.2)
Total Protein: 5.7 g/dL — ABNORMAL LOW (ref 6.5–8.1)

## 2018-11-17 LAB — PROTIME-INR
INR: 1.21
Prothrombin Time: 15.2 seconds (ref 11.4–15.2)

## 2018-11-17 MED ORDER — MUPIROCIN 2 % EX OINT
1.0000 "application " | TOPICAL_OINTMENT | Freq: Two times a day (BID) | CUTANEOUS | Status: DC
  Administered 2018-11-17 – 2018-11-18 (×2): 1 via NASAL
  Filled 2018-11-17: qty 22

## 2018-11-17 MED ORDER — FAMOTIDINE 20 MG PO TABS
20.0000 mg | ORAL_TABLET | Freq: Every day | ORAL | Status: DC
  Administered 2018-11-17 – 2018-11-20 (×3): 20 mg via ORAL
  Filled 2018-11-17 (×3): qty 1

## 2018-11-17 MED ORDER — CLINDAMYCIN PHOSPHATE 600 MG/50ML IV SOLN
600.0000 mg | INTRAVENOUS | Status: AC
  Administered 2018-11-18: 600 mg via INTRAVENOUS
  Filled 2018-11-17: qty 50

## 2018-11-17 MED ORDER — CHLORHEXIDINE GLUCONATE CLOTH 2 % EX PADS
6.0000 | MEDICATED_PAD | Freq: Every day | CUTANEOUS | Status: DC
  Administered 2018-11-18: 6 via TOPICAL

## 2018-11-17 MED ORDER — LEVOFLOXACIN IN D5W 500 MG/100ML IV SOLN
500.0000 mg | INTRAVENOUS | Status: AC
  Administered 2018-11-18: 500 mg via INTRAVENOUS
  Filled 2018-11-17: qty 100

## 2018-11-17 NOTE — Clinical Social Work Placement (Signed)
   CLINICAL SOCIAL WORK PLACEMENT  NOTE  Date:  11/17/2018  Patient Details  Name: Matthew Hicks. MRN: [id number] Date of Birth: 1925/01/20  Clinical Social Work is seeking post-discharge placement for this patient at the [place] level of care (*CSW will initial, date and re-position this form in  chart as items are completed):  Yes   Patient/family provided with [place] Work Department's list of facilities offering this level of care within the geographic area requested by the patient (or if unable, by the patient's family).  Yes   Patient/family informed of their freedom to choose among providers that offer the needed level of care, that participate in Medicare, Medicaid or managed care program needed by the patient, have an available bed and are willing to accept the patient.  Yes   Patient/family informed of [place]'s ownership interest in [facility] and [facility], as well as of the fact that they are under no obligation to receive care at these facilities.  PASRR submitted to EDS on 11/17/18     PASRR number received on 11/17/18     Existing PASRR number confirmed on       FL2 transmitted to all facilities in geographic area requested by pt/family on 11/17/18     FL2 transmitted to all facilities within larger geographic area on       Patient informed that his/her managed care company has contracts with or will negotiate with certain facilities, including the following:            Patient/family informed of bed offers received.  Patient chooses bed at       Physician recommends and patient chooses bed at      Patient to be transferred to   on  .  Patient to be transferred to facility by       Patient family notified on   of transfer.  Name of family member notified:        PHYSICIAN       Additional Comment:    _______________________________________________ Desmond Szabo, Veronia Beets, LCSW 11/17/2018, 2:17 PM

## 2018-11-17 NOTE — Clinical Social Work Note (Signed)
Clinical Social Work Assessment  Patient Details  Name: Matthew Hicks. MRN: [id number] Date of Birth: 12-28-1924  Date of referral:  11/17/18               Reason for consult:  Facility Placement                Permission sought to share information with:  Chartered certified accountant granted to share information::  Yes, Verbal Permission Granted  Name::      Matthew Hicks::   [place]   Relationship::     Contact Information:     Housing/Transportation Living arrangements for the past 2 months:  [place] of Information:  Adult Children Patient Interpreter Needed:  None, [place] Criminal Activity/Legal Involvement Pertinent to Current Situation/Hospitalization:    Significant Relationships:  Adult Children, Spouse Lives with:  Spouse Do you feel safe going back to the place where you live?  Yes Need for family participation in patient care:  Yes (Comment)  Care giving concerns:  Patient lives in [place] with his wife Matthew Hicks.    Social Worker assessment / plan:  Holiday representative ([place]) reviewed chart and noted that patient has a hip fracture. Per MD patient will have surgery tomorrow. PT is pending. CSW met with patient and his 2 sons Matthew Hicks and Matthew Hicks were at bedside. Patient was hard of hearing and did not participate in assessment. Per sons patient lives in [place] with his wife Matthew Hicks. Patient has 1 adult daughter Matthew Hicks and 2 adults sons. CSW explained that after surgery PT will evaluate patient and make a recommendation of home health or SNF. CSW also explained that in order for medicare to pay for SNF patient will need a 3 night qualifying inpatient hospital stay. Patient was admitted to inpatient 2/5. Sons verbalized their understanding and are agreeable to SNF search in [place]. FL2 complete and faxed out. CSW will continue to follow and assist as needed.   Employment status:  Disabled (Comment on whether or  not currently receiving Disability), Retired Forensic scientist:  Medicare PT Recommendations:  Not assessed at this time Information / Referral to community resources:  [place]  Patient/Family's Response to care:  Patient's sons are agreeable to SNF search in [place].   Patient/Family's Understanding of and Emotional Response to Diagnosis, Current Treatment, and Prognosis:  Patient's sons were very pleasant and thanked CSW for assistance.   Emotional Assessment Appearance:  Appears stated age Attitude/Demeanor/Rapport:    Affect (typically observed):  Accepting, Adaptable, Pleasant Orientation:  Oriented to Self, Oriented to Place, Oriented to  Time, Oriented to Situation Alcohol / Substance use:  Not Applicable Psych involvement (Current and /or in the community):  No (Comment)  Discharge Needs  Concerns to be addressed:  Discharge Planning Concerns Readmission within the last 30 days:  No Current discharge risk:  Dependent with Mobility Barriers to Discharge:  Continued Medical Work up   [employer], Matthew Beets, LCSW 11/17/2018, 2:18 PM

## 2018-11-17 NOTE — Progress Notes (Signed)
Clinical Education officer, museum (CSW) presented SNF bed offers to patient's sons [name] and [name]. Per sons they will review bed offers and get back to CSW with their decision.   [employer], LCSW [phone]

## 2018-11-17 NOTE — NC FL2 (Signed)
[place] LEVEL OF CARE SCREENING TOOL     IDENTIFICATION  Patient Name: Matthew Hicks. Birthdate: 02/20/1925 Sex: male Admission Date (Current Location): 11/16/2018  [place] and [place] Number:  Engineering geologist and Address:  [facility], [address] [place], [place] [address]      Provider Number: [id number]  Attending Physician Name and Address:  Max Sane, MD  Relative Name and Phone Number:       Current Level of Care: Hospital Recommended Level of Care: [place] Prior Approval Number:    Date Approved/Denied:   PASRR Number: ([id number])  Discharge Plan: SNF    Current Diagnoses: Patient Active Problem List   Diagnosis Date Noted  . Closed left hip fracture, initial encounter ([place]) 11/16/2018  . Adrenal nodule ([place]) 07/15/2017  . Kidney stone on left side 05/23/2017  . Benign paroxysmal positional vertigo due to bilateral vestibular disorder 01/07/2017  . Pure hypercholesterolemia 05/13/2016  . History of stroke 11/14/2015  . Cerebral vascular accident ([place]) 07/02/2014  . BP (high blood pressure) 05/04/2014  . A-fib ([place]) 04/08/2014  . Benign hypertension 04/08/2014  . Aortic heart valve narrowing 04/08/2014  . Cardiomyopathy ([place]) 04/08/2014  . Acid reflux 04/08/2014  . HLD (hyperlipidemia) 04/08/2014  . PA (pernicious anemia) 04/08/2014  . Spinal stenosis 04/08/2014    Orientation RESPIRATION BLADDER Height & Weight     Self, Time, Situation, Place  Normal Continent Weight: 134 lb 7.7 oz (61 kg) Height:  5\' 5"  (165.1 cm)  BEHAVIORAL SYMPTOMS/MOOD NEUROLOGICAL BOWEL NUTRITION STATUS      Continent Diet(Diet: Heart Healthy )  AMBULATORY STATUS COMMUNICATION OF NEEDS Skin   Extensive Assist Verbally Surgical wounds                       Personal Care Assistance Level of Assistance  Bathing, Feeding, Dressing Bathing Assistance: Limited assistance Feeding assistance:  Independent Dressing Assistance: Limited assistance     Functional Limitations Info  Sight, Hearing, Speech Sight Info: Impaired Hearing Info: Impaired Speech Info: Adequate    SPECIAL CARE FACTORS FREQUENCY  PT (By licensed PT), OT (By licensed OT)     PT Frequency: (5) OT Frequency: (5)            Contractures      Additional Factors Info  Code Status, Allergies Code Status Info: (Full Code. ) Allergies Info: (Amoxicillin-pot Clavulanate, Fesoterodine Fumarate Er, Sulfa Antibiotics)           Current Medications (11/17/2018):  This is the current hospital active medication list Current Facility-Administered Medications  Medication Dose Route Frequency Provider Last Rate Last Dose  . acetaminophen (TYLENOL) tablet 650 mg  650 mg Oral Q6H PRN Gouru, Aruna, MD       Or  . acetaminophen (TYLENOL) suppository 650 mg  650 mg Rectal Q6H PRN Gouru, Aruna, MD      . amiodarone (PACERONE) tablet 100 mg  100 mg Oral Daily Gouru, Aruna, MD   100 mg at 11/17/18 0919  . atorvastatin (LIPITOR) tablet 20 mg  20 mg Oral Daily Gouru, Aruna, MD   20 mg at 11/17/18 0920  . azelastine (ASTELIN) 0.1 % nasal spray 2 spray  2 spray Each Nare BID Nicholes Mango, MD   2 spray at 11/16/18 2120  . calcium carbonate (TUMS - dosed in mg elemental calcium) chewable tablet 400 mg of elemental calcium  2 tablet Oral Q8H PRN Nicholes Mango, MD  400 mg of elemental calcium at 11/16/18 2120  . docusate sodium (COLACE) capsule 100 mg  100 mg Oral BID Gouru, Aruna, MD   100 mg at 11/17/18 0920  . famotidine (PEPCID) tablet 20 mg  20 mg Oral Daily Manuella Ghazi, Vipul, MD   20 mg at 11/17/18 1320  . furosemide (LASIX) tablet 20 mg  20 mg Oral Daily Gouru, Aruna, MD   20 mg at 11/17/18 0920  . ketorolac (TORADOL) 15 MG/ML injection 15 mg  15 mg Intravenous Q6H PRN Gouru, Aruna, MD   15 mg at 11/16/18 1836  . levothyroxine (SYNTHROID, LEVOTHROID) tablet 50 mcg  50 mcg Oral Q0600 Nicholes Mango, MD   50 mcg at 11/17/18 0544   . ondansetron (ZOFRAN) tablet 4 mg  4 mg Oral Q6H PRN Gouru, Aruna, MD       Or  . ondansetron (ZOFRAN) injection 4 mg  4 mg Intravenous Q6H PRN Gouru, Aruna, MD      . polyethylene glycol (MIRALAX / GLYCOLAX) packet 17 g  17 g Oral Daily PRN Gouru, Aruna, MD      . polyethylene glycol (MIRALAX / GLYCOLAX) packet 17 g  17 g Oral Daily Gouru, Aruna, MD      . traMADol (ULTRAM) tablet 50 mg  50 mg Oral Q6H PRN Gouru, Aruna, MD   50 mg at 11/16/18 2120  . vitamin B-12 (CYANOCOBALAMIN) tablet 1,000 mcg  1,000 mcg Oral Daily Nicholes Mango, MD   Stopped at 11/17/18 9470  . vitamin B-12 (CYANOCOBALAMIN) tablet 5,000 mcg  5,000 mcg Oral Daily Gouru, Aruna, MD   5,000 mcg at 11/17/18 7615     Discharge Medications: Please see discharge summary for a list of discharge medications.  Relevant Imaging Results:  Relevant Lab Results:   Additional Information (SSN: [id number])  Zohal Reny, Veronia Beets, LCSW

## 2018-11-17 NOTE — Progress Notes (Signed)
   11/17/18 1400  Clinical Encounter Type  Visited With Patient and family together  Visit Type Follow-up  Referral From [place] received a referral from outgoing chaplain-on-call to provide AD education to this patient and his family. Patient's sons were at the bedside and reiterated interest in completed the [facility]. Chaplain provided education, as requested. Patient is scheduled to have surgery tomorrow evening; they are requesting to complete prior to that time. Follow-up required. Family will call/page when ready to complete.

## 2018-11-17 NOTE — Progress Notes (Signed)
[facility] [place] NAME: Matthew Hicks    MR#:  [id number]  DATE OF BIRTH:  [birth year]  SUBJECTIVE:  CHIEF COMPLAINT:   Chief Complaint  Patient presents with  . Hip Pain  no new complaints, son at bedside REVIEW OF SYSTEMS:  Review of Systems  Constitutional: Negative for chills, fever and weight loss.  HENT: Negative for nosebleeds and sore throat.   Eyes: Negative for blurred vision.  Respiratory: Negative for cough, shortness of breath and wheezing.   Cardiovascular: Negative for chest pain, orthopnea, leg swelling and PND.  Gastrointestinal: Negative for abdominal pain, constipation, diarrhea, heartburn, nausea and vomiting.  Genitourinary: Negative for dysuria and urgency.  Musculoskeletal: Positive for falls and joint pain. Negative for back pain.  Skin: Negative for rash.  Neurological: Negative for dizziness, speech change, focal weakness and headaches.  Endo/Heme/Allergies: Does not bruise/bleed easily.  Psychiatric/Behavioral: Negative for depression.   DRUG ALLERGIES:   Allergies  Allergen Reactions  . Amoxicillin-Pot Clavulanate Swelling    Angioedema Has patient had a PCN reaction causing immediate rash, facial/tongue/throat swelling, SOB or lightheadedness with hypotension: Yes Has patient had a PCN reaction causing severe rash involving mucus membranes or skin necrosis: No Has patient had a PCN reaction that required hospitalization: No Has patient had a PCN reaction occurring within the last 10 years: Yes If all of the above answers are "NO", then may proceed with Cephalosporin use.   Marland Kitchen Fesoterodine Fumarate Er Hives and Rash  . Sulfa Antibiotics Rash   VITALS:  Blood pressure 110/69, pulse (!) 59, temperature 98.1 F (36.7 C), temperature source Oral, resp. rate 17, height 5\' 5"  (1.651 m), weight 61 kg, SpO2 95 %. PHYSICAL EXAMINATION:  Physical Exam HENT:     Head: Normocephalic and atraumatic.  Eyes:    Conjunctiva/sclera: Conjunctivae normal.     Pupils: Pupils are equal, round, and reactive to light.  Neck:     Musculoskeletal: Normal range of motion and neck supple.     Thyroid: No thyromegaly.     Trachea: No tracheal deviation.  Cardiovascular:     Rate and Rhythm: Normal rate and regular rhythm.     Heart sounds: Normal heart sounds.  Pulmonary:     Effort: Pulmonary effort is normal. No respiratory distress.     Breath sounds: Normal breath sounds. No wheezing.  Chest:     Chest wall: No tenderness.  Abdominal:     General: Bowel sounds are normal. There is no distension.     Palpations: Abdomen is soft.     Tenderness: There is no abdominal tenderness.  Musculoskeletal:     Left hip: He exhibits decreased range of motion, decreased strength, tenderness and deformity.  Skin:    General: Skin is warm and dry.     Findings: No rash.  Neurological:     Mental Status: He is alert and oriented to person, place, and time.     Cranial Nerves: No cranial nerve deficit.    LABORATORY PANEL:  Male CBC Recent Labs  Lab 11/17/18 0403  WBC 8.5  HGB 9.4*  HCT 30.3*  PLT 271   ------------------------------------------------------------------------------------------------------------------ Chemistries  Recent Labs  Lab 11/17/18 0403  NA 136  K 4.2  CL 101  CO2 28  GLUCOSE 121*  BUN 23  CREATININE 1.17  CALCIUM 8.1*  AST 20  ALT 20  ALKPHOS 70  BILITOT 0.7   RADIOLOGY:  Dg Chest 1 View  Result  Date: 11/16/2018 CLINICAL DATA:  [place] on his left side getting out of a car today. Left hip fracture. Preoperative evaluation. EXAM: CHEST  1 VIEW COMPARISON:  Chest CT dated 08/09/2017. FINDINGS: Grossly stable enlarged cardiac silhouette and right basilar pleural fluid, pleural thickening and atelectasis. Stable mild prominence of the interstitial markings with normal vascularity. Diffuse osteopenia and thoracic spine degenerative changes. Cervical spine degenerative  changes. No fracture or pneumothorax seen. Bilateral carotid artery calcifications are noted in neck. IMPRESSION: 1. No acute findings. 2. Stable cardiomegaly, chronic interstitial lung disease and right pleural fluid and atelectasis. 3. Bilateral carotid artery atheromatous calcifications. Electronically Signed   By: Claudie Revering M.D.   On: 11/16/2018 15:19   Ct Head Wo Contrast  Result Date: 11/16/2018 CLINICAL DATA:  85 year old male status post fall with pain. EXAM: CT HEAD WITHOUT CONTRAST TECHNIQUE: Contiguous axial images were obtained from the base of the skull through the vertex without intravenous contrast. COMPARISON:  Head CT 10/07/2004. FINDINGS: Brain: Chronic cerebral white matter hypodensity has progressed since 2005, confluent in both hemispheres. Generalized cerebral volume decreased since that time. No ventriculomegaly. No midline shift, mass effect, or evidence of intracranial mass lesion. No acute intracranial hemorrhage identified. No cortically based acute infarct identified. No cortical encephalomalacia identified. The heterogeneity of the bilateral basal ganglia is new since 2005. Vascular: Calcified atherosclerosis at the skull base. No suspicious intracranial vascular hyperdensity. Skull: Stable and intact. Osteopenia. Sinuses/Orbits: Visualized paranasal sinuses and mastoids are stable and well pneumatized. Other: Postoperative changes to both globes. No scalp hematoma or acute orbit or scalp soft tissue finding identified. IMPRESSION: 1. No acute intracranial abnormality or acute traumatic injury identified. 2. Progressed chronic small vessel disease suspected since 2005. Electronically Signed   By: Genevie Ann M.D.   On: 11/16/2018 15:49   Dg Hip Unilat With Pelvis 2-3 Views Left  Result Date: 11/16/2018 CLINICAL DATA:  Left hip pain following a fall getting out of a car today. EXAM: DG HIP (WITH OR WITHOUT PELVIS) 2-3V LEFT COMPARISON:  None. FINDINGS: Comminuted left  intertrochanteric fracture with varus angulation. No significant displacement of the major fragments. Diffuse osteopenia. Prostate radiation seed implants. Suprapubic Foley catheter. Lower lumbar spine degenerative changes and scoliosis. IMPRESSION: Comminuted left intertrochanteric fracture with varus angulation. Electronically Signed   By: Claudie Revering M.D.   On: 11/16/2018 15:20   ASSESSMENT AND PLAN:   #Acute left intertrochanteric fracture Patient's last intake of Xarelto was on 11/15/2018, potential surgery Friday - can try CLD today and keep NPO after MN if ortho is ok  #Chronic atrial fibrillation-rate controlled Hold Xarelto Continue amiodarone  #Hypothyroidism continue Synthyroid  #Hyperlipidemia continue statin  #GERD H2 blocker     All the records are reviewed and case discussed with Care Management/Social Worker. Management plans discussed with the patient, family (son at bedside) and they are in agreement.  CODE STATUS: Full Code  TOTAL TIME TAKING CARE OF THIS PATIENT: 35 minutes.   More than 50% of the time was spent in counseling/coordination of care: YES  POSSIBLE D/C IN 2-3 DAYS, DEPENDING ON CLINICAL CONDITION.   Max Sane M.D on 11/17/2018 at 8:16 AM  Between 7am to 6pm - Pager - [phone]  After 6pm go to [email or web address] - Technical brewer [facility]  Office  [phone]  CC: Primary care physician; Kirk Ruths, MD  Note: This dictation was prepared with Dragon dictation along with smaller phrase technology. Any transcriptional errors that result from this  process are unintentional.

## 2018-11-17 NOTE — Anesthesia Preprocedure Evaluation (Deleted)
Anesthesia Evaluation    Airway        Dental   Pulmonary former smoker,           Cardiovascular hypertension,      Neuro/Psych    GI/Hepatic   Endo/Other    Renal/GU      Musculoskeletal   Abdominal   Peds  Hematology   Anesthesia Other Findings Past Medical History: 04/08/2014: A-fib ([place]) No date: Aortic stenosis No date: Cardiomyopathy [facility]) No date: Cataract 2003: CVA (cerebral infarction) No date: Dysrhythmia No date: GERD (gastroesophageal reflux disease) No date: Hearing loss No date: Hyperlipidemia 05/04/2014: Hypertension No date: Lung mass No date: Macular degeneration No date: Nephrolithiasis No date: Pernicious anemia 05/2015: Pneumonia 2007: Prostate cancer ([place])     Comment:  performed by Dr. Eliberto Ivory No date: Skin cancer No date: Spinal stenosis No date: Stroke [facility])   Reproductive/Obstetrics                             Anesthesia Physical Anesthesia Plan Anesthesia Quick Evaluation

## 2018-11-18 ENCOUNTER — Inpatient Hospital Stay: Payer: Medicare Other | Admitting: Anesthesiology

## 2018-11-18 ENCOUNTER — Encounter
Admission: EM | Disposition: A | Discharge status: Skilled Nursing Facility | Payer: Self-pay | Source: Home / Self Care | Attending: Internal Medicine

## 2018-11-18 ENCOUNTER — Inpatient Hospital Stay: Payer: Medicare Other

## 2018-11-18 ENCOUNTER — Encounter: Payer: Self-pay | Admitting: Anesthesiology

## 2018-11-18 HISTORY — PX: INTRAMEDULLARY (IM) NAIL INTERTROCHANTERIC: SHX5875

## 2018-11-18 LAB — ABO/RH: ABO/RH(D): O POS

## 2018-11-18 SURGERY — FIXATION, FRACTURE, INTERTROCHANTERIC, WITH INTRAMEDULLARY ROD
Anesthesia: Spinal | Site: Hip | Laterality: Left

## 2018-11-18 MED ORDER — SODIUM CHLORIDE 0.9 % IV SOLN
INTRAVENOUS | Status: DC | PRN
  Administered 2018-11-18: 20 ug/min via INTRAVENOUS

## 2018-11-18 MED ORDER — PHENOL 1.4 % MT LIQD
1.0000 | OROMUCOSAL | Status: DC | PRN
  Filled 2018-11-18: qty 177

## 2018-11-18 MED ORDER — ONDANSETRON HCL 4 MG/2ML IJ SOLN: 4.0000 mg | Freq: Four times a day (QID) | INTRAMUSCULAR | Status: DC | PRN

## 2018-11-18 MED ORDER — METOCLOPRAMIDE HCL 10 MG PO TABS: 5.0000 mg | ORAL_TABLET | Freq: Three times a day (TID) | ORAL | Status: DC | PRN

## 2018-11-18 MED ORDER — OXYCODONE HCL 5 MG PO TABS: 10.0000 mg | ORAL_TABLET | ORAL | Status: DC | PRN

## 2018-11-18 MED ORDER — FENTANYL CITRATE (PF) 100 MCG/2ML IJ SOLN: 25.0000 ug | INTRAMUSCULAR | Status: DC | PRN

## 2018-11-18 MED ORDER — HYDROMORPHONE HCL 1 MG/ML IJ SOLN: 0.5000 mg | INTRAMUSCULAR | Status: DC | PRN

## 2018-11-18 MED ORDER — MIDAZOLAM HCL 2 MG/2ML IJ SOLN
INTRAMUSCULAR | Status: AC
  Filled 2018-11-18: qty 2

## 2018-11-18 MED ORDER — ACETAMINOPHEN 10 MG/ML IV SOLN
1000.0000 mg | Freq: Four times a day (QID) | INTRAVENOUS | Status: AC
  Administered 2018-11-18 – 2018-11-19 (×4): 1000 mg via INTRAVENOUS
  Filled 2018-11-18 (×4): qty 100

## 2018-11-18 MED ORDER — BISACODYL 10 MG RE SUPP: 10.0000 mg | Freq: Every day | RECTAL | Status: DC | PRN

## 2018-11-18 MED ORDER — TRAMADOL HCL 50 MG PO TABS: 50.0000 mg | ORAL_TABLET | ORAL | Status: DC | PRN

## 2018-11-18 MED ORDER — RIVAROXABAN 10 MG PO TABS
10.0000 mg | ORAL_TABLET | Freq: Every day | ORAL | Status: DC
  Administered 2018-11-19: 10 mg via ORAL
  Filled 2018-11-18 (×2): qty 1

## 2018-11-18 MED ORDER — ACETAMINOPHEN 10 MG/ML IV SOLN
INTRAVENOUS | Status: AC
  Administered 2018-11-18: 1000 mg via INTRAVENOUS
  Filled 2018-11-18: qty 100

## 2018-11-18 MED ORDER — ACETAMINOPHEN 325 MG PO TABS: 325.0000 mg | ORAL_TABLET | Freq: Four times a day (QID) | ORAL | Status: DC | PRN

## 2018-11-18 MED ORDER — METOCLOPRAMIDE HCL 10 MG PO TABS
10.0000 mg | ORAL_TABLET | Freq: Three times a day (TID) | ORAL | Status: DC
  Administered 2018-11-18 – 2018-11-20 (×6): 10 mg via ORAL
  Filled 2018-11-18 (×7): qty 1

## 2018-11-18 MED ORDER — SODIUM CHLORIDE 0.9 % IV SOLN
INTRAVENOUS | Status: DC
  Administered 2018-11-18: 23:00:00 via INTRAVENOUS

## 2018-11-18 MED ORDER — BUPIVACAINE HCL (PF) 0.5 % IJ SOLN
INTRAMUSCULAR | Status: DC | PRN
  Administered 2018-11-18: 3 mL

## 2018-11-18 MED ORDER — SENNOSIDES-DOCUSATE SODIUM 8.6-50 MG PO TABS
1.0000 | ORAL_TABLET | Freq: Two times a day (BID) | ORAL | Status: DC
  Administered 2018-11-18 – 2018-11-19 (×3): 1 via ORAL
  Filled 2018-11-18 (×4): qty 1

## 2018-11-18 MED ORDER — PROPOFOL 500 MG/50ML IV EMUL
INTRAVENOUS | Status: DC | PRN
  Administered 2018-11-18: 40 ug/kg/min via INTRAVENOUS

## 2018-11-18 MED ORDER — SODIUM CHLORIDE 0.9 % IV SOLN
INTRAVENOUS | Status: DC | PRN
  Administered 2018-11-18: 17:00:00 via INTRAVENOUS

## 2018-11-18 MED ORDER — FLEET ENEMA 7-19 GM/118ML RE ENEM: 1.0000 | ENEMA | Freq: Once | RECTAL | Status: DC | PRN

## 2018-11-18 MED ORDER — ONDANSETRON HCL 4 MG PO TABS: 4.0000 mg | ORAL_TABLET | Freq: Four times a day (QID) | ORAL | Status: DC | PRN

## 2018-11-18 MED ORDER — METOCLOPRAMIDE HCL 5 MG/ML IJ SOLN: 5.0000 mg | Freq: Three times a day (TID) | INTRAMUSCULAR | Status: DC | PRN

## 2018-11-18 MED ORDER — SODIUM CHLORIDE 0.9 % IV SOLN
INTRAVENOUS | Status: DC | PRN
  Administered 2018-11-18: 500 mL

## 2018-11-18 MED ORDER — MENTHOL 3 MG MT LOZG
1.0000 | LOZENGE | OROMUCOSAL | Status: DC | PRN
  Filled 2018-11-18: qty 9

## 2018-11-18 MED ORDER — OXYCODONE HCL 5 MG PO TABS: 5.0000 mg | ORAL_TABLET | ORAL | Status: DC | PRN

## 2018-11-18 MED ORDER — CLINDAMYCIN PHOSPHATE 600 MG/50ML IV SOLN
600.0000 mg | Freq: Four times a day (QID) | INTRAVENOUS | Status: AC
  Administered 2018-11-18 – 2018-11-19 (×2): 600 mg via INTRAVENOUS
  Filled 2018-11-18 (×2): qty 50

## 2018-11-18 MED ORDER — MIDAZOLAM HCL 5 MG/5ML IJ SOLN
INTRAMUSCULAR | Status: DC | PRN
  Administered 2018-11-18: 1 mg via INTRAVENOUS

## 2018-11-18 MED ORDER — KETAMINE HCL 50 MG/ML IJ SOLN
INTRAMUSCULAR | Status: AC
  Filled 2018-11-18: qty 10

## 2018-11-18 MED ORDER — PROPOFOL 500 MG/50ML IV EMUL
INTRAVENOUS | Status: AC
  Filled 2018-11-18: qty 50

## 2018-11-18 MED ORDER — MAGNESIUM HYDROXIDE 400 MG/5ML PO SUSP
30.0000 mL | Freq: Every day | ORAL | Status: DC | PRN
  Administered 2018-11-19: 30 mL via ORAL
  Filled 2018-11-18: qty 30

## 2018-11-18 SURGICAL SUPPLY — 36 items
BIT DRILL 4.2 (DRILL) IMPLANT
BLADE TFNA HELICA 100 (Anchor) ×2 IMPLANT
CANISTER SUCT 1200ML W/VALVE (MISCELLANEOUS) ×3 IMPLANT
COVER WAND RF STERILE (DRAPES) ×3 IMPLANT
DRAPE C-ARMOR (DRAPES) ×3 IMPLANT
DRAPE SHEET LG 3/4 BI-LAMINATE (DRAPES) ×3 IMPLANT
DRILL 4.2 (DRILL) ×3
DRSG DERMACEA 8X12 NADH (GAUZE/BANDAGES/DRESSINGS) ×3 IMPLANT
DRSG OPSITE POSTOP 3X4 (GAUZE/BANDAGES/DRESSINGS) ×3 IMPLANT
DRSG OPSITE POSTOP 4X12 (GAUZE/BANDAGES/DRESSINGS) ×3 IMPLANT
DURAPREP 26ML APPLICATOR (WOUND CARE) ×3 IMPLANT
ELECT REM PT RETURN 9FT ADLT (ELECTROSURGICAL) ×3
ELECTRODE REM PT RTRN 9FT ADLT (ELECTROSURGICAL) ×1 IMPLANT
GAUZE SPONGE 4X4 12PLY STRL (GAUZE/BANDAGES/DRESSINGS) ×3 IMPLANT
GLOVE BIOGEL M STRL SZ7.5 (GLOVE) ×3 IMPLANT
GLOVE INDICATOR 8.0 STRL GRN (GLOVE) ×3 IMPLANT
GOWN STRL REUS W/ TWL LRG LVL3 (GOWN DISPOSABLE) ×2 IMPLANT
GOWN STRL REUS W/TWL LRG LVL3 (GOWN DISPOSABLE) ×6
KIT TURNOVER CYSTO (KITS) ×3 IMPLANT
MAT ABSORB  FLUID 56X50 GRAY (MISCELLANEOUS) ×2
MAT ABSORB FLUID 56X50 GRAY (MISCELLANEOUS) ×1 IMPLANT
NAIL CANN TFNA 11X130X380 LEFT (Nail) ×2 IMPLANT
NS IRRIG 1000ML POUR BTL (IV SOLUTION) ×3 IMPLANT
PACK HIP COMPR (MISCELLANEOUS) ×3 IMPLANT
REAMER ROD DEEP FLUTE 2.5X950 (INSTRUMENTS) ×3 IMPLANT
SCREW LOCKING 5.0X38MM (Screw) ×2 IMPLANT
SOL PREP PVP 2OZ (MISCELLANEOUS) ×3
SOLUTION PREP PVP 2OZ (MISCELLANEOUS) ×1 IMPLANT
STAPLER SKIN PROX 35W (STAPLE) ×3 IMPLANT
SUCTION FRAZIER HANDLE 10FR (MISCELLANEOUS) ×2
SUCTION TUBE FRAZIER 10FR DISP (MISCELLANEOUS) ×1 IMPLANT
SUT VIC AB 0 CT1 36 (SUTURE) ×3 IMPLANT
SUT VIC AB 1 CT1 36 (SUTURE) ×3 IMPLANT
SUT VIC AB 2-0 CT1 27 (SUTURE) ×3
SUT VIC AB 2-0 CT1 TAPERPNT 27 (SUTURE) ×1 IMPLANT
TAPE MICROFOAM 4IN (TAPE) ×3 IMPLANT

## 2018-11-18 NOTE — Care Management Note (Addendum)
Case Management Note  Patient Details  Name: Matthew Hicks. MRN: [id number] Date of Birth: 1925/04/20  Subjective/Objective:                  RNCM spoke with patient's son as patient was sleeping. Patient is pending surgery. He is from home with his wife and his daughter Matthew Hicks lives there also.  Patient will consider SNF. He ambulates at baseline with a cane but they have a front-wheeled walker also.  His address is [address] [place] [place] [address]. He uses Environmental manager ([facility]ide [employer]. [address]).  His PCP is Dr. Frazier Richards. Update: RNCM met with patient and a different son to explain home health agency list.   Action/Plan: Home health list provided per [email or web address].  RNCM will follow. Update: patient told CSW that he would like to use [facility] if able to return home.  Referral to [facility] with [place].   Expected Discharge Date:                  Expected Discharge Plan:     In-House Referral:     Discharge planning Services  CM Consult  Post Acute Care Choice:  Home Health Choice offered to:  Adult Children  DME Arranged:    DME Agency:     HH Arranged:    HH Agency:     Status of Service:  In process, will continue to follow  If discussed at Long Length of Stay Meetings, dates discussed:    Additional Comments:  Marshell Garfinkel, RN 11/18/2018, 8:52 AM

## 2018-11-18 NOTE — Progress Notes (Signed)
Ch received a pg in regards to finalizing an AD for the pt and wife. Ch assisted Levy Sjogren who had the initial interaction w/ pt and family. Both chaplains gather the witnesses and notary for the pt and family members. The finalized copy will be placed in the pt chart.     11/18/18 1400  Clinical Encounter Type  Visited With Patient and family together  Visit Type Initial;Social support  Referral From Nurse  Consult/Referral To Chaplain  Spiritual Encounters  Spiritual Needs Other (Comment) (finalize AD)  Stress Factors  Patient Stress Factors Health changes  Family Stress Factors None identified  Advance Directives (For Healthcare)  Does Patient Have a Medical Advance Directive? Yes  Does patient want to make changes to medical advance directive? Yes (ED - Information included in AVS)  Type of Advance Directive [place];Living will  Copy of [place] in Chart? Yes - validated most recent copy scanned in chart (See row information)  Copy of Living Will in Chart? Yes - validated most recent copy scanned in chart (See row information)  Would patient like information on creating a medical advance directive? No - Patient declined  [place]  Does Patient Have a Mental Health Advance Directive? No  Would patient like information on creating a mental health advance directive? No - Patient declined

## 2018-11-18 NOTE — Progress Notes (Signed)
   11/18/18 1407  Clinical Encounter Type  Visited With Patient and family together  Visit Type Follow-up  Consult/Referral To Chaplain  Spiritual Encounters  Spiritual Needs Brochure  Stress Factors  Patient Stress Factors None identified  Family Stress Factors None identified   Chaplain on-call [employer]) received a page regarding completion of an AD for this patient. Chaplain Marshell Dilauro (self) talked with the patient, patient's wife and sons about the document. Chaplains Alger Kerstein and [employer] collaborated to [employer] and witnesses. Scanned copy given to unit secretary for patient's file; original copies given to patient and family. Family expressed gratitude for assistance with this task.

## 2018-11-18 NOTE — Care Management Important Message (Signed)
Important Message  Patient Details  Name: Matthew Hicks. MRN: [id number] Date of Birth: Sep 07, 1925   Medicare Important Message Given:  Yes    [name] 11/18/2018, 11:14 AM

## 2018-11-18 NOTE — Anesthesia Post-op Follow-up Note (Signed)
Anesthesia QCDR form completed.        

## 2018-11-18 NOTE — Anesthesia Procedure Notes (Signed)
Spinal  Patient location during procedure: OR Start time: 11/18/2018 5:30 PM End time: 11/18/2018 5:32 PM Staffing Anesthesiologist: Gunnar Fusi, MD Resident/CRNA: Hedda Slade, CRNA Performed: anesthesiologist  Preanesthetic Checklist Completed: patient identified, site marked, surgical consent, pre-op evaluation, timeout performed, IV checked, risks and benefits discussed and monitors and equipment checked Spinal Block Patient position: sitting Prep: ChloraPrep Patient monitoring: heart rate, continuous pulse ox, blood pressure and cardiac monitor Approach: midline Location: L3-4 Injection technique: single-shot Needle Needle type: [place] and Introducer  Needle gauge: 24 G Needle length: 9 cm Additional Notes Negative paresthesia. Negative blood return. Positive free-flowing CSF. Expiration date of kit checked and confirmed. Patient tolerated procedure well, without complications.

## 2018-11-18 NOTE — Anesthesia Preprocedure Evaluation (Signed)
Anesthesia Evaluation  Patient identified by MRN, date of birth, ID band Patient awake    Reviewed: Allergy & Precautions, H&P , NPO status , Patient's Chart, lab work & pertinent test results, reviewed documented beta blocker date and time   History of Anesthesia Complications Negative for: history of anesthetic complications  Airway Mallampati: I  TM Distance: >3 FB Neck ROM: full    Dental  (+) Dental Advidsory Given, Teeth Intact, Missing, Poor Dentition   Pulmonary neg pulmonary ROS, neg shortness of breath, neg sleep apnea, neg COPD, neg recent URI, former smoker,           Cardiovascular hypertension, (-) angina(-) CAD, (-) Past MI, (-) Cardiac Stents and (-) CABG + dysrhythmias Atrial Fibrillation + Valvular Problems/Murmurs AS      Neuro/Psych neg Seizures CVA, Residual Symptoms negative psych ROS   GI/Hepatic Neg liver ROS, GERD  ,  Endo/Other  neg diabetesHypothyroidism   Renal/GU Renal disease (kidney stones)  negative genitourinary   Musculoskeletal   Abdominal   Peds  Hematology  (+) Blood dyscrasia, anemia ,   Anesthesia Other Findings Past Medical History: 04/08/2014: A-fib ([place]) No date: Aortic stenosis No date: Cardiomyopathy [facility]) No date: Cataract 2003: CVA (cerebral infarction) No date: Dysrhythmia No date: GERD (gastroesophageal reflux disease) No date: Hearing loss No date: Hyperlipidemia 05/04/2014: Hypertension No date: Lung mass No date: Macular degeneration No date: Nephrolithiasis No date: Pernicious anemia 05/2015: Pneumonia 2007: Prostate cancer ([place])     Comment:  performed by Dr. Eliberto Ivory No date: Skin cancer No date: Spinal stenosis No date: Stroke ([place])   Reproductive/Obstetrics negative OB ROS                             Anesthesia Physical Anesthesia Plan  ASA: III  Anesthesia Plan: Spinal   Post-op Pain Management:    Induction:   PONV  Risk Score and Plan: Propofol infusion and TIVA  Airway Management Planned: Natural Airway and Nasal Cannula  Additional Equipment:   Intra-op Plan:   Post-operative Plan:   Informed Consent: I have reviewed the patients History and Physical, chart, labs and discussed the procedure including the risks, benefits and alternatives for the proposed anesthesia with the patient or authorized representative who has indicated his/her understanding and acceptance.     Dental Advisory Given  Plan Discussed with: Anesthesiologist, CRNA and Surgeon  Anesthesia Plan Comments:         Anesthesia Quick Evaluation

## 2018-11-18 NOTE — Progress Notes (Signed)
Clinical Education officer, museum (CSW) received a Advertising account executive from patient's son [name] on 2/6 stating that they chose [facility]. CSW contacted [name] this morning to confirm D/C plan. Per [name] patient will have surgery today around 5 pm and they have chose [facility] for rehab. [facility] liaison is aware of above.   [employer], LCSW [phone]

## 2018-11-18 NOTE — Progress Notes (Signed)
[place] [facility] [place] NAME: Deion Swift    MR#:  [id number]  DATE OF BIRTH:  1925/05/16  SUBJECTIVE:  CHIEF COMPLAINT:   Chief Complaint  Patient presents with  . Hip Pain  Patient awaiting surgery complains of pain in the hip   REVIEW OF SYSTEMS:  Review of Systems  Constitutional: Negative for chills, fever and weight loss.  HENT: Negative for nosebleeds and sore throat.   Eyes: Negative for blurred vision.  Respiratory: Negative for cough, shortness of breath and wheezing.   Cardiovascular: Negative for chest pain, orthopnea, leg swelling and PND.  Gastrointestinal: Negative for abdominal pain, constipation, diarrhea, heartburn, nausea and vomiting.  Genitourinary: Negative for dysuria and urgency.  Musculoskeletal: Positive for falls and joint pain. Negative for back pain.  Skin: Negative for rash.  Neurological: Negative for dizziness, speech change, focal weakness and headaches.  Endo/Heme/Allergies: Does not bruise/bleed easily.  Psychiatric/Behavioral: Negative for depression.   DRUG ALLERGIES:   Allergies  Allergen Reactions  . Amoxicillin-Pot Clavulanate Swelling    Angioedema Has patient had a PCN reaction causing immediate rash, facial/tongue/throat swelling, SOB or lightheadedness with hypotension: Yes Has patient had a PCN reaction causing severe rash involving mucus membranes or skin necrosis: No Has patient had a PCN reaction that required hospitalization: No Has patient had a PCN reaction occurring within the last 10 years: Yes If all of the above answers are "NO", then may proceed with Cephalosporin use.   Marland Kitchen Fesoterodine Fumarate Er Hives and Rash  . Sulfa Antibiotics Rash   VITALS:  Blood pressure 125/68, pulse 94, temperature 98.4 F (36.9 C), temperature source Oral, resp. rate 18, height 5\' 5"  (1.651 m), weight 61 kg, SpO2 94 %. PHYSICAL EXAMINATION:  Physical Exam HENT:     Head: Normocephalic and  atraumatic.  Eyes:     Conjunctiva/sclera: Conjunctivae normal.     Pupils: Pupils are equal, round, and reactive to light.  Neck:     Musculoskeletal: Normal range of motion and neck supple.     Thyroid: No thyromegaly.     Trachea: No tracheal deviation.  Cardiovascular:     Rate and Rhythm: Normal rate and regular rhythm.     Heart sounds: Normal heart sounds.  Pulmonary:     Effort: Pulmonary effort is normal. No respiratory distress.     Breath sounds: Normal breath sounds. No wheezing.  Chest:     Chest wall: No tenderness.  Abdominal:     General: Bowel sounds are normal. There is no distension.     Palpations: Abdomen is soft.     Tenderness: There is no abdominal tenderness.  Musculoskeletal:     Left hip: He exhibits decreased range of motion, decreased strength, tenderness and deformity.  Skin:    General: Skin is warm and dry.     Findings: No rash.  Neurological:     Mental Status: He is alert and oriented to person, place, and time.     Cranial Nerves: No cranial nerve deficit.    LABORATORY PANEL:  Male CBC Recent Labs  Lab 11/17/18 0403  WBC 8.5  HGB 9.4*  HCT 30.3*  PLT 271   ------------------------------------------------------------------------------------------------------------------ Chemistries  Recent Labs  Lab 11/17/18 0403  NA 136  K 4.2  CL 101  CO2 28  GLUCOSE 121*  BUN 23  CREATININE 1.17  CALCIUM 8.1*  AST 20  ALT 20  ALKPHOS 70  BILITOT 0.7   RADIOLOGY:  No results found. ASSESSMENT AND PLAN:   #Acute left intertrochanteric fracture Patient's last intake of Xarelto was on 11/15/2018, potential surgery Friday Surgery later today  #Chronic atrial fibrillation-rate controlled Hold Xarelto Continue amiodarone  #Hypothyroidism continue Synthyroid  #Hyperlipidemia continue statin  #GERD H2 blocker     All the records are reviewed and case discussed with Care Management/Social Worker. Management plans  discussed with the patient, family (son at bedside) and they are in agreement.  CODE STATUS: Full Code  TOTAL TIME TAKING CARE OF THIS PATIENT: 45minutes.   More than 50% of the time was spent in counseling/coordination of care: YES  POSSIBLE D/C IN 2-3 DAYS, DEPENDING ON CLINICAL CONDITION.   Dustin Flock M.D on 11/18/2018 at 1:22 PM  Between 7am to 6pm - Pager - [phone]  After 6pm go to [email or web address] - Technical brewer [facility]  Office  [phone]  CC: Primary care physician; Kirk Ruths, MD  Note: This dictation was prepared with Dragon dictation along with smaller phrase technology. Any transcriptional errors that result from this process are unintentional.

## 2018-11-18 NOTE — Op Note (Signed)
OPERATIVE NOTE  DATE OF SURGERY:  11/18/2018  PATIENT NAME:  Matthew Hicks.   DOB: [birth year]  MRN: [id number]  PRE-OPERATIVE DIAGNOSIS: Left intertrochanteric femur fracture  POST-OPERATIVE DIAGNOSIS:  Same  PROCEDURE: Open reduction and internal fixation of a left intertrochanteric femur fracture   SURGEON:  Marciano Sequin. M.D.  ANESTHESIA: spinal  ESTIMATED BLOOD LOSS: 20 mL  FLUIDS REPLACED: 500 mL of crystalloid  DRAINS: None  IMPLANTS UTILIZED: Synthes 11 mm x 380 mm/130 trochanteric fixation nail, 810 mm helical blade, 38 mm x 5.0 mm locking screw  INDICATIONS FOR SURGERY: Matthew Hicks. is a 85 y.o. year old male who fell and sustained a displaced left intertrochanteric femur fracture. After discussion of the risks and benefits of surgical intervention, the patient expressed understanding of the risks benefits and agree with plans for open reduction and internal fixation.   The risks, benefits, and alternatives were discussed at length including but not limited to the risks of infection, bleeding, nerve injury, stiffness, blood clots, the need for revision surgery, limb length inequality, cardiopulmonary complications, among others, and they were willing to proceed.  PROCEDURE IN DETAIL: The patient was brought into the operating room and, after adequate spinal anesthesia was achieved, patient was placed on the fracture table. All bony prominences were well-padded. The left lower extremity was placed in traction and a provisional reduction was performed and verified using the C-arm. The patient's left hip and leg were cleaned and prepped with alcohol and DuraPrep and draped in the usual sterile fashion. A "timeout" was performed as per usual protocol. A lateral incision was made extended from the proximal portion of the greater trochanter proximally. The fascia was incised in line with the skin incision and the fibers of the hip abductors were split in line. The tip of the  greater trochanter was palpated and a distally threaded guide pin was inserted into the tip of the greater trochanter and advanced into the medullary canal. Position was confirmed in both AP and lateral planes using the C-arm. A pilot hole was enlarged using a step drill.  A beaded guidewire was exchanged for the initial guidewire and measurements were obtained to determine the length of the nail.  It was felt that a 380 mm long nail was appropriate.  The intramedullary canal was then reamed in a sequential fashion up to a 12 mm diameter.  A Synthes 11 mm x 381 mm/130 trochanteric fixation nail was advanced over the guidepin and position confirmed using the C-arm. A second stab incision was made and the tissue protector was inserted through the outrigger device and advanced to the lateral cortex of the femur. A threaded screw guide pin was inserted into the femoral neck and head and position was again confirmed in both AP and lateral planes. Vision was were obtained and it was felt that a 175 mm helical blade was appropriate. The cortex was reamed and then a cannulated reamer was advanced over the guidepin to the appropriate depth. A 102 mm helical blade was then advanced over the guidepin and impacted into place. Good position was noted in multiple planes using the C-arm. The locking sleeve was engaged. Finally, the C arm was positioned so as to visualize the distal locking holes of the nail.  A 38 mm x 5.0 mm locking screw was inserted through the oblong hole.  Position was confirmed in both AP and lateral planes using the C arm. The hip was visualized in all planes  using the C-arm with good reduction appreciated and good position of the hardware noted.  The wound was irrigated with copious amounts of normal saline with antibiotic solution and suctioned dry. Good hemostasis was appreciated. The fascia was reapproximated using interrupted sutures of #1 Vicryl. Subcutaneous tissue was approximated layers using  first #0 Vicryl followed #2-0 Vicryl. The skin was closed with skin staples. A sterile dressing was applied.  The patient tolerated the procedure well and was transported to the recovery room in stable condition.   Marciano Sequin., M.D.

## 2018-11-18 NOTE — Transfer of Care (Signed)
Immediate Anesthesia Transfer of Care Note  Patient: Matthew Hicks.  Procedure(s) Performed: INTRAMEDULLARY (IM) NAIL INTERTROCHANTRIC (Left Hip)  Patient Location: PACU  Anesthesia Type:Spinal  Level of Consciousness: awake  Airway & Oxygen Therapy: Patient Spontanous Breathing and Patient connected to face mask oxygen  Post-op Assessment: Report given to RN and Post -op Vital signs reviewed and stable  Post vital signs: Reviewed and stable  Last Vitals:  Vitals Value Taken Time  BP    Temp    Pulse    Resp    SpO2      Last Pain:  Vitals:   11/18/18 0750  TempSrc: Oral  PainSc: 0-No pain      Patients Stated Pain Goal: 0 (75/73/22 5672)  Complications: No apparent anesthesia complications

## 2018-11-19 LAB — CBC WITH DIFFERENTIAL/PLATELET
Abs Immature Granulocytes: 0.11 10*3/uL — ABNORMAL HIGH (ref 0.00–0.07)
BASOS PCT: 0 %
Basophils Absolute: 0 10*3/uL (ref 0.0–0.1)
Eosinophils Absolute: 0.1 10*3/uL (ref 0.0–0.5)
Eosinophils Relative: 1 %
HCT: 29.2 % — ABNORMAL LOW (ref 39.0–52.0)
Hemoglobin: 9.2 g/dL — ABNORMAL LOW (ref 13.0–17.0)
Immature Granulocytes: 1 %
Lymphocytes Relative: 4 %
Lymphs Abs: 0.7 10*3/uL (ref 0.7–4.0)
MCH: 25.8 pg — ABNORMAL LOW (ref 26.0–34.0)
MCHC: 31.5 g/dL (ref 30.0–36.0)
MCV: 81.8 fL (ref 80.0–100.0)
MONOS PCT: 9 %
Monocytes Absolute: 1.4 10*3/uL — ABNORMAL HIGH (ref 0.1–1.0)
Neutro Abs: 13.5 10*3/uL — ABNORMAL HIGH (ref 1.7–7.7)
Neutrophils Relative %: 85 %
Platelets: 231 10*3/uL (ref 150–400)
RBC: 3.57 MIL/uL — ABNORMAL LOW (ref 4.22–5.81)
RDW: 15.9 % — ABNORMAL HIGH (ref 11.5–15.5)
WBC: 15.9 10*3/uL — ABNORMAL HIGH (ref 4.0–10.5)
nRBC: 0 % (ref 0.0–0.2)

## 2018-11-19 NOTE — Progress Notes (Signed)
  Subjective: 1 Day Post-Op Procedure(s) (LRB): INTRAMEDULLARY (IM) NAIL INTERTROCHANTRIC (Left) Patient reports pain as mild.   Patient is well, and has had no acute complaints or problems PT and care management to assist with discharge planning, will likely need SNF placement. Negative for chest pain and shortness of breath Fever: no Gastrointestinal:Negative for nausea and vomiting  Objective: Vital signs in last 24 hours: Temp:  [97.3 F (36.3 C)-98.6 F (37 C)] 98.6 F (37 C) (02/08 0806) Pulse Rate:  [46-87] 87 (02/08 0806) Resp:  [12-20] 18 (02/08 0015) BP: (93-136)/(51-74) 103/65 (02/08 0806) SpO2:  [89 %-99 %] 99 % (02/08 0806)  Intake/Output from previous day:  Intake/Output Summary (Last 24 hours) at 11/19/2018 0945 Last data filed at 11/18/2018 2112 Gross per 24 hour  Intake 800 ml  Output 320 ml  Net 480 ml    Intake/Output this shift: No intake/output data recorded.  Labs: Recent Labs    11/16/18 1531 11/17/18 0403  HGB 11.1* 9.4*   Recent Labs    11/16/18 1531 11/17/18 0403  WBC 8.0 8.5  RBC 4.39 3.76*  HCT 35.6* 30.3*  PLT 294 271   Recent Labs    11/16/18 1531 11/17/18 0403  NA 136 136  K 4.2 4.2  CL 102 101  CO2 29 28  BUN 20 23  CREATININE 1.10 1.17  GLUCOSE 114* 121*  CALCIUM 8.1* 8.1*   Recent Labs    11/17/18 0403  INR 1.21     EXAM General - Patient is Alert, Appropriate and Oriented Extremity - ABD soft Sensation intact distally Intact pulses distally Dorsiflexion/Plantar flexion intact Incision: scant drainage No cellulitis present Dressing/Incision - blood tinged drainage Motor Function - intact, moving foot and toes well on exam.  Abdomen soft with normal BS.  Past Medical History:  Diagnosis Date  . A-fib ([place]) 04/08/2014  . Aortic stenosis   . Cardiomyopathy ([place])   . Cataract   . CVA (cerebral infarction) 2003  . Dysrhythmia   . GERD (gastroesophageal reflux disease)   . Hearing loss   . Hyperlipidemia    . Hypertension 05/04/2014  . Lung mass   . Macular degeneration   . Nephrolithiasis   . Pernicious anemia   . Pneumonia 05/2015  . Prostate cancer [facility]) 2007   performed by Dr. Eliberto Ivory  . Skin cancer   . Spinal stenosis   . Stroke [facility])     Assessment/Plan: 1 Day Post-Op Procedure(s) (LRB): INTRAMEDULLARY (IM) NAIL INTERTROCHANTRIC (Left) Active Problems:   Closed left hip fracture, initial encounter ([place])  Estimated body mass index is 22.38 kg/m as calculated from the following:   Height as of this encounter: 5\' 5"  (1.651 m).   Weight as of this encounter: 61 kg. Advance diet Up with therapy D/C IV fluids when tolerating po intake.  Labs reviewed this AM.  Hg 9.4 this AM. Up with therapy today, will likely need SNF placement. Begin working on BM.  DVT Prophylaxis - Xarelto, Foot Pumps and TED hose Weight-Bearing as tolerated to left leg  J. Cameron Proud, PA-C [facility] Orthopaedic Surgery 11/19/2018, 9:45 AM

## 2018-11-19 NOTE — Evaluation (Signed)
Physical Therapy Evaluation Patient Details Name: Matthew Hicks. MRN: [id number] DOB: Mar 28, 1925 Today's Date: 11/19/2018   History of Present Illness  Adreyan Carbajal. is a 85 y.o. male s/p ORIF for left intertrochanteric femur fracture. Pt with a past medical history of A. fib, CVA, hypertension, hyperlipidemia, and kidney stone with suprapubic catheter. Arrived in the ED after a fall on 11/17/2018  Clinical Impression  Pt was seen for mobility and strength testing, and noted his ability to assist standing with some increased L hip pain but could not use RW to stand.  His control on LLE is limited by ER posture which makes the foot slide out from under him with standing.  Follow acutely for mobility training to increase LLE standing stability and reduce buckling of LE as he tries extended standing.  Will work toward gait as tolerated.    Follow Up Recommendations SNF    Equipment Recommendations  None recommended by PT    Recommendations for Other Services       Precautions / Restrictions Precautions Precautions: Fall(telemetry) Precaution Comments: anxious to move, on O2 Restrictions Weight Bearing Restrictions: Yes Other Position/Activity Restrictions: WBAT LLE      Mobility  Bed Mobility Overal bed mobility: Needs Assistance Bed Mobility: Supine to Sit;Sit to Supine     Supine to sit: Max assist Sit to supine: Total assist   General bed mobility comments: pt follows along with PT but cannot assist LE's  Transfers Overall transfer level: Needs assistance Equipment used: Rolling walker (2 wheeled);1 person hand held assist Transfers: Sit to/from Stand Sit to Stand: Max assist;From elevated surface(to partially stand)         General transfer comment: walker not helpful for one, requires two person assist to fully stand  Ambulation/Gait             General Gait Details: unable  Stairs            Wheelchair Mobility    Modified Rankin (Stroke  Patients Only)       Balance Overall balance assessment: History of Falls;Needs assistance Sitting-balance support: Feet supported;Bilateral upper extremity supported(uses both hands to grip the railing and bed) Sitting balance-Leahy Scale: Fair       Standing balance-Leahy Scale: Poor                               Pertinent Vitals/Pain Pain Assessment: 0-10 Pain Score: 8  Pain Location: L lateral hip Pain Descriptors / Indicators: Operative site guarding;Grimacing Pain Intervention(s): Monitored during session;Limited activity within patient's tolerance;Premedicated before session;Repositioned;Patient requesting pain meds-RN notified;Ice applied    Home Living Family/patient expects to be discharged to:: Private residence Living Arrangements: Spouse/significant other Available Help at Discharge: Family;Available 24 hours/day Type of Home: House Home Access: Stairs to enter Entrance Stairs-Rails: Right;Left(will not be able to reach both) Entrance Stairs-Number of Steps: 2 +1 Home Layout: One level        Prior Function Level of Independence: Independent with assistive device(s)         Comments: SPC at home     Hand Dominance   Dominant Hand: Right    Extremity/Trunk Assessment   Upper Extremity Assessment Upper Extremity Assessment: Generalized weakness    Lower Extremity Assessment Lower Extremity Assessment: Generalized weakness;LLE deficits/detail LLE Deficits / Details: 2- hip strength post op with pain LLE: Unable to fully assess due to immobilization;Unable to fully assess due to pain LLE  Coordination: decreased fine motor;decreased gross motor    Cervical / Trunk Assessment Cervical / Trunk Assessment: Kyphotic  Communication   Communication: HOH;Other (comment)(needs to read lips or to speak loudly in his ear)  Cognition Arousal/Alertness: Awake/alert Behavior During Therapy: Flat affect Overall Cognitive Status: Within  Functional Limits for tasks assessed                                 General Comments: pt does not respond unless he can really hear the quesion      General Comments General comments (skin integrity, edema, etc.): pt is afraid to fall again, limited effort and tolerance for standing and sitting OOB    Exercises     Assessment/Plan    PT Assessment Patient needs continued PT services  PT Problem List Decreased strength;Decreased range of motion;Decreased activity tolerance;Decreased balance;Decreased mobility;Decreased coordination;Decreased knowledge of use of DME;Decreased safety awareness;Decreased knowledge of precautions;Cardiopulmonary status limiting activity;Decreased skin integrity;Pain       PT Treatment Interventions DME instruction;Gait training;Stair training;Functional mobility training;Therapeutic activities;Therapeutic exercise;Balance training;Neuromuscular re-education;Patient/family education    PT Goals (Current goals can be found in the Care Plan section)  Acute Rehab PT Goals Patient Stated Goal: to get stronger and feel less pain PT Goal Formulation: With patient/family Time For Goal Achievement: 12/03/18 Potential to Achieve Goals: Good    Frequency BID   Barriers to discharge Inaccessible home environment;Decreased caregiver support stairs and wife will not be able to assist him    Co-evaluation   Reason for Co-Treatment: To address functional/ADL transfers;For patient/therapist safety   OT goals addressed during session: Proper use of Adaptive equipment and DME;ADL's and self-care       AM-PAC PT "6 Clicks" Mobility  Outcome Measure Help needed turning from your back to your side while in a flat bed without using bedrails?: A Lot Help needed moving from lying on your back to sitting on the side of a flat bed without using bedrails?: A Lot Help needed moving to and from a bed to a chair (including a wheelchair)?: A Lot Help needed  standing up from a chair using your arms (e.g., wheelchair or bedside chair)?: A Lot Help needed to walk in hospital room?: Total Help needed climbing 3-5 steps with a railing? : Total 6 Click Score: 10    End of Session Equipment Utilized During Treatment: Gait belt;Oxygen Activity Tolerance: Patient tolerated treatment well;Patient limited by fatigue;Patient limited by pain Patient left: in bed;with call bell/phone within reach;with bed alarm set;with family/visitor present Nurse Communication: Mobility status PT Visit Diagnosis: Unsteadiness on feet (R26.81);Muscle weakness (generalized) (M62.81);History of falling (Z91.81);Adult, failure to thrive (R62.7)    Time: 2836-6294 PT Time Calculation (min) (ACUTE ONLY): 39 min   Charges:   PT Evaluation $PT Eval Moderate Complexity: 1 Mod PT Treatments $Therapeutic Exercise: 8-22 mins $Therapeutic Activity: 8-22 mins       Ramond Dial 11/19/2018, 2:42 PM  Mee Hives, PT MS Acute Rehab Dept. Number: [place]

## 2018-11-19 NOTE — Evaluation (Signed)
Occupational Therapy Evaluation Patient Details Name: Matthew Hicks. MRN: [id number] DOB: [birth year] Today's Date: 11/19/2018    History of Present Illness Matthew Hicks. is a 85 y.o. male s/p ORIF for left intertrochanteric femur fracture. Pt with a past medical history of A. fib, CVA, hypertension, hyperlipidemia, and kidney stone with suprapubic catheter. Arrived in the ED after a fall on 11/17/2018   Clinical Impression   Pt seen for OT evaluation this date, POD#1 from above surgery. Pt was modified independent in all ADLs prior to surgery, however occasionally using a SPC for mobility outside of his home. Pt's son present at bedside and able to assist with providing information since pt is HOH at baseline. Pt currently requires +2 max assist for functional mobility. Pt limited by pain and fatigue on this date, so ADLs not formally assessed. Suspect pt require max assist for LB ADLs while in seated position due to pain and limited AROM of L hip. Pt instructed and caregivers educated in falls prevention strategies, home/routines modifications, & DME/AE for LB bathing and dressing tasks. Pt would benefit from additional instruction in self care skills and techniques with or without assistive devices to support recall and carryover prior to discharge. Recommend SNF upon discharge.       Follow Up Recommendations  SNF;Supervision/Assistance - 24 hour    Equipment Recommendations  (TBD at next venue of care. )    Recommendations for Other Services       Precautions / Restrictions Precautions Precautions: Fall(telemetry) Precaution Comments: anxious to move, on O2 Restrictions Weight Bearing Restrictions: Yes Other Position/Activity Restrictions: WBAT LLE      Mobility Bed Mobility Overal bed mobility: Needs Assistance Bed Mobility: Supine to Sit;Sit to Supine     Supine to sit: Max assist Sit to supine: Total assist   General bed mobility comments: Pt unable to assist with  LE's to come EOB  Transfers Overall transfer level: Needs assistance Equipment used: Rolling walker (2 wheeled);2 person hand held assist Transfers: Sit to/from Stand Sit to Stand: Max assist;From elevated surface         General transfer comment: Requires two person assist to fully stand.     Balance Overall balance assessment: History of Falls;Needs assistance Sitting-balance support: Feet supported;Bilateral upper extremity supported Sitting balance-Leahy Scale: Fair     Standing balance support: Bilateral upper extremity supported Standing balance-Leahy Scale: Poor                             ADL either performed or assessed with clinical judgement   ADL Overall ADL's : Needs assistance/impaired                                       General ADL Comments: Pt with generalized weakness requiring +2 max assist to complete stand pivot transfer to room recliner on this date. Significant decrease in ADL function from baseline. Suspect+2 Mod/Max assist grossly for ADLs including functional mobility. PT recommending sit-to-stand lift for further transfers until Pt able to recover some strength.      Vision Baseline Vision/History: Wears glasses Wears Glasses: At all times Patient Visual Report: No change from baseline       Perception     Praxis      Pertinent Vitals/Pain Pain Assessment: 0-10 Pain Score: 8  Pain Location: L lateral hip Pain  Descriptors / Indicators: Operative site guarding;Grimacing Pain Intervention(s): Monitored during session;Limited activity within patient's tolerance;Repositioned;Premedicated before session     Hand Dominance Right   Extremity/Trunk Assessment Upper Extremity Assessment Upper Extremity Assessment: Generalized weakness   Lower Extremity Assessment Lower Extremity Assessment: Generalized weakness;Defer to PT evaluation;LLE deficits/detail LLE Deficits / Details: S/p L hip ORIF. LLE: Unable to fully  assess due to immobilization;Unable to fully assess due to pain LLE Coordination: decreased fine motor;decreased gross motor   Cervical / Trunk Assessment Cervical / Trunk Assessment: Kyphotic   Communication Communication Communication: HOH;Other (comment)   Cognition Arousal/Alertness: Awake/alert Behavior During Therapy: Flat affect Overall Cognitive Status: Within Functional Limits for tasks assessed                                 General Comments: Pt HOH has difficult time responding unles he hears you clearly.    General Comments  Pt endorses fear of falling again. Effort to stand appeared limited by fear/pain. Provided encouragement and physical assist as needed.     Exercises Other Exercises Other Exercises: Pt and caregivers (sons) educated on falls prevention strategies including environmental modifications, safe use of AE/DME for ADLs, and routines/activity modifications in order to decrease risk of falls and improve functional independence upon DC.    Shoulder Instructions      Home Living Family/patient expects to be discharged to:: Private residence Living Arrangements: Spouse/significant other Available Help at Discharge: Family;Available 24 hours/day Type of Home: House Home Access: Stairs to enter [employer] of Steps: 2 front entrance 5 back entrance (typically uses back) Handrails on both. Entrance Stairs-Rails: Right;Left(could not reach both) Home Layout: One level         Bathroom Toilet: Handicapped height Bathroom Accessibility: Yes How Accessible: Accessible via walker Home Equipment: [place] - 2 wheels;Cane - single point          Prior Functioning/Environment Level of Independence: Independent with assistive device(s)        Comments: SPC at home        OT Problem List: Decreased strength;Decreased range of motion;Decreased coordination;Impaired balance (sitting and/or standing);Decreased knowledge of use of  DME or AE;Decreased knowledge of precautions;Impaired UE functional use;Decreased safety awareness;Decreased activity tolerance      OT Treatment/Interventions: Self-care/ADL training;Therapeutic exercise;Patient/family education;Energy conservation;DME and/or AE instruction;Therapeutic activities    OT Goals(Current goals can be found in the care plan section) Acute Rehab OT Goals Patient Stated Goal: to get stronger and feel less pain OT Goal Formulation: With patient Time For Goal Achievement: 12/03/18 Potential to Achieve Goals: Good ADL Goals Pt Will Perform Grooming: sitting;with adaptive equipment;with set-up(With LRAD for safety and improved functional independence) Pt Will Perform Lower Body Bathing: with mod assist;sitting/lateral leans;with set-up(With LRAD for safety and improved functional independence) Pt Will Perform Lower Body Dressing: with mod assist;sitting/lateral leans;with adaptive equipment(With LRAD for safety and improved functional independence) Pt Will Transfer to Toilet: with mod assist;stand pivot transfer;bedside commode(With LRAD for safety and improved functional independence)  OT Frequency: Min 2X/week   Barriers to D/C: Inaccessible home environment  Pt with steps to enter the home.        Co-evaluation PT/OT/SLP Co-Evaluation/Treatment: Yes Reason for Co-Treatment: For patient/therapist safety;To address functional/ADL transfers   OT goals addressed during session: Proper use of Adaptive equipment and DME;ADL's and self-care      AM-PAC OT "6 Clicks" Daily Activity     Outcome Measure Help from  another person eating meals?: A Little Help from another person taking care of personal grooming?: A Lot Help from another person toileting, which includes using toliet, bedpan, or urinal?: A Lot Help from another person bathing (including washing, rinsing, drying)?: A Lot Help from another person to put on and taking off regular upper body clothing?: A  Lot Help from another person to put on and taking off regular lower body clothing?: A Lot 6 Click Score: 13   End of Session Equipment Utilized During Treatment: Gait belt;Rolling walker;Oxygen  Activity Tolerance: Patient limited by pain;Patient limited by fatigue Patient left: in chair;with chair alarm set;with call bell/phone within reach;with family/visitor present;with SCD's reapplied  OT Visit Diagnosis: Unsteadiness on feet (R26.81);Other abnormalities of gait and mobility (R26.89);History of falling (Z91.81);Repeated falls (R29.6);Pain Pain - Right/Left: Left Pain - part of body: Hip                Time: 1320-1401 OT Time Calculation (min): 41 min Charges:  OT General Charges $OT Visit: 1 Visit OT Evaluation $OT Eval Moderate Complexity: 1 Mod OT Treatments $Self Care/Home Management : 8-22 mins  Shara Blazing, M.S., OTR/L Ascom: [phone] 11/19/18, 3:29 PM

## 2018-11-19 NOTE — Progress Notes (Signed)
[facility] [place] NAME: Matthew Hicks    MR#:  [id number]  DATE OF BIRTH:  Apr 21, 1925  SUBJECTIVE:  CHIEF COMPLAINT:   Chief Complaint  Patient presents with  . Hip Pain  Patient underwent surgery yesterday His pain is under control today   REVIEW OF SYSTEMS:  Review of Systems  Constitutional: Negative for chills, fever and weight loss.  HENT: Negative for nosebleeds and sore throat.   Eyes: Negative for blurred vision.  Respiratory: Negative for cough, shortness of breath and wheezing.   Cardiovascular: Negative for chest pain, orthopnea, leg swelling and PND.  Gastrointestinal: Negative for abdominal pain, constipation, diarrhea, heartburn, nausea and vomiting.  Genitourinary: Negative for dysuria and urgency.  Musculoskeletal: Positive for falls and joint pain. Negative for back pain.  Skin: Negative for rash.  Neurological: Negative for dizziness, speech change, focal weakness and headaches.  Endo/Heme/Allergies: Does not bruise/bleed easily.  Psychiatric/Behavioral: Negative for depression.   DRUG ALLERGIES:   Allergies  Allergen Reactions  . Amoxicillin-Pot Clavulanate Swelling    Angioedema Has patient had a PCN reaction causing immediate rash, facial/tongue/throat swelling, SOB or lightheadedness with hypotension: Yes Has patient had a PCN reaction causing severe rash involving mucus membranes or skin necrosis: No Has patient had a PCN reaction that required hospitalization: No Has patient had a PCN reaction occurring within the last 10 years: Yes If all of the above answers are "NO", then may proceed with Cephalosporin use.   Marland Kitchen Fesoterodine Fumarate Er Hives and Rash  . Sulfa Antibiotics Rash   VITALS:  Blood pressure (!) 107/59, pulse 64, temperature 98.2 F (36.8 C), temperature source Oral, resp. rate 18, height 5\' 5"  (1.651 m), weight 61 kg, SpO2 99 %. PHYSICAL EXAMINATION:  Physical Exam HENT:     Head:  Normocephalic and atraumatic.  Eyes:     Conjunctiva/sclera: Conjunctivae normal.     Pupils: Pupils are equal, round, and reactive to light.  Neck:     Musculoskeletal: Normal range of motion and neck supple.     Thyroid: No thyromegaly.     Trachea: No tracheal deviation.  Cardiovascular:     Rate and Rhythm: Normal rate and regular rhythm.     Heart sounds: Normal heart sounds.  Pulmonary:     Effort: Pulmonary effort is normal. No respiratory distress.     Breath sounds: Normal breath sounds. No wheezing.  Chest:     Chest wall: No tenderness.  Abdominal:     General: Bowel sounds are normal. There is no distension.     Palpations: Abdomen is soft.     Tenderness: There is no abdominal tenderness.  Musculoskeletal:     Left hip: He exhibits decreased range of motion, decreased strength, tenderness and deformity.  Skin:    General: Skin is warm and dry.     Findings: No rash.  Neurological:     Mental Status: He is alert and oriented to person, place, and time.     Cranial Nerves: No cranial nerve deficit.    LABORATORY PANEL:  Male CBC Recent Labs  Lab 11/17/18 0403  WBC 8.5  HGB 9.4*  HCT 30.3*  PLT 271   ------------------------------------------------------------------------------------------------------------------ Chemistries  Recent Labs  Lab 11/17/18 0403  NA 136  K 4.2  CL 101  CO2 28  GLUCOSE 121*  BUN 23  CREATININE 1.17  CALCIUM 8.1*  AST 20  ALT 20  ALKPHOS 70  BILITOT 0.7  RADIOLOGY:  Dg Hip Operative Unilat W Or W/o Pelvis Left  Result Date: 11/18/2018 CLINICAL DATA:  Operative fixation of a left hip fracture. EXAM: OPERATIVE LEFT HIP (WITH PELVIS IF PERFORMED) 4 VIEWS TECHNIQUE: Fluoroscopic spot image(s) were submitted for interpretation post-operatively. COMPARISON:  11/16/2018. FINDINGS: Four C-arm views of the left hip and femur demonstrate intramedullary rod and nail fixation of the previously demonstrated left intertrochanteric  fracture with normal alignment. IMPRESSION: Hardware fixation of the previously demonstrated left intertrochanteric fracture with normal alignment. Electronically Signed   By: Claudie Revering M.D.   On: 11/18/2018 19:38   Dg Femur Port Min 2 Views Left  Result Date: 11/18/2018 CLINICAL DATA:  Post LEFT hip ORIF EXAM: LEFT FEMUR PORTABLE 2 VIEWS COMPARISON:  Portable exam 2021 hours compared to 11/16/2018 FINDINGS: Placement of an IM nail and dynamic screw at the proximal LEFT femur post ORIF of a reduced intertrochanteric fracture. Bones appear demineralized. Brachytherapy seed implants at prostate bed. No additional fracture, dislocation, or bone destruction. Scattered atherosclerotic calcifications. IMPRESSION: Post ORIF of intertrochanteric fracture LEFT femur. Electronically Signed   By: Lavonia Dana M.D.   On: 11/18/2018 20:50   ASSESSMENT AND PLAN:   #Acute left intertrochanteric fracture Status post surgical repair  #Chronic atrial fibrillation-rate controlled Xarelto has been resumed Continue amiodarone  #Hypothyroidism continue Synthyroid  #Hyperlipidemia continue statin  #GERD H2 blocker     All the records are reviewed and case discussed with Care Management/Social Worker. Management plans discussed with the patient, family (son at bedside) and they are in agreement.  CODE STATUS: Full Code  TOTAL TIME TAKING CARE OF THIS PATIENT: 2minutes.   More than 50% of the time was spent in counseling/coordination of care: YES  POSSIBLE D/C IN 2-3 DAYS, DEPENDING ON CLINICAL CONDITION.   Matthew Hicks M.D on 11/19/2018 at 1:45 PM  Between 7am to 6pm - Pager - [phone]  After 6pm go to [email or web address] - [employer] [facility]  Office  [phone]  CC: Primary care physician; Matthew Ruths, MD  Note: This dictation was prepared with Dragon dictation along with smaller phrase technology. Any transcriptional errors that result  from this process are unintentional.

## 2018-11-19 NOTE — Progress Notes (Signed)
Physical Therapy Treatment Patient Details Name: Matthew Hicks. MRN: [id number] DOB: [birth year] Today's Date: 11/19/2018    History of Present Illness Matthew Hicks. is a 85 y.o. male s/p ORIF for left intertrochanteric femur fracture. Pt with a past medical history of A. fib, CVA, hypertension, hyperlipidemia, and kidney stone with suprapubic catheter. Arrived in the ED after a fall on 11/17/2018    PT Comments    Pt was seen for mobility to stand and transition to chair.  His fear of falling again has limited his ability to control pivoting and sitting from initial standing at the bed.  However, with the nurse and OT was able to be convinced to let therapist assist him to chair with pivoting and mod assist of two.  Will continue to work on tolerance for standing, WB on LLE and controlling balance with RW including gait as pt can tolerate.   Follow Up Recommendations  SNF     Equipment Recommendations  None recommended by PT    Recommendations for Other Services       Precautions / Restrictions Precautions Precautions: Fall Precaution Comments: on O2, fearful of falling again Restrictions Weight Bearing Restrictions: Yes Other Position/Activity Restrictions: WBAT LLE    Mobility  Bed Mobility Overal bed mobility: Needs Assistance Bed Mobility: Supine to Sit     Supine to sit: Max assist     General bed mobility comments: more assistance to scoot and less to control sitting balance  Transfers Overall transfer level: Needs assistance Equipment used: Rolling walker (2 wheeled);2 person hand held assist Transfers: Sit to/from [facility] Sit to Stand: Max assist Stand pivot transfers: Max assist;+2 physical assistance;+2 safety/equipment       General transfer comment: two person assist to pivot in standing to chair  Ambulation/Gait             General Gait Details: unable   Stairs             Wheelchair Mobility    Modified  Rankin (Stroke Patients Only)       Balance     Sitting balance-Leahy Scale: Fair     Standing balance support: Bilateral upper extremity supported Standing balance-Leahy Scale: Poor                              Cognition Arousal/Alertness: Awake/alert Behavior During Therapy: Flat affect Overall Cognitive Status: Within Functional Limits for tasks assessed                                        Exercises      General Comments        Pertinent Vitals/Pain Pain Assessment: Faces Pain Score: 1  Faces Pain Scale: Hurts even more Pain Location: L lateral hip Pain Descriptors / Indicators: Operative site guarding;Grimacing Pain Intervention(s): Monitored during session;Repositioned;Ice applied    Home Living                      Prior Function            PT Goals (current goals can now be found in the care plan section) Acute Rehab PT Goals Patient Stated Goal: to feel stronger and go home Progress towards PT goals: Progressing toward goals    Frequency    BID  PT Plan Current plan remains appropriate    Co-evaluation PT/OT/SLP Co-Evaluation/Treatment: Yes Reason for Co-Treatment: Complexity of the patient's impairments (multi-system involvement);For patient/therapist safety PT goals addressed during session: Mobility/safety with mobility;Proper use of DME        AM-PAC PT "6 Clicks" Mobility   Outcome Measure  Help needed turning from your back to your side while in a flat bed without using bedrails?: A Lot Help needed moving from lying on your back to sitting on the side of a flat bed without using bedrails?: A Lot Help needed moving to and from a bed to a chair (including a wheelchair)?: A Lot Help needed standing up from a chair using your arms (e.g., wheelchair or bedside chair)?: A Lot Help needed to walk in hospital room?: Total Help needed climbing 3-5 steps with a railing? : Total 6 Click Score:  10    End of Session Equipment Utilized During Treatment: Gait belt;Oxygen Activity Tolerance: Patient tolerated treatment well;Patient limited by fatigue;Patient limited by pain Patient left: in bed;with call bell/phone within reach;with bed alarm set;with family/visitor present Nurse Communication: Mobility status PT Visit Diagnosis: Unsteadiness on feet (R26.81);Muscle weakness (generalized) (M62.81);History of falling (Z91.81);Adult, failure to thrive (R62.7)     Time: 9563-8756 PT Time Calculation (min) (ACUTE ONLY): 24 min  Charges:  $Therapeutic Activity: 8-22 mins                    Ramond Dial 11/19/2018, 9:43 PM  Mee Hives, PT MS Acute Rehab Dept. Number: [place] [facility] [place]

## 2018-11-20 LAB — BASIC METABOLIC PANEL
Anion gap: 6 (ref 5–15)
BUN: 27 mg/dL — ABNORMAL HIGH (ref 8–23)
CO2: 28 mmol/L (ref 22–32)
Calcium: 7.7 mg/dL — ABNORMAL LOW (ref 8.9–10.3)
Chloride: 99 mmol/L (ref 98–111)
Creatinine, Ser: 1.17 mg/dL (ref 0.61–1.24)
GFR calc Af Amer: >60 mL/min (ref >60)
GFR calc non Af Amer: 54 mL/min — ABNORMAL LOW (ref >60)
Glucose, Bld: 99 mg/dL (ref 70–99)
POTASSIUM: 3.8 mmol/L (ref 3.5–5.1)
Sodium: 133 mmol/L — ABNORMAL LOW (ref 135–145)

## 2018-11-20 LAB — CBC
HCT: 28.5 % — ABNORMAL LOW (ref 39.0–52.0)
Hemoglobin: 8.9 g/dL — ABNORMAL LOW (ref 13.0–17.0)
MCH: 25.1 pg — ABNORMAL LOW (ref 26.0–34.0)
MCHC: 31.2 g/dL (ref 30.0–36.0)
MCV: 80.3 fL (ref 80.0–100.0)
Platelets: 262 10*3/uL (ref 150–400)
RBC: 3.55 MIL/uL — ABNORMAL LOW (ref 4.22–5.81)
RDW: 15.8 % — ABNORMAL HIGH (ref 11.5–15.5)
WBC: 13.4 10*3/uL — ABNORMAL HIGH (ref 4.0–10.5)
nRBC: 0 % (ref 0.0–0.2)

## 2018-11-20 MED ORDER — OXYCODONE HCL 5 MG PO TABS: 5.0000 mg | ORAL_TABLET | ORAL | 0 refills | Status: DC | PRN

## 2018-11-20 NOTE — Progress Notes (Signed)
Physical Therapy Treatment Patient Details Name: Matthew Hicks. MRN: [id number] DOB: July 10, 1925 Today's Date: 11/20/2018    History of Present Illness Torence Palmeri. is a 85 y.o. male s/p ORIF for left intertrochanteric femur fracture. Pt with a past medical history of A. fib, CVA, hypertension, hyperlipidemia, and kidney stone with suprapubic catheter. Arrived in the ED after a fall on 11/17/2018    PT Comments    Patient initially with RN staff, PT assisted with rolling patient, which he was able to participate in, but was limited secondary to pain and weakness. He was able to sit independently at the EOB without UE support, however is still quite limited with transfers secondary to generalized LE weakness and fear of falling. He was able to participate in stand pivot transfer, and appears to be progressing towards goals of regaining former level of mobility.   Follow Up Recommendations  SNF     Equipment Recommendations  None recommended by PT    Recommendations for Other Services       Precautions / Restrictions Precautions Precautions: Fall Precaution Comments: on O2, fearful of falling again Restrictions Weight Bearing Restrictions: Yes LLE Weight Bearing: Weight bearing as tolerated Other Position/Activity Restrictions: WBAT LLE    Mobility  Bed Mobility Overal bed mobility: Needs Assistance Bed Mobility: Supine to Sit;Rolling Rolling: Max assist   Supine to sit: Max assist     General bed mobility comments: Patient required max A at both LEs and torso to stabilize in transfer.   Transfers Overall transfer level: Needs assistance Equipment used: Rolling walker (2 wheeled) Transfers: Sit to/from [facility] Sit to Stand: Max assist Stand pivot transfers: Max assist;+2 physical assistance;+2 safety/equipment       General transfer comment: two person assist to pivot in standing to chair  Ambulation/Gait             General Gait  Details: unable   Stairs             Wheelchair Mobility    Modified Rankin (Stroke Patients Only)       Balance Overall balance assessment: History of Falls;Needs assistance Sitting-balance support: Feet supported Sitting balance-Leahy Scale: Fair     Standing balance support: Bilateral upper extremity supported Standing balance-Leahy Scale: Poor                              Cognition Arousal/Alertness: Awake/alert Behavior During Therapy: Flat affect Overall Cognitive Status: Within Functional Limits for tasks assessed                                 General Comments: Pt HOH has difficult time responding unles he hears you clearly.       Exercises General Exercises - Lower Extremity Long Arc Quad: 15 reps;Both(2 sets in chair)    General Comments        Pertinent Vitals/Pain Pain Assessment: Faces Faces Pain Scale: Hurts little more Pain Location: L lateral hip Pain Descriptors / Indicators: Operative site guarding;Grimacing Pain Intervention(s): Limited activity within patient's tolerance;Monitored during session;Repositioned    Home Living                      Prior Function            PT Goals (current goals can now be found in the care plan section) Acute  Rehab PT Goals Patient Stated Goal: to feel stronger and go home Time For Goal Achievement: 12/03/18 Potential to Achieve Goals: Good Progress towards PT goals: Progressing toward goals    Frequency    BID      PT Plan Current plan remains appropriate    Co-evaluation              AM-PAC PT "6 Clicks" Mobility   Outcome Measure  Help needed turning from your back to your side while in a flat bed without using bedrails?: A Lot Help needed moving from lying on your back to sitting on the side of a flat bed without using bedrails?: A Lot Help needed moving to and from a bed to a chair (including a wheelchair)?: A Lot Help needed standing up  from a chair using your arms (e.g., wheelchair or bedside chair)?: A Lot Help needed to walk in hospital room?: Total Help needed climbing 3-5 steps with a railing? : Total 6 Click Score: 10    End of Session Equipment Utilized During Treatment: Gait belt;Oxygen Activity Tolerance: Patient tolerated treatment well;Patient limited by fatigue;Patient limited by pain Patient left: with call bell/phone within reach;with family/visitor present;in chair;with chair alarm set Nurse Communication: Mobility status PT Visit Diagnosis: Unsteadiness on feet (R26.81);Muscle weakness (generalized) (M62.81);History of falling (Z91.81);Adult, failure to thrive (R62.7)     Time: 1173-5670 PT Time Calculation (min) (ACUTE ONLY): 23 min  Charges:  $Gait Training: 23-37 mins                    Royce Macadamia PT, DPT, CSCS   11/20/2018, 11:48 AM

## 2018-11-20 NOTE — Progress Notes (Signed)
Report called to Maudie Mercury at [facility], EMS called for transportation.

## 2018-11-20 NOTE — Progress Notes (Signed)
EMS here to transport to pt

## 2018-11-20 NOTE — Clinical Social Work Note (Signed)
The CSW met with the patient and his son at bedside to discuss imminent discharge. The patient and his son agree with discharge to [facility] today via non-emergent EMS. They are aware that the transport will be after 3PM as [facility] needs to ready the room. The CSW has sent all documentation to [facility] and has delivered the discharge packet. The CSW is signing off. Please consult should needs arise.  Santiago Bumpers, MSW, Latanya Presser [phone]

## 2018-11-20 NOTE — Progress Notes (Signed)
  Subjective: 2 Days Post-Op Procedure(s) (LRB): INTRAMEDULLARY (IM) NAIL INTERTROCHANTRIC (Left) Patient reports pain as mild.   Patient is well, and has had no acute complaints or problems Current plan is for discharge to SNF. Negative for chest pain and shortness of breath Fever: no Gastrointestinal:Negative for nausea and vomiting  Objective: Vital signs in last 24 hours: Temp:  [98 F (36.7 C)-98.6 F (37 C)] 98.4 F (36.9 C) (02/09 0741) Pulse Rate:  [64-81] 66 (02/09 0741) BP: (103-116)/(53-59) 116/58 (02/09 0741) SpO2:  [93 %-100 %] 100 % (02/09 0741)  Intake/Output from previous day:  Intake/Output Summary (Last 24 hours) at 11/20/2018 0912 Last data filed at 11/20/2018 0741 Gross per 24 hour  Intake 1366.72 ml  Output 1050 ml  Net 316.72 ml    Intake/Output this shift: Total I/O In: -  Out: 650 [Urine:650]  Labs: Recent Labs    11/19/18 1451 11/20/18 0342  HGB 9.2* 8.9*   Recent Labs    11/19/18 1451 11/20/18 0342  WBC 15.9* 13.4*  RBC 3.57* 3.55*  HCT 29.2* 28.5*  PLT 231 262   Recent Labs    11/20/18 0342  NA 133*  K 3.8  CL 99  CO2 28  BUN 27*  CREATININE 1.17  GLUCOSE 99  CALCIUM 7.7*   No results for input(s): LABPT, INR in the last 72 hours.   EXAM General - Patient is Alert, Appropriate and Oriented Extremity - ABD soft Sensation intact distally Intact pulses distally Dorsiflexion/Plantar flexion intact Incision: scant drainage No cellulitis present Dressing/Incision - blood tinged drainage Motor Function - intact, moving foot and toes well on exam.  Abdomen soft with normal BS.  Past Medical History:  Diagnosis Date  . A-fib ([place]) 04/08/2014  . Aortic stenosis   . Cardiomyopathy ([place])   . Cataract   . CVA (cerebral infarction) 2003  . Dysrhythmia   . GERD (gastroesophageal reflux disease)   . Hearing loss   . Hyperlipidemia   . Hypertension 05/04/2014  . Lung mass   . Macular degeneration   . Nephrolithiasis   .  Pernicious anemia   . Pneumonia 05/2015  . Prostate cancer [facility]) 2007   performed by Dr. Eliberto Ivory  . Skin cancer   . Spinal stenosis   . Stroke [facility])     Assessment/Plan: 2 Days Post-Op Procedure(s) (LRB): INTRAMEDULLARY (IM) NAIL INTERTROCHANTRIC (Left) Active Problems:   Closed left hip fracture, initial encounter ([place])  Estimated body mass index is 22.38 kg/m as calculated from the following:   Height as of this encounter: 5\' 5"  (1.651 m).   Weight as of this encounter: 61 kg. Advance diet Up with therapy D/C IV fluids when tolerating po intake.  Labs reviewed this AM.  Hg 8.9 this AM. Up with therapy today, current plan is for d/c to SNF. Patient has had a BM.  Upon discharge, staples can be removed on 12/02/18, follow-up with [place] in 6 weeks for repeat x-rays of the left femur.  DVT Prophylaxis - Xarelto, Foot Pumps and TED hose Weight-Bearing as tolerated to left leg  J. Cameron Proud, PA-C [facility] Orthopaedic Surgery 11/20/2018, 9:12 AM

## 2018-11-20 NOTE — Discharge Summary (Signed)
[place] [facility] [place] 85 y.o., DOB 1925/03/21, MRN [id number]. Admission date: 11/16/2018 Discharge Date 11/20/2018 Primary MD Kirk Ruths, MD Admitting Physician Nicholes Mango, MD  Admission Diagnosis  Fall [W19.XXXA] Closed fracture of left hip, initial encounter ([place]) [S72.002A]  Discharge Diagnosis   Active Problems:   Closed left hip fracture, initial encounter [facility]) Chronic atrial fibrillation Hypothyroidism Hyperlipidemia GERD     Hospital Course  Matthew Hicks  is a 85 y.o. male with a known history of chronic atrial fibrillation on Xarelto, amiodarone, hypertension, hyperlipidemia, history of stroke in the past is presenting to the ED with a chief complaint of left hip pain after he sustained a fall.  Patient was seen in the emergency room when was noted to have a hip fracture.  Due to him being on Xarelto surgery was held patient underwent surgery and tolerated the procedure well.  He is very weak and deconditioned need of rehab. [facility]  He was seen by orthopedic PA Vella Kohler.  I have verified with him that it was okay to discharge patient today he stated that longus patient's family was okay he was stable from orthopedic  standpoint to be discharged.   Wean oxygen as tolerated       Consults  orthopedic surgery  Significant Tests:  See full reports for all details     Dg Chest 1 View  Result Date: 11/16/2018 CLINICAL DATA:  [place] on his left side getting out of a car today. Left hip fracture. Preoperative evaluation. EXAM: CHEST  1 VIEW COMPARISON:  Chest CT dated 08/09/2017. FINDINGS: Grossly stable enlarged cardiac silhouette and right basilar pleural fluid, pleural thickening and atelectasis. Stable mild prominence of the interstitial markings with normal vascularity. Diffuse osteopenia and thoracic spine degenerative changes. Cervical spine degenerative changes. No fracture or pneumothorax seen.  Bilateral carotid artery calcifications are noted in neck. IMPRESSION: 1. No acute findings. 2. Stable cardiomegaly, chronic interstitial lung disease and right pleural fluid and atelectasis. 3. Bilateral carotid artery atheromatous calcifications. Electronically Signed   By: Claudie Revering M.D.   On: 11/16/2018 15:19   Ct Head Wo Contrast  Result Date: 11/16/2018 CLINICAL DATA:  85 year old male status post fall with pain. EXAM: CT HEAD WITHOUT CONTRAST TECHNIQUE: Contiguous axial images were obtained from the base of the skull through the vertex without intravenous contrast. COMPARISON:  Head CT 10/07/2004. FINDINGS: Brain: Chronic cerebral white matter hypodensity has progressed since 2005, confluent in both hemispheres. Generalized cerebral volume decreased since that time. No ventriculomegaly. No midline shift, mass effect, or evidence of intracranial mass lesion. No acute intracranial hemorrhage identified. No cortically based acute infarct identified. No cortical encephalomalacia identified. The heterogeneity of the bilateral basal ganglia is new since 2005. Vascular: Calcified atherosclerosis at the skull base. No suspicious intracranial vascular hyperdensity. Skull: Stable and intact. Osteopenia. Sinuses/Orbits: Visualized paranasal sinuses and mastoids are stable and well pneumatized. Other: Postoperative changes to both globes. No scalp hematoma or acute orbit or scalp soft tissue finding identified. IMPRESSION: 1. No acute intracranial abnormality or acute traumatic injury identified. 2. Progressed chronic small vessel disease suspected since 2005. Electronically Signed   By: Genevie Ann M.D.   On: 11/16/2018 15:49   Dg Hip Operative Unilat W Or W/o Pelvis Left  Result Date: 11/18/2018 CLINICAL DATA:  Operative fixation of a left hip fracture. EXAM: OPERATIVE LEFT HIP (WITH PELVIS IF PERFORMED) 4 VIEWS TECHNIQUE: Fluoroscopic spot image(s)  were submitted for interpretation post-operatively. COMPARISON:   11/16/2018. FINDINGS: Four C-arm views of the left hip and femur demonstrate intramedullary rod and nail fixation of the previously demonstrated left intertrochanteric fracture with normal alignment. IMPRESSION: Hardware fixation of the previously demonstrated left intertrochanteric fracture with normal alignment. Electronically Signed   By: Claudie Revering M.D.   On: 11/18/2018 19:38   Dg Hip Unilat With Pelvis 2-3 Views Left  Result Date: 11/16/2018 CLINICAL DATA:  Left hip pain following a fall getting out of a car today. EXAM: DG HIP (WITH OR WITHOUT PELVIS) 2-3V LEFT COMPARISON:  None. FINDINGS: Comminuted left intertrochanteric fracture with varus angulation. No significant displacement of the major fragments. Diffuse osteopenia. Prostate radiation seed implants. Suprapubic Foley catheter. Lower lumbar spine degenerative changes and scoliosis. IMPRESSION: Comminuted left intertrochanteric fracture with varus angulation. Electronically Signed   By: Claudie Revering M.D.   On: 11/16/2018 15:20   Dg Femur Port Min 2 Views Left  Result Date: 11/18/2018 CLINICAL DATA:  Post LEFT hip ORIF EXAM: LEFT FEMUR PORTABLE 2 VIEWS COMPARISON:  Portable exam 2021 hours compared to 11/16/2018 FINDINGS: Placement of an IM nail and dynamic screw at the proximal LEFT femur post ORIF of a reduced intertrochanteric fracture. Bones appear demineralized. Brachytherapy seed implants at prostate bed. No additional fracture, dislocation, or bone destruction. Scattered atherosclerotic calcifications. IMPRESSION: Post ORIF of intertrochanteric fracture LEFT femur. Electronically Signed   By: Lavonia Dana M.D.   On: 11/18/2018 20:50       Today   Subjective:   Matthew Hicks patient currently comfortable denies any complaints  Objective:   Blood pressure (!) 116/58, pulse 66, temperature 98.4 F (36.9 C), temperature source Oral, resp. rate 18, height 5\' 5"  (1.651 m), weight 61 kg, SpO2 100 %.  .  Intake/Output Summary (Last 24  hours) at 11/20/2018 1133 Last data filed at 11/20/2018 1028 Gross per 24 hour  Intake 1246.72 ml  Output 1050 ml  Net 196.72 ml    Exam VITAL SIGNS: Blood pressure (!) 116/58, pulse 66, temperature 98.4 F (36.9 C), temperature source Oral, resp. rate 18, height 5\' 5"  (1.651 m), weight 61 kg, SpO2 100 %.  GENERAL:  85 y.o.-year-old patient lying in the bed with no acute distress.  EYES: Pupils equal, round, reactive to light and accommodation. No scleral icterus. Extraocular muscles intact.  HEENT: Head atraumatic, normocephalic. Oropharynx and nasopharynx clear.  NECK:  Supple, no jugular venous distention. No thyroid enlargement, no tenderness.  LUNGS: Normal breath sounds bilaterally, no wheezing, rales,rhonchi or crepitation. No use of accessory muscles of respiration.  CARDIOVASCULAR: S1, S2 normal. No murmurs, rubs, or gallops.  ABDOMEN: Soft, nontender, nondistended. Bowel sounds present. No organomegaly or mass.  EXTREMITIES: No pedal edema, cyanosis, or clubbing.  NEUROLOGIC: Cranial nerves II through XII are intact. Muscle strength 5/5 in all extremities. Sensation intact. Gait not checked.  PSYCHIATRIC: The patient is alert and oriented x 3.  SKIN: No obvious rash, lesion, or ulcer.   Data Review     CBC w Diff:  Lab Results  Component Value Date   WBC 13.4 (H) 11/20/2018   HGB 8.9 (L) 11/20/2018   HCT 28.5 (L) 11/20/2018   PLT 262 11/20/2018   LYMPHOPCT 4 11/19/2018   MONOPCT 9 11/19/2018   EOSPCT 1 11/19/2018   BASOPCT 0 11/19/2018   CMP:  Lab Results  Component Value Date   NA 133 (L) 11/20/2018   K 3.8 11/20/2018   CL 99 11/20/2018  CO2 28 11/20/2018   BUN 27 (H) 11/20/2018   CREATININE 1.17 11/20/2018   PROT 5.7 (L) 11/17/2018   ALBUMIN 2.9 (L) 11/17/2018   BILITOT 0.7 11/17/2018   ALKPHOS 70 11/17/2018   AST 20 11/17/2018   ALT 20 11/17/2018  .  Micro Results Recent Results (from the past 240 hour(s))  Surgical PCR screen     Status: Abnormal    Collection Time: 11/17/18  1:23 PM  Result Value Ref Range Status   MRSA, PCR NEGATIVE NEGATIVE Final   Staphylococcus aureus POSITIVE (A) NEGATIVE Final    Comment: (NOTE) The Xpert SA Assay (FDA approved for NASAL specimens in patients 26 years of age and older), is one component of a comprehensive surveillance program. It is not intended to diagnose infection nor to guide or monitor treatment. Performed at [facility], [address] [place], [place] [address]         Code Status Orders  (From admission, onward)         Start     Ordered   11/16/18 1740  Full code  Continuous     11/16/18 1739        Code Status History    Date Active Date Inactive Code Status Order ID Comments User Context   05/23/2017 2213 05/27/2017 1734 Full Code [id number]  Henreitta Leber, MD Inpatient    Advance Directive Documentation     Most Recent Value  Type of Advance Directive  Healthcare Power of Attorney, Living will  Pre-existing out of facility DNR order (yellow form or pink MOST form)  -  "MOST" Form in Place?  -           Contact information for follow-up providers    Hooten, Laurice Record, MD Follow up in 2 week(s).   Specialty:  Orthopedic Surgery Contact information: [place] [address] [phone]            Contact information for after-discharge care    Destination    [facility] Preferred SNF .   Service:  Skilled Nursing Contact information: [address] [place] [phone]                  Discharge Medications   Allergies as of 11/20/2018      Reactions   Amoxicillin-pot Clavulanate Swelling   Angioedema Has patient had a PCN reaction causing immediate rash, facial/tongue/throat swelling, SOB or lightheadedness with hypotension: Yes Has patient had a PCN reaction causing severe rash involving mucus membranes or skin necrosis: No Has patient had  a PCN reaction that required hospitalization: No Has patient had a PCN reaction occurring within the last 10 years: Yes If all of the above answers are "NO", then may proceed with Cephalosporin use.   Fesoterodine Fumarate Er Hives, Rash   Sulfa Antibiotics Rash      Medication List    STOP taking these medications   acetaminophen-codeine 300-30 MG tablet Commonly known as:  TYLENOL #3   ranitidine 300 MG capsule Commonly known as:  ZANTAC     TAKE these medications   amiodarone 100 MG tablet Commonly known as:  PACERONE Take 100 mg by mouth daily.   atorvastatin 20 MG tablet Commonly known as:  LIPITOR Take 20 mg by mouth daily.   azelastine 0.1 % nasal spray Commonly known as:  ASTELIN Place 2 sprays into the nose daily as needed for rhinitis or allergies.  B-12 5000 MCG Caps Take 5,000 mcg by mouth daily. What changed:  Another medication with the same name was removed. Continue taking this medication, and follow the directions you see here.   docusate sodium 100 MG capsule Commonly known as:  COLACE Take 100 mg by mouth 2 (two) times daily.   furosemide 20 MG tablet Commonly known as:  LASIX Take 20 mg by mouth daily.   ICAPS Caps Take 1 tablet by mouth daily.   levothyroxine 50 MCG tablet Commonly known as:  SYNTHROID, LEVOTHROID Take 50 mcg by mouth daily.   oxyCODONE 5 MG immediate release tablet Commonly known as:  Oxy IR/ROXICODONE Take 1 tablet (5 mg total) by mouth every 4 (four) hours as needed for moderate pain (pain score 4-6).   PEG 3350 Powd Take 17 g by mouth daily.   XARELTO 10 MG Tabs tablet Generic drug:  rivaroxaban TAKE 1 TABLET(10 MG TOTAL) BY MOUTH DAILY WITH DINNER.          Total Time in preparing paper work, data evaluation and todays exam - 57 minutes  Dustin Flock M.D on 11/20/2018 at 11:33 AM [facility]   Office  [phone]

## 2018-11-21 ENCOUNTER — Encounter: Payer: Self-pay | Admitting: Orthopedic Surgery

## 2018-11-22 LAB — TYPE AND SCREEN
ABO/RH(D): O POS
Antibody Screen: POSITIVE
Unit division: 0
Unit division: 0

## 2018-11-22 LAB — BPAM RBC
BLOOD PRODUCT EXPIRATION DATE: 202002292359
Blood Product Expiration Date: 202003062359
Unit Type and Rh: 5100
Unit Type and Rh: 5100

## 2018-11-24 NOTE — Anesthesia Postprocedure Evaluation (Signed)
Anesthesia Post Note  Patient: Hassel Uphoff.  Procedure(s) Performed: INTRAMEDULLARY (IM) NAIL INTERTROCHANTRIC (Left Hip)  Patient location during evaluation: PACU Anesthesia Type: Spinal Level of consciousness: awake and alert Pain management: pain level controlled Vital Signs Assessment: post-procedure vital signs reviewed and stable Respiratory status: spontaneous breathing, nonlabored ventilation, respiratory function stable and patient connected to nasal cannula oxygen Cardiovascular status: blood pressure returned to baseline and stable Postop Assessment: no apparent nausea or vomiting Anesthetic complications: no     Last Vitals:  Vitals:   11/20/18 0741 11/20/18 1554  BP: (!) 116/58 (!) 117/58  Pulse: 66 71  Resp:    Temp: 36.9 C 36.7 C  SpO2: 100% 100%    Last Pain:  Vitals:   11/20/18 1554  TempSrc: Oral  PainSc: 0-No pain                 Molli Barrows

## 2018-11-29 ENCOUNTER — Emergency Department
Admission: EM | Admit: 2018-11-29 | Discharge: 2018-11-29 | Disposition: A | Discharge status: Home / Self Care | Payer: Medicare Other | Source: Home / Self Care | Attending: Emergency Medicine | Admitting: Emergency Medicine

## 2018-11-29 ENCOUNTER — Other Ambulatory Visit: Payer: Self-pay

## 2018-11-29 ENCOUNTER — Encounter: Payer: Self-pay | Admitting: Emergency Medicine

## 2018-11-29 DIAGNOSIS — Z87891 Personal history of nicotine dependence: Secondary | ICD-10-CM | POA: Insufficient documentation

## 2018-11-29 DIAGNOSIS — Z85828 Personal history of other malignant neoplasm of skin: Secondary | ICD-10-CM

## 2018-11-29 DIAGNOSIS — Z8546 Personal history of malignant neoplasm of prostate: Secondary | ICD-10-CM

## 2018-11-29 DIAGNOSIS — Y828 Other medical devices associated with adverse incidents: Secondary | ICD-10-CM

## 2018-11-29 DIAGNOSIS — T83091A Other mechanical complication of indwelling urethral catheter, initial encounter: Secondary | ICD-10-CM

## 2018-11-29 DIAGNOSIS — Z8673 Personal history of transient ischemic attack (TIA), and cerebral infarction without residual deficits: Secondary | ICD-10-CM

## 2018-11-29 DIAGNOSIS — Z9359 Other cystostomy status: Secondary | ICD-10-CM

## 2018-11-29 DIAGNOSIS — A0472 Enterocolitis due to Clostridium difficile, not specified as recurrent: Secondary | ICD-10-CM | POA: Diagnosis not present

## 2018-11-29 DIAGNOSIS — Z7901 Long term (current) use of anticoagulants: Secondary | ICD-10-CM

## 2018-11-29 DIAGNOSIS — Z79899 Other long term (current) drug therapy: Secondary | ICD-10-CM

## 2018-11-29 DIAGNOSIS — I10 Essential (primary) hypertension: Secondary | ICD-10-CM | POA: Insufficient documentation

## 2018-11-29 DIAGNOSIS — E871 Hypo-osmolality and hyponatremia: Secondary | ICD-10-CM | POA: Diagnosis not present

## 2018-11-29 NOTE — ED Notes (Signed)
Per family with patient, patient was instructed by Dr. Rogers Blocker to meet him here to have suprapubic catheter replaced.

## 2018-11-29 NOTE — ED Notes (Signed)
Patient's suprapubic catheter replaced by Dr. Corky Downs. Drainage bag and securement device changed. Patient tolerated well.

## 2018-11-29 NOTE — ED Triage Notes (Signed)
[facility].  Needs suprapubic cath change as it has been 30 days.  They were unable to change yesterday, so he is sent here to meet dr Rogers Blocker to change it.

## 2018-11-29 NOTE — ED Provider Notes (Signed)
[facility] Emergency Department Provider Note   ____________________________________________    I have reviewed the triage vital signs and the nursing notes.   HISTORY  Chief Complaint Urinary Retention     HPI Matthew Hicks. is a 85 y.o. male with a history of atrial fibrillation on Xarelto with suprapubic catheter apparently sent to the emergency department to have suprapubic catheter changed.  Catheter is working appropriately however it is been greater than 30 days since last change [facility] to be seen by Dr. Eliberto Ivory of urology.  Otherwise patient feels well and has no complaints.  No fevers or chills  Past Medical History:  Diagnosis Date  . A-fib ([place]) 04/08/2014  . Aortic stenosis   . Cardiomyopathy ([place])   . Cataract   . CVA (cerebral infarction) 2003  . Dysrhythmia   . GERD (gastroesophageal reflux disease)   . Hearing loss   . Hyperlipidemia   . Hypertension 05/04/2014  . Lung mass   . Macular degeneration   . Nephrolithiasis   . Pernicious anemia   . Pneumonia 05/2015  . Prostate cancer [facility]) 2007   performed by Dr. Eliberto Ivory  . Skin cancer   . Spinal stenosis   . Stroke [facility])     Patient Active Problem List   Diagnosis Date Noted  . Closed left hip fracture, initial encounter ([place]) 11/16/2018  . Adrenal nodule ([place]) 07/15/2017  . Kidney stone on left side 05/23/2017  . Benign paroxysmal positional vertigo due to bilateral vestibular disorder 01/07/2017  . Pure hypercholesterolemia 05/13/2016  . History of stroke 11/14/2015  . Cerebral vascular accident ([place]) 07/02/2014  . BP (high blood pressure) 05/04/2014  . A-fib ([place]) 04/08/2014  . Benign hypertension 04/08/2014  . Aortic heart valve narrowing 04/08/2014  . Cardiomyopathy ([place]) 04/08/2014  . Acid reflux 04/08/2014  . HLD (hyperlipidemia) 04/08/2014  . PA (pernicious anemia) 04/08/2014  . Spinal stenosis 04/08/2014    Past Surgical History:  Procedure  Laterality Date  . CATARACT EXTRACTION    . CIRCUMCISION  1994  . CYSTOSCOPY  1946  . CYSTOSCOPY N/A 08/10/2018   Procedure: CYSTOSCOPY, superpubic catheter placement;  Surgeon: Royston Cowper, MD;  Location: [facility] ORS;  Service: Urology;  Laterality: N/A;  . CYSTOSCOPY W/ URETERAL STENT REMOVAL Left 06/15/2017   Procedure: CYSTOSCOPY WITH STENT REMOVAL, RETROGRADE, AND HOLMIUM LASER;  Surgeon: Royston Cowper, MD;  Location: [facility] ORS;  Service: Urology;  Laterality: Left;  . CYSTOSCOPY WITH STENT PLACEMENT Left 05/23/2017   Procedure: CYSTOSCOPY WITH STENT PLACEMENT;  Surgeon: Heron Sabins, MD;  Location: [facility] ORS;  Service: Urology;  Laterality: Left;  . INTRAMEDULLARY (IM) NAIL INTERTROCHANTERIC Left 11/18/2018   Procedure: INTRAMEDULLARY (IM) NAIL INTERTROCHANTRIC;  Surgeon: Dereck Leep, MD;  Location: [facility] ORS;  Service: Orthopedics;  Laterality: Left;  . laparoscopic prostectomy    . Spinal cyst removal    . TRANSURETHRAL RESECTION OF PROSTATE  2007   prostate cancer    Prior to Admission medications   Medication Sig Start Date End Date Taking? Authorizing Provider  amiodarone (PACERONE) 100 MG tablet Take 100 mg by mouth daily.    [provider]  atorvastatin (LIPITOR) 20 MG tablet Take 20 mg by mouth daily.    [provider]  azelastine (ASTELIN) 0.1 % nasal spray Place 2 sprays into the nose daily as needed for rhinitis or allergies.  11/14/15 06/07/17  [provider]  Cyanocobalamin (B-12) 5000 MCG CAPS Take  5,000 mcg by mouth daily.     [provider]  docusate sodium (COLACE) 100 MG capsule Take 100 mg by mouth 2 (two) times daily.    [provider]  furosemide (LASIX) 20 MG tablet Take 20 mg by mouth daily.  09/11/15   [provider]  levothyroxine (SYNTHROID, LEVOTHROID) 50 MCG tablet Take 50 mcg by mouth daily. 10/03/18 10/03/19  [provider]  Multiple Vitamins-Minerals (ICAPS) CAPS Take 1 tablet by  mouth daily.    [provider]  oxyCODONE (OXY IR/ROXICODONE) 5 MG immediate release tablet Take 1 tablet (5 mg total) by mouth every 4 (four) hours as needed for moderate pain (pain score 4-6). 11/20/18   Dustin Flock, MD  Polyethylene Glycol 3350 (PEG 3350) POWD Take 17 g by mouth daily.    [provider]  rivaroxaban (XARELTO) 10 MG TABS tablet TAKE 1 TABLET(10 MG TOTAL) BY MOUTH DAILY WITH DINNER. 08/26/15   [provider]     Allergies Amoxicillin-pot clavulanate; Fesoterodine fumarate er; and Sulfa antibiotics  Family History  Problem Relation Age of Onset  . Stroke Father        heart disease  . Pneumonia Father   . Alzheimer's disease Brother   . Colon cancer Daughter 45  . Cancer - Other Paternal Aunt 13       "GYN cancer"  . Cancer - Other Paternal Grandmother 75       "GYN cancer"    Social History Social History   Tobacco Use  . Smoking status: Former Smoker    Packs/day: 1.00    Years: 40.00    Pack years: 40.00    Types: Cigarettes    Last attempt to quit: 06/08/1977    Years since quitting: 41.5  . Smokeless tobacco: Never Used  . Tobacco comment: does not smoke now  Substance Use Topics  . Alcohol use: No    Alcohol/week: 0.0 standard drinks  . Drug use: No    Review of Systems  Constitutional: No fever/chills   Cardiovascular: Denies chest pain. Respiratory: Denies shortness of breath. Gastrointestinal: No abdominal pain Genitourinary: Suprapubic catheter  Skin: Negative for rash.    ____________________________________________   PHYSICAL EXAM:  VITAL SIGNS: ED Triage Vitals  Enc Vitals Group     BP 11/29/18 1249 (!) 104/52     Pulse Rate 11/29/18 1249 82     Resp 11/29/18 1249 16     Temp 11/29/18 1246 98.3 F (36.8 C)     Temp Source 11/29/18 1246 Oral     SpO2 11/29/18 1249 100 %     Weight 11/29/18 1246 61 kg (134 lb 7.7 oz)     Height 11/29/18 1246 1.651 m (5\' 5" )     Head Circumference --       [facility] --      Pain Score 11/29/18 1245 0     Pain Loc --      Pain Edu? --      Excl. in [place]? --     Constitutional: Alert     Mouth/Throat: Mucous membranes are moist.    Cardiovascular: Normal rate, regular rhythm. Grossly normal heart sounds.  Good peripheral circulation. Respiratory: Normal respiratory effort.  No retractions. Lungs CTAB. Gastrointestinal: Soft and nontender. No distention.  No CVA tenderness.  Suprapubic catheter noted, insertion site clean dry and intact, no rash, yellow urine in catheter, no obstruction  Musculoskeletal:  Warm and well perfused Neurologic:  Normal speech and language.  Skin:  Skin is warm, dry and intact. No rash noted.   ____________________________________________   LABS (all labs ordered are listed, but only abnormal results are displayed)  Labs Reviewed - No data to display ____________________________________________  EKG  None______________________________________  RADIOLOGY  None ____________________________________________   PROCEDURES  Procedure(s) performed: yes  Suprapubic catheter exchanged  Consent obtained verbally Bladder filled with sterile water Catheter balloon deflated and catheter removed Cleaned insertion site Inserted 18 Fr catheter without difficulty  Procedures   Critical Care performed: No ____________________________________________   INITIAL IMPRESSION / ASSESSMENT AND PLAN / ED COURSE  Pertinent labs & imaging results that were available during my care of the patient were reviewed by me and considered in my medical decision making (see chart for details).  Will attempt to contact Dr. Eliberto Ivory of urology  Dr. Eliberto Ivory has requested that I replace the catheter, I did so as detailed above.  Patient tolerated well    ____________________________________________   FINAL CLINICAL IMPRESSION(S) / ED DIAGNOSES  Final diagnoses:  Complication of Foley catheter, initial encounter [facility])         Note:  This document was prepared using Dragon voice recognition software and may include unintentional dictation errors.   Lavonia Drafts, MD 11/29/18 [phone]

## 2018-11-29 NOTE — ED Notes (Signed)
Pt had defecated in brief. Pt cleaned with warm wipes, able to roll side to side. New brief placed on pt. Pt moved up in bed.

## 2018-11-30 ENCOUNTER — Other Ambulatory Visit: Payer: Self-pay

## 2018-11-30 ENCOUNTER — Inpatient Hospital Stay
Admission: EM | Admit: 2018-11-30 | Discharge: 2018-12-07 | DRG: 371 | Disposition: A | Discharge status: Skilled Nursing Facility | Payer: Medicare Other | Source: Skilled Nursing Facility | Attending: Internal Medicine | Admitting: Internal Medicine

## 2018-11-30 ENCOUNTER — Emergency Department: Payer: Medicare Other

## 2018-11-30 ENCOUNTER — Encounter: Payer: Self-pay | Admitting: *Deleted

## 2018-11-30 DIAGNOSIS — I350 Nonrheumatic aortic (valve) stenosis: Secondary | ICD-10-CM | POA: Diagnosis present

## 2018-11-30 DIAGNOSIS — L89152 Pressure ulcer of sacral region, stage 2: Secondary | ICD-10-CM | POA: Diagnosis present

## 2018-11-30 DIAGNOSIS — Z87442 Personal history of urinary calculi: Secondary | ICD-10-CM

## 2018-11-30 DIAGNOSIS — D509 Iron deficiency anemia, unspecified: Secondary | ICD-10-CM | POA: Diagnosis present

## 2018-11-30 DIAGNOSIS — Z8673 Personal history of transient ischemic attack (TIA), and cerebral infarction without residual deficits: Secondary | ICD-10-CM

## 2018-11-30 DIAGNOSIS — I482 Chronic atrial fibrillation, unspecified: Secondary | ICD-10-CM | POA: Diagnosis present

## 2018-11-30 DIAGNOSIS — D473 Essential (hemorrhagic) thrombocythemia: Secondary | ICD-10-CM | POA: Diagnosis not present

## 2018-11-30 DIAGNOSIS — Z823 Family history of stroke: Secondary | ICD-10-CM

## 2018-11-30 DIAGNOSIS — Z888 Allergy status to other drugs, medicaments and biological substances status: Secondary | ICD-10-CM | POA: Diagnosis not present

## 2018-11-30 DIAGNOSIS — K219 Gastro-esophageal reflux disease without esophagitis: Secondary | ICD-10-CM | POA: Diagnosis present

## 2018-11-30 DIAGNOSIS — I959 Hypotension, unspecified: Secondary | ICD-10-CM | POA: Diagnosis present

## 2018-11-30 DIAGNOSIS — E039 Hypothyroidism, unspecified: Secondary | ICD-10-CM | POA: Diagnosis present

## 2018-11-30 DIAGNOSIS — I130 Hypertensive heart and chronic kidney disease with heart failure and stage 1 through stage 4 chronic kidney disease, or unspecified chronic kidney disease: Secondary | ICD-10-CM | POA: Diagnosis present

## 2018-11-30 DIAGNOSIS — R109 Unspecified abdominal pain: Secondary | ICD-10-CM | POA: Diagnosis not present

## 2018-11-30 DIAGNOSIS — N170 Acute kidney failure with tubular necrosis: Secondary | ICD-10-CM | POA: Diagnosis not present

## 2018-11-30 DIAGNOSIS — E278 Other specified disorders of adrenal gland: Secondary | ICD-10-CM | POA: Diagnosis present

## 2018-11-30 DIAGNOSIS — J9811 Atelectasis: Secondary | ICD-10-CM | POA: Diagnosis present

## 2018-11-30 DIAGNOSIS — Z79899 Other long term (current) drug therapy: Secondary | ICD-10-CM

## 2018-11-30 DIAGNOSIS — E871 Hypo-osmolality and hyponatremia: Secondary | ICD-10-CM | POA: Diagnosis present

## 2018-11-30 DIAGNOSIS — T83518A Infection and inflammatory reaction due to other urinary catheter, initial encounter: Secondary | ICD-10-CM | POA: Diagnosis present

## 2018-11-30 DIAGNOSIS — Z79891 Long term (current) use of opiate analgesic: Secondary | ICD-10-CM

## 2018-11-30 DIAGNOSIS — I5022 Chronic systolic (congestive) heart failure: Secondary | ICD-10-CM | POA: Diagnosis present

## 2018-11-30 DIAGNOSIS — E860 Dehydration: Secondary | ICD-10-CM | POA: Diagnosis present

## 2018-11-30 DIAGNOSIS — X58XXXD Exposure to other specified factors, subsequent encounter: Secondary | ICD-10-CM | POA: Diagnosis present

## 2018-11-30 DIAGNOSIS — L8990 Pressure ulcer of unspecified site, unspecified stage: Secondary | ICD-10-CM

## 2018-11-30 DIAGNOSIS — N179 Acute kidney failure, unspecified: Secondary | ICD-10-CM

## 2018-11-30 DIAGNOSIS — E872 Acidosis: Secondary | ICD-10-CM | POA: Diagnosis present

## 2018-11-30 DIAGNOSIS — I429 Cardiomyopathy, unspecified: Secondary | ICD-10-CM | POA: Diagnosis present

## 2018-11-30 DIAGNOSIS — Z87891 Personal history of nicotine dependence: Secondary | ICD-10-CM

## 2018-11-30 DIAGNOSIS — A419 Sepsis, unspecified organism: Secondary | ICD-10-CM

## 2018-11-30 DIAGNOSIS — Z8546 Personal history of malignant neoplasm of prostate: Secondary | ICD-10-CM

## 2018-11-30 DIAGNOSIS — M7989 Other specified soft tissue disorders: Secondary | ICD-10-CM

## 2018-11-30 DIAGNOSIS — Z820 Family history of epilepsy and other diseases of the nervous system: Secondary | ICD-10-CM

## 2018-11-30 DIAGNOSIS — Z9359 Other cystostomy status: Secondary | ICD-10-CM

## 2018-11-30 DIAGNOSIS — A0472 Enterocolitis due to Clostridium difficile, not specified as recurrent: Principal | ICD-10-CM | POA: Diagnosis present

## 2018-11-30 DIAGNOSIS — N3000 Acute cystitis without hematuria: Secondary | ICD-10-CM | POA: Diagnosis present

## 2018-11-30 DIAGNOSIS — I509 Heart failure, unspecified: Secondary | ICD-10-CM

## 2018-11-30 DIAGNOSIS — S72142D Displaced intertrochanteric fracture of left femur, subsequent encounter for closed fracture with routine healing: Secondary | ICD-10-CM

## 2018-11-30 DIAGNOSIS — Z6822 Body mass index (BMI) 22.0-22.9, adult: Secondary | ICD-10-CM

## 2018-11-30 DIAGNOSIS — Z85828 Personal history of other malignant neoplasm of skin: Secondary | ICD-10-CM

## 2018-11-30 DIAGNOSIS — D72829 Elevated white blood cell count, unspecified: Secondary | ICD-10-CM | POA: Diagnosis not present

## 2018-11-30 DIAGNOSIS — E861 Hypovolemia: Secondary | ICD-10-CM | POA: Diagnosis present

## 2018-11-30 DIAGNOSIS — Z66 Do not resuscitate: Secondary | ICD-10-CM | POA: Diagnosis present

## 2018-11-30 DIAGNOSIS — B964 Proteus (mirabilis) (morganii) as the cause of diseases classified elsewhere: Secondary | ICD-10-CM | POA: Diagnosis present

## 2018-11-30 DIAGNOSIS — Z79890 Hormone replacement therapy (postmenopausal): Secondary | ICD-10-CM

## 2018-11-30 DIAGNOSIS — Z7901 Long term (current) use of anticoagulants: Secondary | ICD-10-CM

## 2018-11-30 DIAGNOSIS — K529 Noninfective gastroenteritis and colitis, unspecified: Secondary | ICD-10-CM

## 2018-11-30 DIAGNOSIS — R509 Fever, unspecified: Secondary | ICD-10-CM | POA: Diagnosis not present

## 2018-11-30 DIAGNOSIS — Z800 Family history of malignant neoplasm of digestive organs: Secondary | ICD-10-CM

## 2018-11-30 DIAGNOSIS — I248 Other forms of acute ischemic heart disease: Secondary | ICD-10-CM | POA: Diagnosis present

## 2018-11-30 DIAGNOSIS — N289 Disorder of kidney and ureter, unspecified: Secondary | ICD-10-CM

## 2018-11-30 DIAGNOSIS — E43 Unspecified severe protein-calorie malnutrition: Secondary | ICD-10-CM | POA: Diagnosis present

## 2018-11-30 DIAGNOSIS — Z880 Allergy status to penicillin: Secondary | ICD-10-CM

## 2018-11-30 DIAGNOSIS — Z882 Allergy status to sulfonamides status: Secondary | ICD-10-CM

## 2018-11-30 DIAGNOSIS — D631 Anemia in chronic kidney disease: Secondary | ICD-10-CM | POA: Diagnosis present

## 2018-11-30 DIAGNOSIS — N183 Chronic kidney disease, stage 3 (moderate): Secondary | ICD-10-CM | POA: Diagnosis present

## 2018-11-30 DIAGNOSIS — R7989 Other specified abnormal findings of blood chemistry: Secondary | ICD-10-CM

## 2018-11-30 DIAGNOSIS — E785 Hyperlipidemia, unspecified: Secondary | ICD-10-CM | POA: Diagnosis present

## 2018-11-30 DIAGNOSIS — Z7401 Bed confinement status: Secondary | ICD-10-CM

## 2018-11-30 DIAGNOSIS — R778 Other specified abnormalities of plasma proteins: Secondary | ICD-10-CM

## 2018-11-30 DIAGNOSIS — H9190 Unspecified hearing loss, unspecified ear: Secondary | ICD-10-CM | POA: Diagnosis present

## 2018-11-30 DIAGNOSIS — Y846 Urinary catheterization as the cause of abnormal reaction of the patient, or of later complication, without mention of misadventure at the time of the procedure: Secondary | ICD-10-CM | POA: Diagnosis present

## 2018-11-30 LAB — BASIC METABOLIC PANEL
Anion gap: 14 (ref 5–15)
BUN: 32 mg/dL — ABNORMAL HIGH (ref 8–23)
CO2: 21 mmol/L — ABNORMAL LOW (ref 22–32)
CREATININE: 1.58 mg/dL — AB (ref 0.61–1.24)
Calcium: 7.7 mg/dL — ABNORMAL LOW (ref 8.9–10.3)
Chloride: 93 mmol/L — ABNORMAL LOW (ref 98–111)
GFR calc Af Amer: 44 mL/min — ABNORMAL LOW (ref >60)
GFR, EST NON AFRICAN AMERICAN: 38 mL/min — AB (ref >60)
Glucose, Bld: 105 mg/dL — ABNORMAL HIGH (ref 70–99)
Potassium: 4 mmol/L (ref 3.5–5.1)
Sodium: 128 mmol/L — ABNORMAL LOW (ref 135–145)

## 2018-11-30 LAB — CBC
HCT: 35.2 % — ABNORMAL LOW (ref 39.0–52.0)
Hemoglobin: 11.3 g/dL — ABNORMAL LOW (ref 13.0–17.0)
MCH: 25.5 pg — ABNORMAL LOW (ref 26.0–34.0)
MCHC: 32.1 g/dL (ref 30.0–36.0)
MCV: 79.3 fL — ABNORMAL LOW (ref 80.0–100.0)
Platelets: 742 10*3/uL — ABNORMAL HIGH (ref 150–400)
RBC: 4.44 MIL/uL (ref 4.22–5.81)
RDW: 16.9 % — ABNORMAL HIGH (ref 11.5–15.5)
WBC: 44.4 10*3/uL — ABNORMAL HIGH (ref 4.0–10.5)
nRBC: 0.1 % (ref 0.0–0.2)

## 2018-11-30 LAB — URINALYSIS, COMPLETE (UACMP) WITH MICROSCOPIC
Bilirubin Urine: NEGATIVE
GLUCOSE, UA: NEGATIVE mg/dL
Ketones, ur: NEGATIVE mg/dL
Nitrite: NEGATIVE
Protein, ur: 30 mg/dL — AB
Specific Gravity, Urine: 1.017 (ref 1.005–1.030)
WBC, UA: >50 WBC/hpf — ABNORMAL HIGH (ref 0–5)
pH: 6 (ref 5.0–8.0)

## 2018-11-30 LAB — HEPATIC FUNCTION PANEL
ALK PHOS: 119 U/L (ref 38–126)
ALT: 47 U/L — ABNORMAL HIGH (ref 0–44)
AST: 39 U/L (ref 15–41)
Albumin: 2.4 g/dL — ABNORMAL LOW (ref 3.5–5.0)
Bilirubin, Direct: 0.8 mg/dL — ABNORMAL HIGH (ref 0.0–0.2)
Indirect Bilirubin: 1.2 mg/dL — ABNORMAL HIGH (ref 0.3–0.9)
Total Bilirubin: 2 mg/dL — ABNORMAL HIGH (ref 0.3–1.2)
Total Protein: 5.3 g/dL — ABNORMAL LOW (ref 6.5–8.1)

## 2018-11-30 LAB — INFLUENZA PANEL BY PCR (TYPE A & B)
INFLAPCR: NEGATIVE
INFLBPCR: NEGATIVE

## 2018-11-30 LAB — LACTIC ACID, PLASMA
Lactic Acid, Venous: 2.6 mmol/L (ref 0.5–1.9)
Lactic Acid, Venous: 2.1 mmol/L (ref 0.5–1.9)

## 2018-11-30 LAB — TROPONIN I: Troponin I: 0.09 ng/mL (ref <0.03)

## 2018-11-30 LAB — BRAIN NATRIURETIC PEPTIDE: B Natriuretic Peptide: 340 pg/mL — ABNORMAL HIGH (ref 0.0–100.0)

## 2018-11-30 LAB — LIPASE, BLOOD: Lipase: 18 U/L (ref 11–51)

## 2018-11-30 MED ORDER — ACETAMINOPHEN 325 MG PO TABS: 650.0000 mg | ORAL_TABLET | Freq: Three times a day (TID) | ORAL | Status: DC | PRN

## 2018-11-30 MED ORDER — METRONIDAZOLE IN NACL 5-0.79 MG/ML-% IV SOLN
500.0000 mg | Freq: Once | INTRAVENOUS | Status: AC
  Administered 2018-11-30: 500 mg via INTRAVENOUS
  Filled 2018-11-30: qty 100

## 2018-11-30 MED ORDER — SODIUM CHLORIDE 0.9 % IV SOLN: INTRAVENOUS | Status: DC

## 2018-11-30 MED ORDER — LEVOTHYROXINE SODIUM 50 MCG PO TABS
50.0000 ug | ORAL_TABLET | Freq: Every day | ORAL | Status: DC
  Administered 2018-12-01: 50 ug via ORAL
  Filled 2018-11-30: qty 1

## 2018-11-30 MED ORDER — SODIUM CHLORIDE 0.9 % IV BOLUS
500.0000 mL | Freq: Once | INTRAVENOUS | Status: AC
  Administered 2018-11-30: 500 mL via INTRAVENOUS

## 2018-11-30 MED ORDER — TRAMADOL HCL 50 MG PO TABS
50.0000 mg | ORAL_TABLET | Freq: Four times a day (QID) | ORAL | Status: DC | PRN
  Administered 2018-12-01 – 2018-12-07 (×3): 50 mg via ORAL
  Filled 2018-11-30 (×3): qty 1

## 2018-11-30 MED ORDER — ONDANSETRON HCL 4 MG PO TABS: 4.0000 mg | ORAL_TABLET | Freq: Four times a day (QID) | ORAL | Status: DC | PRN

## 2018-11-30 MED ORDER — RIVAROXABAN 15 MG PO TABS
15.0000 mg | ORAL_TABLET | Freq: Every day | ORAL | Status: DC
  Administered 2018-11-30 – 2018-12-02 (×3): 15 mg via ORAL
  Filled 2018-11-30 (×4): qty 1

## 2018-11-30 MED ORDER — ONDANSETRON HCL 4 MG/2ML IJ SOLN: 4.0000 mg | Freq: Four times a day (QID) | INTRAMUSCULAR | Status: DC | PRN

## 2018-11-30 MED ORDER — SODIUM CHLORIDE 0.9 % IV SOLN
INTRAVENOUS | Status: AC
  Administered 2018-11-30 – 2018-12-01 (×2): via INTRAVENOUS

## 2018-11-30 MED ORDER — AZELASTINE HCL 0.1 % NA SOLN
2.0000 | Freq: Every day | NASAL | Status: DC | PRN
  Filled 2018-11-30: qty 30

## 2018-11-30 MED ORDER — ACETAMINOPHEN 325 MG PO TABS
650.0000 mg | ORAL_TABLET | Freq: Once | ORAL | Status: AC
  Administered 2018-11-30: 650 mg via ORAL
  Filled 2018-11-30: qty 2

## 2018-11-30 MED ORDER — OXYCODONE HCL 5 MG PO TABS: 5.0000 mg | ORAL_TABLET | ORAL | Status: DC | PRN

## 2018-11-30 MED ORDER — ONDANSETRON HCL 4 MG/2ML IJ SOLN
4.0000 mg | Freq: Once | INTRAMUSCULAR | Status: AC
  Administered 2018-11-30: 4 mg via INTRAVENOUS
  Filled 2018-11-30: qty 2

## 2018-11-30 MED ORDER — AMIODARONE HCL 200 MG PO TABS
100.0000 mg | ORAL_TABLET | Freq: Every day | ORAL | Status: DC
  Administered 2018-12-01 – 2018-12-07 (×7): 100 mg via ORAL
  Filled 2018-11-30 (×4): qty 1
  Filled 2018-11-30 (×2): qty 0.5
  Filled 2018-11-30 (×2): qty 1

## 2018-11-30 MED ORDER — CIPROFLOXACIN IN D5W 400 MG/200ML IV SOLN
400.0000 mg | Freq: Once | INTRAVENOUS | Status: AC
  Administered 2018-11-30: 400 mg via INTRAVENOUS
  Filled 2018-11-30: qty 200

## 2018-11-30 MED ORDER — CIPROFLOXACIN IN D5W 400 MG/200ML IV SOLN
400.0000 mg | Freq: Two times a day (BID) | INTRAVENOUS | Status: DC
  Administered 2018-12-01: 400 mg via INTRAVENOUS
  Filled 2018-11-30: qty 200

## 2018-11-30 MED ORDER — METRONIDAZOLE IN NACL 5-0.79 MG/ML-% IV SOLN
500.0000 mg | Freq: Three times a day (TID) | INTRAVENOUS | Status: DC
  Administered 2018-12-01: 500 mg via INTRAVENOUS
  Filled 2018-11-30: qty 100

## 2018-11-30 NOTE — ED Notes (Signed)
Patient transported to X-ray 

## 2018-11-30 NOTE — Progress Notes (Signed)
Family Meeting Note  Advance Directive:yes  Today a meeting took place with the Patient.    The following clinical team members were present during this meeting:MD  The following were discussed:Patient's diagnosis: Acute abdominal pain with proctocolitis, hyponatremia acute kidney injury, chronic atrial fibrillation possible urinary tract infection hypothyroidism, treatment plan of care discussed in detail with the patient and her family members at bedside.  Patient also has recent history of left intertrochanteric fracture status post repair   patient's progosis: Unable to determine and Goals for treatment: DNR  2 sons of the healthcare POA  Additional follow-up to be provided: Hospitalist  Time spent during discussion:17 MIN  Nicholes Mango, MD

## 2018-11-30 NOTE — ED Provider Notes (Signed)
[facility] Emergency Department Provider Note  ____________________________________________  Time seen: Approximately 5:43 PM  I have reviewed the triage vital signs and the nursing notes.   HISTORY  Chief Complaint Shortness of Breath    HPI Matthew Hicks. is a 85 y.o. male s/p L intertrochanteric fx s/p repair 11/16/18 currently at rehab facility, afib on Xarelto, cardiomyopathy, suprapubic catheter since 10/19 replaced yesterday, presenting for fever, elevated white blood cell count.  The patient is mentating normally and able to answer most questions; his family is also able to give some additional information.  Per report, the patient has been in rehab and has had chronic shortness of breath for months which is unchanged.  Over the past 2 days, he has had fever to 100.5.  He has had nausea with "heaving", pain in the suprapubic region, and diarrhea.  However, the patient was on 2 stool softeners and MiraLAX at the nursing facility, which she had not needed in the past so the family thinks the diarrhea may have come from that.  Patient denies any chest pain, lightheadedness or syncope.  Patient has a suprapubic catheter that was changed in the emergency department yesterday due to not having been changed for greater than 30 days.  Past Medical History:  Diagnosis Date  . A-fib ([place]) 04/08/2014  . Aortic stenosis   . Cardiomyopathy ([place])   . Cataract   . CVA (cerebral infarction) 2003  . Dysrhythmia   . GERD (gastroesophageal reflux disease)   . Hearing loss   . Hyperlipidemia   . Hypertension 05/04/2014  . Lung mass   . Macular degeneration   . Nephrolithiasis   . Pernicious anemia   . Pneumonia 05/2015  . Prostate cancer [facility]) 2007   performed by Dr. Eliberto Ivory  . Skin cancer   . Spinal stenosis   . Stroke [facility])     Patient Active Problem List   Diagnosis Date Noted  . Closed left hip fracture, initial encounter ([place]) 11/16/2018  . Adrenal nodule ([place])  07/15/2017  . Kidney stone on left side 05/23/2017  . Benign paroxysmal positional vertigo due to bilateral vestibular disorder 01/07/2017  . Pure hypercholesterolemia 05/13/2016  . History of stroke 11/14/2015  . Cerebral vascular accident ([place]) 07/02/2014  . BP (high blood pressure) 05/04/2014  . A-fib ([place]) 04/08/2014  . Benign hypertension 04/08/2014  . Aortic heart valve narrowing 04/08/2014  . Cardiomyopathy ([place]) 04/08/2014  . Acid reflux 04/08/2014  . HLD (hyperlipidemia) 04/08/2014  . PA (pernicious anemia) 04/08/2014  . Spinal stenosis 04/08/2014    Past Surgical History:  Procedure Laterality Date  . CATARACT EXTRACTION    . CIRCUMCISION  1994  . CYSTOSCOPY  1946  . CYSTOSCOPY N/A 08/10/2018   Procedure: CYSTOSCOPY, superpubic catheter placement;  Surgeon: Royston Cowper, MD;  Location: [facility] ORS;  Service: Urology;  Laterality: N/A;  . CYSTOSCOPY W/ URETERAL STENT REMOVAL Left 06/15/2017   Procedure: CYSTOSCOPY WITH STENT REMOVAL, RETROGRADE, AND HOLMIUM LASER;  Surgeon: Royston Cowper, MD;  Location: [facility] ORS;  Service: Urology;  Laterality: Left;  . CYSTOSCOPY WITH STENT PLACEMENT Left 05/23/2017   Procedure: CYSTOSCOPY WITH STENT PLACEMENT;  Surgeon: Heron Sabins, MD;  Location: [facility] ORS;  Service: Urology;  Laterality: Left;  . INTRAMEDULLARY (IM) NAIL INTERTROCHANTERIC Left 11/18/2018   Procedure: INTRAMEDULLARY (IM) NAIL INTERTROCHANTRIC;  Surgeon: Dereck Leep, MD;  Location: [facility] ORS;  Service: Orthopedics;  Laterality: Left;  . laparoscopic prostectomy    . Spinal cyst  removal    . TRANSURETHRAL RESECTION OF PROSTATE  2007   prostate cancer    Current Outpatient Rx  . Order #: [id number] Class: Historical Med  . Order #: [id number] Class: Historical Med  . Order #: [id number] Class: Historical Med  . Order #: [id number] Class: Historical Med  . Order #: [id number] Class: Historical Med  . Order #: [id number] Class: Historical Med  . Order #: [id number] Class:  Historical Med  . Order #: [id number] Class: Historical Med  . Order #: [id number] Class: Print  . Order #: [id number] Class: Historical Med  . Order #: [id number] Class: Historical Med    Allergies Amoxicillin-pot clavulanate; Fesoterodine fumarate er; and Sulfa antibiotics  Family History  Problem Relation Age of Onset  . Stroke Father        heart disease  . Pneumonia Father   . Alzheimer's disease Brother   . Colon cancer Daughter 16  . Cancer - Other Paternal Aunt 37       "GYN cancer"  . Cancer - Other Paternal Grandmother 38       "GYN cancer"    Social History Social History   Tobacco Use  . Smoking status: Former Smoker    Packs/day: 1.00    Years: 40.00    Pack years: 40.00    Types: Cigarettes    Last attempt to quit: 06/08/1977    Years since quitting: 41.5  . Smokeless tobacco: Never Used  . Tobacco comment: does not smoke now  Substance Use Topics  . Alcohol use: No    Alcohol/week: 0.0 standard drinks  . Drug use: No    Review of Systems Constitutional: Positive fever.  Positive general malaise.  Positive normal mentation. Eyes: No visual changes. ENT: No sore throat. No congestion or rhinorrhea. Cardiovascular: Denies chest pain. Denies palpitations. Respiratory: Positive chronic unchanged shortness of breath.  No cough. Gastrointestinal: Positive suprapubic abdominal pain.  No nausea, no vomiting.  No diarrhea.  No constipation. Genitourinary: Negative for dysuria.  Positive suprapubic catheter with dark urine. Musculoskeletal: Negative for back pain.  Recent left intertrochanteric fracture.  Positive swelling of the right upper extremity after IV placement, which is improving.  Positive bilateral lower extremity swelling. Skin: Negative for rash. Neurological: Negative for headaches. No focal numbness, tingling or weakness.     ____________________________________________   PHYSICAL EXAM:  VITAL SIGNS: ED Triage Vitals  Enc Vitals Group     BP  11/30/18 1730 119/65     Pulse Rate 11/30/18 1730 85     Resp 11/30/18 1730 19     Temp 11/30/18 1730 (!) 97.5 F (36.4 C)     Temp Source 11/30/18 1730 Oral     SpO2 11/30/18 1730 100 %     Weight 11/30/18 1732 134 lb 7.7 oz (61 kg)     Height 11/30/18 1732 5\' 5"  (1.651 m)     Head Circumference --      Peak Flow --      Pain Score 11/30/18 1732 0     Pain Loc --      Pain Edu? --      Excl. in [place]? --     Constitutional: Alert and answering questions appropriately.  He is protecting his airway.  GCS is 15. Eyes: Conjunctivae are normal.  EOMI. No scleral icterus.  No eye discharge. Head: Atraumatic. Nose: No congestion/rhinnorhea. Mouth/Throat: Mucous membranes are mildly dry.  Neck: No stridor.  Supple.  No JVD.  No meningismus. Cardiovascular: Normal rate, irregular rhythm. No murmurs, rubs or gallops.  Respiratory: Normal respiratory effort.  No accessory muscle use or retractions. Lungs CTAB.  No wheezes, rales or ronchi.  O2 sats are 100% on room air. Gastrointestinal: Soft, and nondistended.  The patient has a suprapubic catheter that is in place and draining dark urine with some sediment.  He has diffuse tenderness to palpation around the catheter site.  No guarding or rebound.  No peritoneal signs. Musculoskeletal: Positive bilateral symmetric pitting LE edema to the proximal tibial shaft. No ttp in the calves or palpable cords.  Negative Homan's sign. Neurologic: Alert with clear speech.  Face and smile are symmetric.  EOMI.  Moves all extremities well. Skin:  Skin is warm, dry and intact. No rash noted. Psychiatric: Mood and affect are normal. Speech and behavior are normal.  Normal judgement.  ____________________________________________   LABS (all labs ordered are listed, but only abnormal results are displayed)  Labs Reviewed  CULTURE, BLOOD (ROUTINE X 2)  CULTURE, BLOOD (ROUTINE X 2)  CBC  BASIC METABOLIC PANEL  TROPONIN I  BRAIN NATRIURETIC PEPTIDE  HEPATIC  FUNCTION PANEL  LIPASE, BLOOD  URINALYSIS, COMPLETE (UACMP) WITH MICROSCOPIC  LACTIC ACID, PLASMA  LACTIC ACID, PLASMA   ____________________________________________  EKG  ED ECG REPORT I, Anne-Caroline Mariea Clonts, the attending physician, personally viewed and interpreted this ECG.   Date: 11/30/2018  EKG Time: 1730  Rate: 99  Rhythm: Atrial fibrillation.  Axis: normal  Intervals:prolonged QTc  ST&T Change: No STEMI; poor baseline tracing.  This EKG is compared to 11/21/2018 where the patient does have a nonspecific interventricular conduction delay which looks similar to today except in V1.  ____________________________________________  RADIOLOGY  No results found.  ____________________________________________   PROCEDURES  Procedure(s) performed: None  Procedures  Critical Care performed: Yes ____________________________________________   INITIAL IMPRESSION / ASSESSMENT AND PLAN / ED COURSE  Pertinent labs & imaging results that were available during my care of the patient were reviewed by me and considered in my medical decision making (see chart for details).  85 y.o. male brought for fever and elevated white blood cell count, with multiple possible sources for infection.  The urine is the most likely source given its appearance and his suprapubic pain and a UA has been sent.  We will consider get a CT of the abdomen to make sure there is no complications from his suprapubic catheter or intestinal causes given his diarrhea.  Stool studies will be sent.  The patient will have basic laboratory studies including blood cultures sent.  I am concerned about his swelling and a troponin as well as BNP have been added.  The patient will likely require admission.  ----------------------------------------- 6:57 PM on 11/30/2018 -----------------------------------------  The patient has multiple concerning findings today.  Regarding his abdomen, he does have evidence of  proctocolitis with some free fluid in the abdomen.  He has a white blood cell count of 44.  I spoke with Dr. Celine Ahr, the general surgeon on-call, who will come and consult on this patient.  I have ordered ciprofloxacin and Flagyl.  The patient does have evidence of CHF with a BNP of 340 and a troponin of 0.09.  He is already anticoagulated, so aspirin is not indicated.  Despite his peripheral edema, I will give him a small 500 cc bolus for his infection.  The results of the patient's urinalysis.  The patient will require admission.    CRITICAL CARE Performed by: Eula Listen   Total critical care time: 50 minutes  Critical care time was  exclusive of separately billable procedures and treating other patients.  Critical care was necessary to treat or prevent imminent or life-threatening deterioration.  Critical care was time spent personally by me on the following activities: development of treatment plan with patient and/or surrogate as well as nursing, discussions with consultants, evaluation of patient's response to treatment, examination of patient, obtaining history from patient or surrogate, ordering and performing treatments and interventions, ordering and review of laboratory studies, ordering and review of radiographic studies, pulse oximetry and re-evaluation of patient's condition.   ____________________________________________  FINAL CLINICAL IMPRESSION(S) / ED DIAGNOSES  Final diagnoses:  None         NEW MEDICATIONS STARTED DURING THIS VISIT:  New Prescriptions   No medications on file      Eula Listen, MD 11/30/18 1900

## 2018-11-30 NOTE — ED Notes (Signed)
Patient resting comfortably in hospital bed. Awaiting bed status. Foley to gravity, IVF infusing. Call light with in reach. Safety maintained. Will monitor.

## 2018-11-30 NOTE — ED Notes (Signed)
Pt transferred to hospital bed by this NT, Kadijah NT, and Mayra NT. These NT's noticed pt was soiled, pt cleaned up and repositioned into hospital bed. Pressure dressing and barrier cream applied r/t redness.

## 2018-11-30 NOTE — ED Notes (Signed)
Pt has still not returned from imaging

## 2018-11-30 NOTE — Consult Note (Signed)
Reason for Consult:inflammation of colon seen on CT scan Referring Physician: Dr. Mariea Clonts, ED  Matthew Form. is an 85 y.o. male.  HPI: Matthew Hicks is a 85 y.o. male s/p L intertrochanteric fx s/p repair 11/16/18 currently at rehab facility, afib on Xarelto, cardiomyopathy, suprapubic catheter since 10/19 replaced yesterday, presenting for fever, elevated white blood cell count.  The patient is mentating normally and able to answer most questions; his family is also able to give some additional information.  Per report, the patient has been in rehab and has had chronic shortness of breath for months which is unchanged.  Over the past 2 days, he has had fever to 100.5.  He has had nausea with "heaving", pain in the suprapubic region, and diarrhea.  However, the patient was on 2 stool softeners and MiraLAX at the nursing facility, which she had not needed in the past so the family thinks the diarrhea may have come from that.  Patient denies any chest pain, lightheadedness or syncope.  Patient has a suprapubic catheter that was changed in the emergency department yesterday due to not having been changed for greater than 30 days.  General surgery was consulted by Dr. Mariea Clonts to evaluate proctocolitis seen on CT scan.  The patient denies any abdominal pain except at the site of his suprapubic catheter.  Past Medical History:  Diagnosis Date  . A-fib ([place]) 04/08/2014  . Aortic stenosis   . Cardiomyopathy ([place])   . Cataract   . CVA (cerebral infarction) 2003  . Dysrhythmia   . GERD (gastroesophageal reflux disease)   . Hearing loss   . Hyperlipidemia   . Hypertension 05/04/2014  . Lung mass   . Macular degeneration   . Nephrolithiasis   . Pernicious anemia   . Pneumonia 05/2015  . Prostate cancer [facility]) 2007   performed by Dr. Eliberto Ivory  . Skin cancer   . Spinal stenosis   . Stroke [facility])     Past Surgical History:  Procedure Laterality Date  . CATARACT EXTRACTION    . CIRCUMCISION  1994  .  CYSTOSCOPY  1946  . CYSTOSCOPY N/A 08/10/2018   Procedure: CYSTOSCOPY, superpubic catheter placement;  Surgeon: Royston Cowper, MD;  Location: [facility] ORS;  Service: Urology;  Laterality: N/A;  . CYSTOSCOPY W/ URETERAL STENT REMOVAL Left 06/15/2017   Procedure: CYSTOSCOPY WITH STENT REMOVAL, RETROGRADE, AND HOLMIUM LASER;  Surgeon: Royston Cowper, MD;  Location: [facility] ORS;  Service: Urology;  Laterality: Left;  . CYSTOSCOPY WITH STENT PLACEMENT Left 05/23/2017   Procedure: CYSTOSCOPY WITH STENT PLACEMENT;  Surgeon: Heron Sabins, MD;  Location: [facility] ORS;  Service: Urology;  Laterality: Left;  . INTRAMEDULLARY (IM) NAIL INTERTROCHANTERIC Left 11/18/2018   Procedure: INTRAMEDULLARY (IM) NAIL INTERTROCHANTRIC;  Surgeon: Dereck Leep, MD;  Location: [facility] ORS;  Service: Orthopedics;  Laterality: Left;  . laparoscopic prostectomy    . Spinal cyst removal    . TRANSURETHRAL RESECTION OF PROSTATE  2007   prostate cancer    Family History  Problem Relation Age of Onset  . Stroke Father        heart disease  . Pneumonia Father   . Alzheimer's disease Brother   . Colon cancer Daughter 66  . Cancer - Other Paternal Aunt 17       "GYN cancer"  . Cancer - Other Paternal Grandmother 78       "GYN cancer"    Social History:  reports that he quit smoking about 32  years ago. His smoking use included cigarettes. He has a 40.00 pack-year smoking history. He has never used smokeless tobacco. He reports that he does not drink alcohol or use drugs.  Allergies:  Allergies  Allergen Reactions  . Amoxicillin-Pot Clavulanate Swelling    Angioedema Has patient had a PCN reaction causing immediate rash, facial/tongue/throat swelling, SOB or lightheadedness with hypotension: Yes Has patient had a PCN reaction causing severe rash involving mucus membranes or skin necrosis: No Has patient had a PCN reaction that required hospitalization: No Has patient had a PCN reaction occurring within the last 10 years:  Yes If all of the above answers are "NO", then may proceed with Cephalosporin use.   Marland Kitchen Fesoterodine Fumarate Er Hives and Rash  . Sulfa Antibiotics Rash    Medications: I have reviewed the patient's current medications.  Results for orders placed or performed during the hospital encounter of 11/30/18 (from the past 48 hour(s))  CBC     Status: Abnormal   Collection Time: 11/30/18  5:45 PM  Result Value Ref Range   WBC 44.4 (H) 4.0 - 10.5 K/uL   RBC 4.44 4.22 - 5.81 MIL/uL   Hemoglobin 11.3 (L) 13.0 - 17.0 g/dL   HCT 35.2 (L) 39.0 - 52.0 %   MCV 79.3 (L) 80.0 - 100.0 fL   MCH 25.5 (L) 26.0 - 34.0 pg   MCHC 32.1 30.0 - 36.0 g/dL   RDW 16.9 (H) 11.5 - 15.5 %   Platelets 742 (H) 150 - 400 K/uL   nRBC 0.1 0.0 - 0.2 %    Comment: Performed at [facility], [address] [place], [place] [address]  Basic metabolic panel     Status: Abnormal   Collection Time: 11/30/18  5:45 PM  Result Value Ref Range   Sodium 128 (L) 135 - 145 mmol/L   Potassium 4.0 3.5 - 5.1 mmol/L   Chloride 93 (L) 98 - 111 mmol/L   CO2 21 (L) 22 - 32 mmol/L   Glucose, Bld 105 (H) 70 - 99 mg/dL   BUN 32 (H) 8 - 23 mg/dL   Creatinine, Ser 1.58 (H) 0.61 - 1.24 mg/dL   Calcium 7.7 (L) 8.9 - 10.3 mg/dL   GFR calc non Af Amer 38 (L) >60 mL/min   GFR calc Af Amer 44 (L) >60 mL/min   Anion gap 14 5 - 15    Comment: Performed at [facility], [address] [place], [place] [address]  Troponin I - ONCE - STAT     Status: Abnormal   Collection Time: 11/30/18  5:45 PM  Result Value Ref Range   Troponin I 0.09 (HH) <0.03 ng/mL    Comment: CRITICAL RESULT CALLED TO, READ BACK BY AND VERIFIED WITH ALIVIA TAYLOR @ 9735 ON 11/30/18 BY JUW Performed at [facility], [address] [place], [place] [address]   Brain natriuretic peptide     Status: Abnormal   Collection Time: 11/30/18  5:45 PM  Result Value Ref Range   B Natriuretic Peptide 340.0 (H) 0.0 - 100.0 pg/mL    Comment: Performed  at [facility], [address] [place], [place] [address]  Hepatic function panel     Status: Abnormal   Collection Time: 11/30/18  5:45 PM  Result Value Ref Range   Total Protein 5.3 (L) 6.5 - 8.1 g/dL   Albumin 2.4 (L) 3.5 - 5.0 g/dL   AST 39 15 - 41 U/L   ALT 47 (  H) 0 - 44 U/L   Alkaline Phosphatase 119 38 - 126 U/L   Total Bilirubin 2.0 (H) 0.3 - 1.2 mg/dL   Bilirubin, Direct 0.8 (H) 0.0 - 0.2 mg/dL   Indirect Bilirubin 1.2 (H) 0.3 - 0.9 mg/dL    Comment: Performed at [facility], [address] [place], [place] [address]    Ct Abdomen Pelvis Wo Contrast  Result Date: 11/30/2018 CLINICAL DATA:  Fever with leukocytosis. Patient was undergoing rehabilitation for left femoral fracture. EXAM: CT ABDOMEN AND PELVIS WITHOUT CONTRAST TECHNIQUE: Multidetector CT imaging of the abdomen and pelvis was performed following the standard protocol without IV contrast. COMPARISON:  None. FINDINGS: Lower chest: Cardiomegaly with coronary arteriosclerosis. No pericardial effusion. Small right effusion with right base atelectasis and peribronchial thickening/bronchiectasis. Hepatobiliary: No space-occupying mass. Biliary sludge noted of the moderately distended gallbladder. No mural thickening or pericholecystic fluid is seen. Pancreas: Slightly atrophic without ductal dilatation or mass. Spleen: Normal Adrenals/Urinary Tract: Stable bilateral adrenal nodules, nonspecific in etiology given lack of IV contrast enhancement. As measured similarly to prior, the right adrenal nodule measures up to 2.9 cm short axis and the left measures 2.1 cm. The kidneys demonstrate no significant cortical thinning, nephrolithiasis nor obstructive uropathy. Suprapubic catheter is noted within the urinary bladder which is decompressed in appearance and contains an air-fluid level likely from instrumentation. Stomach/Bowel: Small hiatal hernia. Decompressed stomach. The duodenal sweep and ligament of Treitz are  normal. No small bowel obstruction or inflammation. Diffuse colonic transmural thickening is identified from cecum through rectum consistent with proctocolitis. The appendix is not confidently identified. Vascular/Lymphatic: Moderate aortoiliac and branch vessel atherosclerosis. No lymphadenopathy. Reproductive: Brachytherapy seeds noted of the prostate. Other: Small to moderate volume of free fluid is identified within the pelvis and outlining the liver and spleen. Musculoskeletal: Levoscoliosis of the lumbar spine with facet arthropathy. Osteoarthritis of both hips. Skin staples project over the left hip with where there is evidence of a left femoral nail fixation of the proximal femur. Subcutaneous postop hematomas are identified lateral to the left hip, the largest measuring 7.3 x 3.5 x 3.9 cm. IMPRESSION: 1. Diffuse colonic transmural thickening from cecum through rectum consistent with proctocolitis. Small to moderate volume of free fluid is noted within the pelvis and outlining the liver and spleen. 2. Stable bilateral adrenal nodules, nonspecific in etiology given lack of IV contrast enhancement. 3. Small right pleural effusion with right base atelectasis and peribronchial thickening/bronchiectasis. 4. Brachytherapy seeds noted of the prostate. 5. Small postop subcutaneous hematomas overlying the left hip status post left femoral nail fixation. Electronically Signed   By: Ashley Royalty M.D.   On: 11/30/2018 18:25   Dg Chest 2 View  Result Date: 11/30/2018 CLINICAL DATA:  Fever. EXAM: CHEST - 2 VIEW COMPARISON:  Chest radiograph November 16, 2018 FINDINGS: Stable cardiomegaly. Ectatic versus aneurysmal aorta. Calcified aortic arch. Stable LEFT lung base scarring with volume loss and pleural thickening. RIGHT apical pleural thickening. No pneumothorax. Mild chronic interstitial changes. Osteopenia. High-riding humeral heads seen with old rotator cuff injuries. Scoliosis. IMPRESSION: Stable cardiomegaly and  RIGHT lung base scarring/pleural thickening. Aortic Atherosclerosis (ICD10-I70.0). Electronically Signed   By: Elon Alas M.D.   On: 11/30/2018 18:10    Review of Systems  Constitutional: Positive for fever.  HENT: Positive for hearing loss.   Respiratory: Positive for shortness of breath.   Cardiovascular: Positive for leg swelling.  Gastrointestinal: Positive for diarrhea.  All other systems reviewed and are negative.  Blood pressure 119/65,  pulse 85, temperature (!) 97.5 F (36.4 C), temperature source Oral, resp. rate (!) 25, height 5\' 5"  (1.651 m), weight 61 kg, SpO2 100 %. Physical Exam  Constitutional: No distress.  Very thin, elderly male  HENT:  Head: Normocephalic and atraumatic.  Hard of hearing.  Eyes: Pupils are equal, round, and reactive to light. Right eye exhibits no discharge. Left eye exhibits no discharge. No scleral icterus.  Neck: No tracheal deviation present.  Cardiovascular: Normal rate and regular rhythm.  Respiratory: Effort normal. No stridor. No respiratory distress.  GI: Soft. He exhibits no distension. There is abdominal tenderness. There is no rebound and no guarding.  At suprapubic catheter site. Dark, somewhat sludgy urine in catheter.  Genitourinary:    Genitourinary Comments: Deferred.   Musculoskeletal:        General: Edema present.  Neurological: He is alert.  Responds appropriately to questions.  Skin: Skin is warm and dry.  Psychiatric: He has a normal mood and affect.    Assessment/Plan: I personally reviewed the patient's CT scan.  There is thickening of the colon and rectum.  There is no free air present.  There is free fluid within the abdomen.  He does have an elevated white blood cell count.  He has ignificant multiple medical comorbidities.  Overall, I do not think he has a surgical issue at this time.  I agree with checking stool studies for infectious causes of the proctocolitis and treating empirically with antibiotics.  He  may also have a urinary tract infection contributing to the elevated white blood cell count.  His abdomen is soft, nontender, and certainly not peritoneal on exam.  Agree with the decision to admit him to the hospitalist service.  General surgery will follow.  Fredirick Maudlin 11/30/2018, 7:12 PM

## 2018-11-30 NOTE — ED Notes (Signed)
MD at bedside. 

## 2018-11-30 NOTE — Progress Notes (Signed)
ANTICOAGULATION CONSULT NOTE - Initial Consult  Pharmacy Consult for Xarelto  Indication: atrial fibrillation  Allergies  Allergen Reactions  . Amoxicillin-Pot Clavulanate Swelling    Angioedema Has patient had a PCN reaction causing immediate rash, facial/tongue/throat swelling, SOB or lightheadedness with hypotension: Yes Has patient had a PCN reaction causing severe rash involving mucus membranes or skin necrosis: No Has patient had a PCN reaction that required hospitalization: No Has patient had a PCN reaction occurring within the last 10 years: Yes If all of the above answers are "NO", then may proceed with Cephalosporin use.   Marland Kitchen Fesoterodine Fumarate Er Hives and Rash  . Sulfa Antibiotics Rash    Patient Measurements: Height: 5\' 5"  (165.1 cm) Weight: 134 lb 7.7 oz (61 kg) IBW/kg (Calculated) : 61.5 Heparin Dosing Weight:    Vital Signs: Temp: 97.5 F (36.4 C) (02/19 1730) Temp Source: Oral (02/19 1730) BP: 93/57 (02/19 2030) Pulse Rate: 85 (02/19 1730)  Labs: Recent Labs    11/30/18 1745  HGB 11.3*  HCT 35.2*  PLT 742*  CREATININE 1.58*  TROPONINI 0.09*    Estimated Creatinine Clearance: 26.3 mL/min (A) (by C-G formula based on SCr of 1.58 mg/dL (H)).   Medical History: Past Medical History:  Diagnosis Date  . A-fib ([place]) 04/08/2014  . Aortic stenosis   . Cardiomyopathy ([place])   . Cataract   . CVA (cerebral infarction) 2003  . Dysrhythmia   . GERD (gastroesophageal reflux disease)   . Hearing loss   . Hyperlipidemia   . Hypertension 05/04/2014  . Lung mass   . Macular degeneration   . Nephrolithiasis   . Pernicious anemia   . Pneumonia 05/2015  . Prostate cancer [facility]) 2007   performed by Dr. Eliberto Ivory  . Skin cancer   . Spinal stenosis   . Stroke [facility])     Medications:  (Not in a hospital admission)   Assessment: Pharmacy consulted to dose Xarelto in this 85 year old male with Afib.   CrCl (TBW) = 26.3 ml/min Pt is from SNF/rehab facility.   Per MAR from SNF, pt took last dose of Xarelto on 2/18 @ 2000.  Goal of Therapy:  Prevention of thromboembolism   Plan:  Pt appeared to be on Xarelto 10 mg PO daily , unsure why pt was on lower dose.  Will start Xarelto 15 mg PO daily on 2/19 @ 2130.    Danilo Cappiello D 11/30/2018,9:12 PM

## 2018-11-30 NOTE — ED Notes (Signed)
Pt was placed onto a hospital bed by three techs. Dressing applied to pt's coccyx due to some redness noted.

## 2018-11-30 NOTE — H&P (Signed)
[facility] NAME: Matthew Hicks    MR#:  [id number]  DATE OF BIRTH:  1924/12/14  DATE OF ADMISSION:  11/30/2018  PRIMARY CARE PHYSICIAN: Kirk Ruths, MD   REQUESTING/REFERRING PHYSICIAN: Mariea Clonts  CHIEF COMPLAINT:  Diarrhea  HISTORY OF PRESENT ILLNESS:  Matthew Hicks  is a 85 y.o. male with a known history of chronic atrial fibrillation on Xarelto, cardiomyopathy, recent history of left intertrochanteric fracture status post repair February 5 currently at rehab center is brought into the ED as patient has a fever and elevated white blood cell count.  For the past 2 days he has been having temp of 100.5.  Initially he was having diarrhea stool softeners were discontinued but still patient was having diarrhea and came into the ED CT abdomen has revealed proctocolitis and free fluid.  Patient is started on IV antibiotics.  Patient's patient has a suprapubic catheter  PAST MEDICAL HISTORY:   Past Medical History:  Diagnosis Date  . A-fib ([place]) 04/08/2014  . Aortic stenosis   . Cardiomyopathy ([place])   . Cataract   . CVA (cerebral infarction) 2003  . Dysrhythmia   . GERD (gastroesophageal reflux disease)   . Hearing loss   . Hyperlipidemia   . Hypertension 05/04/2014  . Lung mass   . Macular degeneration   . Nephrolithiasis   . Pernicious anemia   . Pneumonia 05/2015  . Prostate cancer [facility]) 2007   performed by Dr. Eliberto Ivory  . Skin cancer   . Spinal stenosis   . Stroke [facility])     PAST SURGICAL HISTOIRY:   Past Surgical History:  Procedure Laterality Date  . CATARACT EXTRACTION    . CIRCUMCISION  1994  . CYSTOSCOPY  1946  . CYSTOSCOPY N/A 08/10/2018   Procedure: CYSTOSCOPY, superpubic catheter placement;  Surgeon: Royston Cowper, MD;  Location: [facility] ORS;  Service: Urology;  Laterality: N/A;  . CYSTOSCOPY W/ URETERAL STENT REMOVAL Left 06/15/2017   Procedure: CYSTOSCOPY WITH STENT REMOVAL, RETROGRADE, AND HOLMIUM LASER;   Surgeon: Royston Cowper, MD;  Location: [facility] ORS;  Service: Urology;  Laterality: Left;  . CYSTOSCOPY WITH STENT PLACEMENT Left 05/23/2017   Procedure: CYSTOSCOPY WITH STENT PLACEMENT;  Surgeon: Heron Sabins, MD;  Location: [facility] ORS;  Service: Urology;  Laterality: Left;  . INTRAMEDULLARY (IM) NAIL INTERTROCHANTERIC Left 11/18/2018   Procedure: INTRAMEDULLARY (IM) NAIL INTERTROCHANTRIC;  Surgeon: Dereck Leep, MD;  Location: [facility] ORS;  Service: Orthopedics;  Laterality: Left;  . laparoscopic prostectomy    . Spinal cyst removal    . TRANSURETHRAL RESECTION OF PROSTATE  2007   prostate cancer    SOCIAL HISTORY:   Social History   Tobacco Use  . Smoking status: Former Smoker    Packs/day: 1.00    Years: 40.00    Pack years: 40.00    Types: Cigarettes    Last attempt to quit: 06/08/1977    Years since quitting: 41.5  . Smokeless tobacco: Never Used  . Tobacco comment: does not smoke now  Substance Use Topics  . Alcohol use: No    Alcohol/week: 0.0 standard drinks    FAMILY HISTORY:   Family History  Problem Relation Age of Onset  . Stroke Father        heart disease  . Pneumonia Father   . Alzheimer's disease Brother   . Colon cancer Daughter 71  . Cancer - Other Paternal Aunt 10       "  GYN cancer"  . Cancer - Other Paternal Grandmother 89       "GYN cancer"    DRUG ALLERGIES:   Allergies  Allergen Reactions  . Amoxicillin-Pot Clavulanate Swelling    Angioedema Has patient had a PCN reaction causing immediate rash, facial/tongue/throat swelling, SOB or lightheadedness with hypotension: Yes Has patient had a PCN reaction causing severe rash involving mucus membranes or skin necrosis: No Has patient had a PCN reaction that required hospitalization: No Has patient had a PCN reaction occurring within the last 10 years: Yes If all of the above answers are "NO", then may proceed with Cephalosporin use.   Marland Kitchen Fesoterodine Fumarate Er Hives and Rash  . Sulfa  Antibiotics Rash    REVIEW OF SYSTEMS:  CONSTITUTIONAL: No fever, fatigue or weakness.  EYES: No blurred or double vision.  EARS, NOSE, AND THROAT: No tinnitus or ear pain.  RESPIRATORY: No cough, shortness of breath, wheezing or hemoptysis.  CARDIOVASCULAR: No chest pain, orthopnea, edema.  GASTROINTESTINAL: No nausea, vomiting, reporting diarrhea, lower abdominal pain.  GENITOURINARY: No dysuria, hematuria.  ENDOCRINE: No polyuria, nocturia,  HEMATOLOGY: No anemia, easy bruising or bleeding SKIN: No rash or lesion. MUSCULOSKELETAL: No joint pain or arthritis.   NEUROLOGIC: No tingling, numbness, weakness.  PSYCHIATRY: No anxiety or depression.   MEDICATIONS AT HOME:   Prior to Admission medications   Medication Sig Start Date End Date Taking? Authorizing Provider  acetaminophen (TYLENOL) 650 MG CR tablet Take 650 mg by mouth every 8 (eight) hours as needed for pain.   Yes [provider]  amiodarone (PACERONE) 100 MG tablet Take 100 mg by mouth daily.   Yes [provider]  atorvastatin (LIPITOR) 20 MG tablet Take 20 mg by mouth daily.   Yes [provider]  Cyanocobalamin (B-12) 5000 MCG CAPS Take 5,000 mcg by mouth daily.    Yes [provider]  furosemide (LASIX) 20 MG tablet Take 20 mg by mouth daily.  09/11/15  Yes [provider]  levothyroxine (SYNTHROID, LEVOTHROID) 50 MCG tablet Take 50 mcg by mouth daily. 10/03/18 10/03/19 Yes [provider]  Multiple Vitamin (MULTIVITAMIN WITH MINERALS) TABS tablet Take 1 tablet by mouth daily.   Yes [provider]  Multiple Vitamins-Minerals (ICAPS) CAPS Take 1 tablet by mouth daily.   Yes [provider]  rivaroxaban (XARELTO) 10 MG TABS tablet TAKE 1 TABLET(10 MG TOTAL) BY MOUTH DAILY WITH DINNER. 08/26/15  Yes [provider]  vitamin C (ASCORBIC ACID) 250 MG tablet Take 500 mg by mouth daily.   Yes [provider]  zinc sulfate 220 (50 Zn) MG  capsule Take 220 mg by mouth daily.   Yes [provider]  azelastine (ASTELIN) 0.1 % nasal spray Place 2 sprays into the nose daily as needed for rhinitis or allergies.  11/14/15 06/07/17  [provider]  docusate sodium (COLACE) 100 MG capsule Take 100 mg by mouth 2 (two) times daily.    [provider]  oxyCODONE (OXY IR/ROXICODONE) 5 MG immediate release tablet Take 1 tablet (5 mg total) by mouth every 4 (four) hours as needed for moderate pain (pain score 4-6). 11/20/18   Dustin Flock, MD  Polyethylene Glycol 3350 (PEG 3350) POWD Take 17 g by mouth daily.    [provider]      VITAL SIGNS:  Blood pressure (!) 101/59, pulse 85, temperature (!) 97.5 F (36.4 C), temperature source Oral, resp. rate 18, height 5\' 5"  (1.651 m), weight 61  kg, SpO2 100 %.  PHYSICAL EXAMINATION:  GENERAL:  85 y.o.-year-old patient lying in the bed with no acute distress.  EYES: Pupils equal, round, reactive to light and accommodation. No scleral icterus. Extraocular muscles intact.  HEENT: Head atraumatic, normocephalic. Oropharynx and nasopharynx clear.  NECK:  Supple, no jugular venous distention. No thyroid enlargement, no tenderness.  LUNGS: Normal breath sounds bilaterally, no wheezing, rales,rhonchi or crepitation. No use of accessory muscles of respiration.  CARDIOVASCULAR: S1, S2 normal. No murmurs, rubs, or gallops.  ABDOMEN: Soft, nontender, nondistended. Bowel sounds present.  Suprapubic catheter is intact EXTREMITIES: No pedal edema, cyanosis, or clubbing.  NEUROLOGIC: Awake alert and oriented x3. Sensation intact. Gait not checked.  PSYCHIATRIC: The patient is alert and oriented x 3.  SKIN: No obvious rash, lesion, or ulcer.   LABORATORY PANEL:   CBC Recent Labs  Lab 11/30/18 1745  WBC 44.4*  HGB 11.3*  HCT 35.2*  PLT 742*    ------------------------------------------------------------------------------------------------------------------  Chemistries  Recent Labs  Lab 11/30/18 1745  NA 128*  K 4.0  CL 93*  CO2 21*  GLUCOSE 105*  BUN 32*  CREATININE 1.58*  CALCIUM 7.7*  AST 39  ALT 47*  ALKPHOS 119  BILITOT 2.0*   ------------------------------------------------------------------------------------------------------------------  Cardiac Enzymes Recent Labs  Lab 11/30/18 1745  TROPONINI 0.09*   ------------------------------------------------------------------------------------------------------------------  RADIOLOGY:  Ct Abdomen Pelvis Wo Contrast  Result Date: 11/30/2018 CLINICAL DATA:  Fever with leukocytosis. Patient was undergoing rehabilitation for left femoral fracture. EXAM: CT ABDOMEN AND PELVIS WITHOUT CONTRAST TECHNIQUE: Multidetector CT imaging of the abdomen and pelvis was performed following the standard protocol without IV contrast. COMPARISON:  None. FINDINGS: Lower chest: Cardiomegaly with coronary arteriosclerosis. No pericardial effusion. Small right effusion with right base atelectasis and peribronchial thickening/bronchiectasis. Hepatobiliary: No space-occupying mass. Biliary sludge noted of the moderately distended gallbladder. No mural thickening or pericholecystic fluid is seen. Pancreas: Slightly atrophic without ductal dilatation or mass. Spleen: Normal Adrenals/Urinary Tract: Stable bilateral adrenal nodules, nonspecific in etiology given lack of IV contrast enhancement. As measured similarly to prior, the right adrenal nodule measures up to 2.9 cm short axis and the left measures 2.1 cm. The kidneys demonstrate no significant cortical thinning, nephrolithiasis nor obstructive uropathy. Suprapubic catheter is noted within the urinary bladder which is decompressed in appearance and contains an air-fluid level likely from instrumentation. Stomach/Bowel: Small hiatal hernia.  Decompressed stomach. The duodenal sweep and ligament of Treitz are normal. No small bowel obstruction or inflammation. Diffuse colonic transmural thickening is identified from cecum through rectum consistent with proctocolitis. The appendix is not confidently identified. Vascular/Lymphatic: Moderate aortoiliac and branch vessel atherosclerosis. No lymphadenopathy. Reproductive: Brachytherapy seeds noted of the prostate. Other: Small to moderate volume of free fluid is identified within the pelvis and outlining the liver and spleen. Musculoskeletal: Levoscoliosis of the lumbar spine with facet arthropathy. Osteoarthritis of both hips. Skin staples project over the left hip with where there is evidence of a left femoral nail fixation of the proximal femur. Subcutaneous postop hematomas are identified lateral to the left hip, the largest measuring 7.3 x 3.5 x 3.9 cm. IMPRESSION: 1. Diffuse colonic transmural thickening from cecum through rectum consistent with proctocolitis. Small to moderate volume of free fluid is noted within the pelvis and outlining the liver and spleen. 2. Stable bilateral adrenal nodules, nonspecific in etiology given lack of IV contrast enhancement. 3. Small right pleural effusion with right base atelectasis and peribronchial thickening/bronchiectasis. 4. Brachytherapy seeds noted of the prostate. 5. Small postop subcutaneous  hematomas overlying the left hip status post left femoral nail fixation. Electronically Signed   By: Ashley Royalty M.D.   On: 11/30/2018 18:25   Dg Chest 2 View  Result Date: 11/30/2018 CLINICAL DATA:  Fever. EXAM: CHEST - 2 VIEW COMPARISON:  Chest radiograph November 16, 2018 FINDINGS: Stable cardiomegaly. Ectatic versus aneurysmal aorta. Calcified aortic arch. Stable LEFT lung base scarring with volume loss and pleural thickening. RIGHT apical pleural thickening. No pneumothorax. Mild chronic interstitial changes. Osteopenia. High-riding humeral heads seen with old  rotator cuff injuries. Scoliosis. IMPRESSION: Stable cardiomegaly and RIGHT lung base scarring/pleural thickening. Aortic Atherosclerosis (ICD10-I70.0). Electronically Signed   By: Elon Alas M.D.   On: 11/30/2018 18:10   US Venous Img Lower Bilateral  Result Date: 11/30/2018 CLINICAL DATA:  Lower extremity edema with history of recent surgery EXAM: BILATERAL LOWER EXTREMITY VENOUS DOPPLER ULTRASOUND TECHNIQUE: Gray-scale sonography with graded compression, as well as color Doppler and duplex ultrasound were performed to evaluate the lower extremity deep venous systems from the level of the common femoral vein and including the common femoral, femoral, profunda femoral, popliteal and calf veins including the posterior tibial, peroneal and gastrocnemius veins when visible. The superficial great saphenous vein was also interrogated. Spectral Doppler was utilized to evaluate flow at rest and with distal augmentation maneuvers in the common femoral, femoral and popliteal veins. COMPARISON:  None. FINDINGS: RIGHT LOWER EXTREMITY Common Femoral Vein: No evidence of thrombus. Normal compressibility, respiratory phasicity and response to augmentation. Saphenofemoral Junction: No evidence of thrombus. Normal compressibility and flow on color Doppler imaging. Profunda Femoral Vein: No evidence of thrombus. Normal compressibility and flow on color Doppler imaging. Femoral Vein: No evidence of thrombus. Normal compressibility, respiratory phasicity and response to augmentation. Popliteal Vein: No evidence of thrombus. Normal compressibility, respiratory phasicity and response to augmentation. Calf Veins: Limited evaluation secondary to edema LEFT LOWER EXTREMITY Common Femoral Vein: No evidence of thrombus. Normal compressibility, respiratory phasicity and response to augmentation. Saphenofemoral Junction: No evidence of thrombus. Normal compressibility and flow on color Doppler imaging. Profunda Femoral Vein: No  evidence of thrombus. Normal compressibility and flow on color Doppler imaging. Femoral Vein: No evidence of thrombus. Normal compressibility, respiratory phasicity and response to augmentation. Popliteal Vein: No evidence of thrombus. Normal compressibility, respiratory phasicity and response to augmentation. Calf Veins: Limited evaluation secondary to edema Cyst in the medial popliteal fossa measuring 3 x 1.6 by 1.9 cm. IMPRESSION: No evidence of deep venous thrombosis. Limited evaluation of calf vessels due to edema. 3 cm cyst in the left popliteal fossa Electronically Signed   By: Donavan Foil M.D.   On: 11/30/2018 19:21    EKG:   Orders placed or performed during the hospital encounter of 11/30/18  . EKG 12-Lead  . EKG 12-Lead    IMPRESSION AND PLAN:   #Acute abdominal pain secondary to acute proctocolitis Admit to MedSurg unit IV ciprofloxacin and Flagyl Hydrate with IV fluids Pain management as needed Check stool for C. difficile toxin  #Abnormal urinalysis We will get urine culture and sensitivity IV fluids, on ciprofloxacin  #Acute kidney injury IV fluids and avoid nephrotoxins and recheck BMP in a.m.  #Hyponatremia secondary to dehydration from diarrhea  sodium at 128 Check urine sodium and osmolality and hydrate with IV fluids and repeat BMP in a.m.  #Recent history of left intertrochanteric fracture Pain management as needed and PT evaluation  #Chronic atrial fibrillation rate controlled continue home medications Xarelto    All the records are reviewed and  case discussed with ED provider. Management plans discussed with the patient, family and they are in agreement.  CODE STATUS: DNR, 2 sons healthcare POA  TOTAL TIME TAKING CARE OF THIS PATIENT: 45 minutes.   Note: This dictation was prepared with Dragon dictation along with smaller phrase technology. Any transcriptional errors that result from this process are unintentional.  Nicholes Mango M.D on 11/30/2018 at  10:23 PM  Between 7am to 6pm - Pager - [phone]  After 6pm go to [email or web address] - password [email or web address] [facility]  [facility]  Office  [phone]  CC: Primary care physician; Kirk Ruths, MD

## 2018-11-30 NOTE — ED Triage Notes (Signed)
Pt to Ed from [facility] where pt is undergoing rehab for a left femur fracture. Pt was reported to have a 100.5 F temp yesterday per family. [facility] performed blood testing and results showed WBC of 39. PT reporting SOB x multiple weeks.   98/52 86 HR CBG 144 97.66F 21 CO2

## 2018-11-30 NOTE — ED Notes (Signed)
Troponin of 0.09. Provider will be notified.

## 2018-12-01 DIAGNOSIS — L8990 Pressure ulcer of unspecified site, unspecified stage: Secondary | ICD-10-CM

## 2018-12-01 LAB — C DIFFICILE QUICK SCREEN W PCR REFLEX
C Diff antigen: POSITIVE — AB
C Diff interpretation: DETECTED
C Diff toxin: POSITIVE — AB

## 2018-12-01 LAB — COMPREHENSIVE METABOLIC PANEL
ALBUMIN: 2 g/dL — AB (ref 3.5–5.0)
ALT: 35 U/L (ref 0–44)
AST: 30 U/L (ref 15–41)
Alkaline Phosphatase: 130 U/L — ABNORMAL HIGH (ref 38–126)
Anion gap: 11 (ref 5–15)
BILIRUBIN TOTAL: 1.3 mg/dL — AB (ref 0.3–1.2)
BUN: 38 mg/dL — ABNORMAL HIGH (ref 8–23)
CO2: 21 mmol/L — ABNORMAL LOW (ref 22–32)
Calcium: 7.2 mg/dL — ABNORMAL LOW (ref 8.9–10.3)
Chloride: 96 mmol/L — ABNORMAL LOW (ref 98–111)
Creatinine, Ser: 1.73 mg/dL — ABNORMAL HIGH (ref 0.61–1.24)
GFR calc Af Amer: 39 mL/min — ABNORMAL LOW (ref >60)
GFR calc non Af Amer: 34 mL/min — ABNORMAL LOW (ref >60)
Glucose, Bld: 102 mg/dL — ABNORMAL HIGH (ref 70–99)
Potassium: 3.9 mmol/L (ref 3.5–5.1)
Sodium: 128 mmol/L — ABNORMAL LOW (ref 135–145)
Total Protein: 4.6 g/dL — ABNORMAL LOW (ref 6.5–8.1)

## 2018-12-01 LAB — GASTROINTESTINAL PANEL BY PCR, STOOL (REPLACES STOOL CULTURE)

## 2018-12-01 LAB — CBC
HCT: 31.9 % — ABNORMAL LOW (ref 39.0–52.0)
Hemoglobin: 10.4 g/dL — ABNORMAL LOW (ref 13.0–17.0)
MCH: 25.8 pg — AB (ref 26.0–34.0)
MCHC: 32.6 g/dL (ref 30.0–36.0)
MCV: 79.2 fL — ABNORMAL LOW (ref 80.0–100.0)
Platelets: 636 10*3/uL — ABNORMAL HIGH (ref 150–400)
RBC: 4.03 MIL/uL — ABNORMAL LOW (ref 4.22–5.81)
RDW: 16.6 % — ABNORMAL HIGH (ref 11.5–15.5)
WBC: 43.4 10*3/uL — ABNORMAL HIGH (ref 4.0–10.5)
nRBC: 0.1 % (ref 0.0–0.2)

## 2018-12-01 LAB — LACTIC ACID, PLASMA: Lactic Acid, Venous: 2 mmol/L (ref 0.5–1.9)

## 2018-12-01 LAB — TROPONIN I: Troponin I: <0.03 ng/mL (ref <0.03)

## 2018-12-01 MED ORDER — SODIUM CHLORIDE 0.9 % IV BOLUS
1000.0000 mL | Freq: Once | INTRAVENOUS | Status: AC
  Administered 2018-12-01: 1000 mL via INTRAVENOUS

## 2018-12-01 MED ORDER — LEVOTHYROXINE SODIUM 50 MCG PO TABS
50.0000 ug | ORAL_TABLET | Freq: Every day | ORAL | Status: DC
  Administered 2018-12-02 – 2018-12-07 (×6): 50 ug via ORAL
  Filled 2018-12-01 (×6): qty 1

## 2018-12-01 MED ORDER — VANCOMYCIN 50 MG/ML ORAL SOLUTION
125.0000 mg | Freq: Four times a day (QID) | ORAL | Status: DC
  Administered 2018-12-01 – 2018-12-07 (×25): 125 mg via ORAL
  Filled 2018-12-01 (×30): qty 2.5

## 2018-12-01 MED ORDER — OXYCODONE HCL 5 MG PO TABS: 5.0000 mg | ORAL_TABLET | ORAL | Status: DC | PRN

## 2018-12-01 MED ORDER — DEXTROSE 50 % IV SOLN
INTRAVENOUS | Status: AC
  Filled 2018-12-01: qty 50

## 2018-12-01 NOTE — Progress Notes (Addendum)
[facility] SURGICAL PROGRESS NOTE (cpt [phone])  Hospital Day(s): 1.   Post op day(s):  Marland Kitchen   Interval History: Patient seen and examined, no acute events or new complaints overnight. Patient reports he continues to have loose watery non-bloody diarrhea. He denied any abdominal pain but felt some discomfort near his suprapubic catheter when he gets the urge to urinate. No complaints of fever, chills, nausea, emesis, CP, or SOB. Advanced to heart healthy diet. Was found to be C diff positive. .   Review of Systems:  Constitutional: denies fever, chills  Respiratory: denies any shortness of breath  Cardiovascular: denies chest pain or palpitations  Gastrointestinal: denies abdominal pain, N/V, + diarrhea/and bowel function as per interval history Genitourinary: denies burning with urination or urinary frequency Neurological: denies HA or vision/hearing changes    Vital signs in last 24 hours: [min-max] current  Temp:  [97.5 F (36.4 C)-98.7 F (37.1 C)] 98.6 F (37 C) (02/20 1019) Pulse Rate:  [43-153] 113 (02/20 1019) Resp:  [14-25] 21 (02/20 1019) BP: (85-119)/(47-78) 102/57 (02/20 1019) SpO2:  [94 %-100 %] 99 % (02/20 1019) Weight:  [61 kg] 61 kg (02/19 1732)     Height: 5\' 5"  (165.1 cm) Weight: 61 kg BMI (Calculated): 22.38   Intake/Output this shift:  No intake/output data recorded.    Physical Exam:  Constitutional: alert, cooperative and no distress  HENT: normocephalic without obvious abnormality  Eyes: EOM's grossly intact and symmetric  Respiratory: breathing non-labored at rest  Gastrointestinal: soft, non-tender, and non-distended, no rebound/guarding. Suprapubic catheter in place.  Musculoskeletal: no edema or wounds, motor and sensation grossly intact, NT    Labs:  CBC Latest Ref Rng & Units 12/01/2018 11/30/2018 11/20/2018  WBC 4.0 - 10.5 K/uL 43.4(H) 44.4(H) 13.4(H)  Hemoglobin 13.0 - 17.0 g/dL 10.4(L) 11.3(L) 8.9(L)  Hematocrit 39.0 - 52.0 %  31.9(L) 35.2(L) 28.5(L)  Platelets 150 - 400 K/uL 636(H) 742(H) 262   CMP Latest Ref Rng & Units 12/01/2018 11/30/2018 11/20/2018  Glucose 70 - 99 mg/dL 102(H) 105(H) 99  BUN 8 - 23 mg/dL 38(H) 32(H) 27(H)  Creatinine 0.61 - 1.24 mg/dL 1.73(H) 1.58(H) 1.17  Sodium 135 - 145 mmol/L 128(L) 128(L) 133(L)  Potassium 3.5 - 5.1 mmol/L 3.9 4.0 3.8  Chloride 98 - 111 mmol/L 96(L) 93(L) 99  CO2 22 - 32 mmol/L 21(L) 21(L) 28  Calcium 8.9 - 10.3 mg/dL 7.2(L) 7.7(L) 7.7(L)  Total Protein 6.5 - 8.1 g/dL 4.6(L) 5.3(L) -  Total Bilirubin 0.3 - 1.2 mg/dL 1.3(H) 2.0(H) -  Alkaline Phos 38 - 126 U/L 130(H) 119 -  AST 15 - 41 U/L 30 39 -  ALT 0 - 44 U/L 35 47(H) -     Imaging studies: No new pertinent imaging studies   Assessment/Plan: (ICD-10's: K52.9) 85 y.o. male with slightly improved leukocytosis and proctocolitis attributable to c diff infection, complicated by pertinent comorbidities including being s/p L intertrochanteric fx s/p repair 11/16/18 currently at rehab facility, afib on Xarelto,cardiomyopathy, HTN, HLD, hisotry of prostate cancer s/p resection requiring suprapubic catheter, and former tobacco abuse (smoking).    - Advance diet as tolerates   - Continue to monitor leukocytosis, abdominal exam, and on-going bowel function  - Agree with plan to start PO vancomycin for c. Diff treatment  - No indication for emergent surgical intervention  - Medical management per primary team  - General surgery will sign off, please re-consult if new issues (ex: concern for toxic megacolon) arise.    All  of the above findings and recommendations were discussed with the patient, and the medical team, and all of patient's questions were answered to his expressed satisfaction.  -- Edison Simon, PA-C [facility] 12/01/2018, 11:11 AM [phone] M-F: 7am - 4pm  I saw and evaluated the patient.  I agree with the above documentation, exam, and plan, which I have edited where  appropriate. Fredirick Maudlin  12:08 PM

## 2018-12-01 NOTE — ED Notes (Signed)
Pt is AOx4, vss, he does not c/o pain at this time nor does he show any signs of distress. Pt is in bed with rails upx2, on the monitor and call bell is within reach. We will continue to monitor the pt.

## 2018-12-01 NOTE — ED Notes (Signed)
Patient's skin assessed- some skin tears found, along with diffuse ecchymosis. Fissure and possible ulcer found on buttocks/sacrum. Barrier cream applied

## 2018-12-01 NOTE — Progress Notes (Addendum)
When ask if experience pain pt stated "it hurts all over" Pt would not state a number on the 0-10 scale. Made use of face scale to score pt pain level.

## 2018-12-01 NOTE — ED Notes (Signed)
Pt's daughter [name] has been updated on the pt's status and plan of care via phone.

## 2018-12-01 NOTE — Progress Notes (Addendum)
[place] [facility] [place] NAME: Matthew Hicks    MR#:  [id number]  DATE OF BIRTH:  May 09, 1925  SUBJECTIVE:   Patient states he is feeling better this morning.  Denies any nausea, vomiting, abdominal pain.  He endorses diarrhea.  No fevers or chills.  REVIEW OF SYSTEMS:  Review of Systems  Constitutional: Negative for chills and fever.  HENT: Negative for congestion and sore throat.   Eyes: Negative for blurred vision and double vision.  Respiratory: Positive for shortness of breath. Negative for cough.   Cardiovascular: Negative for chest pain and palpitations.  Gastrointestinal: Positive for diarrhea. Negative for abdominal pain, nausea and vomiting.  Genitourinary: Negative for dysuria and urgency.  Musculoskeletal: Positive for joint pain. Negative for back pain and neck pain.  Neurological: Negative for dizziness and headaches.  Psychiatric/Behavioral: Negative for depression. The patient is not nervous/anxious.     DRUG ALLERGIES:   Allergies  Allergen Reactions  . Amoxicillin-Pot Clavulanate Swelling    Angioedema Has patient had a PCN reaction causing immediate rash, facial/tongue/throat swelling, SOB or lightheadedness with hypotension: Yes Has patient had a PCN reaction causing severe rash involving mucus membranes or skin necrosis: No Has patient had a PCN reaction that required hospitalization: No Has patient had a PCN reaction occurring within the last 10 years: Yes If all of the above answers are "NO", then may proceed with Cephalosporin use.   Marland Kitchen Fesoterodine Fumarate Er Hives and Rash  . Sulfa Antibiotics Rash   VITALS:  Blood pressure 104/66, pulse 87, temperature (!) 97.4 F (36.3 C), temperature source Oral, resp. rate 18, height 5\' 5"  (1.651 m), weight 61 kg, SpO2 94 %. PHYSICAL EXAMINATION:  Physical Exam  GENERAL:  85 y.o.-year-old patient lying in the bed with no acute distress.  EYES: Pupils equal, round,  reactive to light and accommodation. No scleral icterus. Extraocular muscles intact.  HEENT: Head atraumatic, normocephalic. Oropharynx and nasopharynx clear.  NECK:  Supple, no jugular venous distention. No thyroid enlargement, no tenderness.  LUNGS: Normal breath sounds bilaterally, no wheezing, rales,rhonchi or crepitation. No use of accessory muscles of respiration.  CARDIOVASCULAR: Irregularly irregular rate, regular rhythm, S1, S2 normal. No murmurs, rubs, or gallops.  ABDOMEN: Soft, nontender, nondistended. Bowel sounds present.  +Suprapubic catheter appears normal, without any surrounding erythema EXTREMITIES: No pedal edema, cyanosis, or clubbing.  NEUROLOGIC: Awake alert and oriented x3. Sensation intact. Gait not checked.  PSYCHIATRIC: The patient is alert and oriented x 3. LABORATORY PANEL:  Male CBC Recent Labs  Lab 12/01/18 0537  WBC 43.4*  HGB 10.4*  HCT 31.9*  PLT 636*   ------------------------------------------------------------------------------------------------------------------ Chemistries  Recent Labs  Lab 12/01/18 0537  NA 128*  K 3.9  CL 96*  CO2 21*  GLUCOSE 102*  BUN 38*  CREATININE 1.73*  CALCIUM 7.2*  AST 30  ALT 35  ALKPHOS 130*  BILITOT 1.3*   RADIOLOGY:  No results found. ASSESSMENT AND PLAN:   C. difficile colitis- patient with markedly elevated WBCs and lactic acidosis. -Start p.o. vancomycin -Stop IV Cipro and Flagyl -Continue IV fluids  Abnormal urinalysis- patient does have a suprapubic catheter. -No urine culture ordered on admission -Will order urine culture now, although patient has received a dose of Cipro and Flagyl  Acute kidney injury- likely due to dehydration in the setting of diarrhea -Continue IV fluids -Avoid nephrotoxic agents -Recheck creatinine in the morning  Elevated troponin- likely due to demand ischemia.  Initial troponin 0  0.09 > <0.03. -Monitor   Hyponatremia secondary to dehydration from  diarrhea- sodium is 128 -Continue IV fluids  Recent history of left intertrochanteric fracture -Pain control -PT consult  Chronic atrial fibrillation-rate controlled -Continue Xarelto and amiodarone  Hypothyroidism-stable -Continue Synthroid  All the records are reviewed and case discussed with Care Management/Social Worker. Management plans discussed with the patient, family and they are in agreement.  CODE STATUS: DNR  TOTAL TIME TAKING CARE OF THIS PATIENT: 40 minutes.   More than 50% of the time was spent in counseling/coordination of care: YES  POSSIBLE D/C IN 2-3 DAYS, DEPENDING ON CLINICAL CONDITION.   Matthew Hicks M.D on 12/01/2018 at 7:10 PM  Between 7am to 6pm - Pager [phone]  After 6pm go to [email or web address] - Technical brewer [facility]  Office  [phone]  CC: Primary care physician; Kirk Ruths, MD  Note: This dictation was prepared with Dragon dictation along with smaller phrase technology. Any transcriptional errors that result from this process are unintentional.

## 2018-12-02 ENCOUNTER — Inpatient Hospital Stay: Payer: Medicare Other

## 2018-12-02 DIAGNOSIS — D473 Essential (hemorrhagic) thrombocythemia: Secondary | ICD-10-CM

## 2018-12-02 DIAGNOSIS — R109 Unspecified abdominal pain: Secondary | ICD-10-CM

## 2018-12-02 DIAGNOSIS — A0472 Enterocolitis due to Clostridium difficile, not specified as recurrent: Principal | ICD-10-CM

## 2018-12-02 DIAGNOSIS — D72829 Elevated white blood cell count, unspecified: Secondary | ICD-10-CM

## 2018-12-02 DIAGNOSIS — R509 Fever, unspecified: Secondary | ICD-10-CM

## 2018-12-02 LAB — CBC WITH DIFFERENTIAL/PLATELET
Abs Immature Granulocytes: 3.8 10*3/uL — ABNORMAL HIGH (ref 0.00–0.07)
Basophils Absolute: 0 10*3/uL (ref 0.0–0.1)
Basophils Relative: 0 %
Eosinophils Absolute: 0 10*3/uL (ref 0.0–0.5)
Eosinophils Relative: 0 %
HCT: 34.2 % — ABNORMAL LOW (ref 39.0–52.0)
HEMOGLOBIN: 11.1 g/dL — AB (ref 13.0–17.0)
Immature Granulocytes: 7 %
Lymphocytes Relative: 2 %
Lymphs Abs: 0.8 10*3/uL (ref 0.7–4.0)
MCH: 25.7 pg — ABNORMAL LOW (ref 26.0–34.0)
MCHC: 32.5 g/dL (ref 30.0–36.0)
MCV: 79.2 fL — ABNORMAL LOW (ref 80.0–100.0)
Monocytes Absolute: 2 10*3/uL — ABNORMAL HIGH (ref 0.1–1.0)
Monocytes Relative: 4 %
Neutro Abs: 45.9 10*3/uL — ABNORMAL HIGH (ref 1.7–7.7)
Neutrophils Relative %: 87 %
Platelets: 671 10*3/uL — ABNORMAL HIGH (ref 150–400)
RBC: 4.32 MIL/uL (ref 4.22–5.81)
RDW: 16.8 % — ABNORMAL HIGH (ref 11.5–15.5)
SMEAR REVIEW: NORMAL
WBC: 52.5 10*3/uL (ref 4.0–10.5)
nRBC: 0.2 % (ref 0.0–0.2)

## 2018-12-02 LAB — IRON AND TIBC
Iron: 13 ug/dL — ABNORMAL LOW (ref 45–182)
Saturation Ratios: 7 % — ABNORMAL LOW (ref 17.9–39.5)
TIBC: 188 ug/dL — ABNORMAL LOW (ref 250–450)
UIBC: 175 ug/dL

## 2018-12-02 LAB — PATHOLOGIST SMEAR REVIEW

## 2018-12-02 LAB — CBC
HCT: 34.4 % — ABNORMAL LOW (ref 39.0–52.0)
Hemoglobin: 11.1 g/dL — ABNORMAL LOW (ref 13.0–17.0)
MCH: 25.3 pg — ABNORMAL LOW (ref 26.0–34.0)
MCHC: 32.3 g/dL (ref 30.0–36.0)
MCV: 78.5 fL — ABNORMAL LOW (ref 80.0–100.0)
Platelets: 686 10*3/uL — ABNORMAL HIGH (ref 150–400)
RBC: 4.38 MIL/uL (ref 4.22–5.81)
RDW: 16.5 % — ABNORMAL HIGH (ref 11.5–15.5)
WBC: 52.1 10*3/uL (ref 4.0–10.5)
nRBC: 0.2 % (ref 0.0–0.2)

## 2018-12-02 LAB — BASIC METABOLIC PANEL
ANION GAP: 11 (ref 5–15)
BUN: 53 mg/dL — ABNORMAL HIGH (ref 8–23)
CO2: 20 mmol/L — ABNORMAL LOW (ref 22–32)
Calcium: 7.2 mg/dL — ABNORMAL LOW (ref 8.9–10.3)
Chloride: 99 mmol/L (ref 98–111)
Creatinine, Ser: 2.18 mg/dL — ABNORMAL HIGH (ref 0.61–1.24)
GFR calc Af Amer: 30 mL/min — ABNORMAL LOW (ref >60)
GFR calc non Af Amer: 26 mL/min — ABNORMAL LOW (ref >60)
GLUCOSE: 113 mg/dL — AB (ref 70–99)
Potassium: 4.3 mmol/L (ref 3.5–5.1)
Sodium: 130 mmol/L — ABNORMAL LOW (ref 135–145)

## 2018-12-02 LAB — RETICULOCYTES
Immature Retic Fract: 35.6 % — ABNORMAL HIGH (ref 2.3–15.9)
RBC.: 4.35 MIL/uL (ref 4.22–5.81)
Retic Count, Absolute: 154.9 10*3/uL (ref 19.0–186.0)
Retic Ct Pct: 3.6 % — ABNORMAL HIGH (ref 0.4–3.1)

## 2018-12-02 LAB — FERRITIN: Ferritin: 448 ng/mL — ABNORMAL HIGH (ref 24–336)

## 2018-12-02 MED ORDER — SODIUM CHLORIDE 0.9 % IV SOLN
INTRAVENOUS | Status: DC
  Administered 2018-12-02 – 2018-12-03 (×3): via INTRAVENOUS

## 2018-12-02 MED ORDER — SODIUM CHLORIDE 0.9 % IV BOLUS
1000.0000 mL | Freq: Once | INTRAVENOUS | Status: DC
  Administered 2018-12-02: 08:00:00 1000 mL via INTRAVENOUS

## 2018-12-02 NOTE — Progress Notes (Signed)
   12/02/18 0900  Clinical Encounter Type  Visited With Patient  Visit Type Initial  Ch was rounding. Pt expressed that he was doing good today. Ch left with a blessing.

## 2018-12-02 NOTE — Progress Notes (Signed)
CRITICAL VALUE ALERT  Critical Value:  WBC 52.1  Date & Time Notied:  12/02/2018 aprox 0508 a.m.  Provider Notified: Marcille Blanco, MD  Orders Received/Actions taken: no new orders at this time

## 2018-12-02 NOTE — Progress Notes (Signed)
ANTICOAGULATION CONSULT NOTE - Follow up [facility] for Xarelto  Indication: atrial fibrillation  Allergies  Allergen Reactions  . Amoxicillin-Pot Clavulanate Swelling    Angioedema Has patient had a PCN reaction causing immediate rash, facial/tongue/throat swelling, SOB or lightheadedness with hypotension: Yes Has patient had a PCN reaction causing severe rash involving mucus membranes or skin necrosis: No Has patient had a PCN reaction that required hospitalization: No Has patient had a PCN reaction occurring within the last 10 years: Yes If all of the above answers are "NO", then may proceed with Cephalosporin use.   Marland Kitchen Fesoterodine Fumarate Er Hives and Rash  . Sulfa Antibiotics Rash    Patient Measurements: Height: 5\' 5"  (165.1 cm) Weight: 134 lb 7.7 oz (61 kg) IBW/kg (Calculated) : 61.5  Vital Signs: Temp: 97.9 F (36.6 C) (02/21 0438) BP: 105/70 (02/21 0440) Pulse Rate: 74 (02/21 0440)  Labs: Recent Labs    11/30/18 1745 12/01/18 0537 12/01/18 1223 12/02/18 0332  HGB 11.3* 10.4*  --  11.1*  11.1*  HCT 35.2* 31.9*  --  34.2*  34.4*  PLT 742* 636*  --  671*  686*  CREATININE 1.58* 1.73*  --  2.18*  TROPONINI 0.09*  --  <0.03  --     Estimated Creatinine Clearance: 19 mL/min (A) (by C-G formula based on SCr of 2.18 mg/dL (H)).   Assessment: Pharmacy consulted to dose Xarelto in this 85 year old male with Afib.   Pt is from SNF/rehab facility.  Per MAR from SNF, pt took last dose of Xarelto on 2/18 @ 2000. Pt appeared to be on Xarelto 10 mg PO daily , unsure why pt was on lower dose.   Goal of Therapy:  Prevention of thromboembolism   Plan:  Continue Xarelto 15 mg PO daily with supper Will need to continue to monitor renal function SCr and CBC at least every three days per policy   Rayna Sexton L 12/02/2018,10:03 AM

## 2018-12-02 NOTE — Clinical Social Work Note (Signed)
Clinical Social Work Assessment  Patient Details  Name: Matthew Hicks. MRN: [id number] Date of Birth: Jul 30, 1925  Date of referral:  12/02/18               Reason for consult:  Facility Placement                Permission sought to share information with:  Case Manager, Customer service manager, Family Supports Permission granted to share information::  Yes, Verbal Permission Granted  Name::        Agency::     Relationship::     Contact Information:     Housing/Transportation Living arrangements for the past 2 months:  [place] of Information:  Adult Children Patient Interpreter Needed:  None Criminal Activity/Legal Involvement Pertinent to Current Situation/Hospitalization:  No - Comment as needed Significant Relationships:  Adult Children, Spouse Lives with:  Facility Resident Do you feel safe going back to the place where you live?  Yes Need for family participation in patient care:  Yes (Comment)  Care giving concerns:  Patient from [facility]    Social Worker assessment / plan:  CSW consulted for facility placement. CSW met with patient's son Eion Timbrook at bedside.  Son states that patient has been at [facility] for 11 days and they are currently holding the bed at [facility] until 12/05/17. Son states that if patient is able to do therapy he would like patient to return to [facility] but if not they are prepared for other options. CSW also notified Tina at [facility] of admission. CSW will continue to follow for discharge planning.   Employment status:  Retired Forensic scientist:  Medicare PT Recommendations:  Not assessed at this time [place] / Referral to community resources:  [place]  Patient/Family's Response to care:  Patient is sleeping during interview.   Patient/Family's Understanding of and Emotional Response to Diagnosis, Current Treatment, and Prognosis:  Son thanked CSW and understands discharge plan   Emotional  Assessment Appearance:  Appears stated age Attitude/Demeanor/Rapport:    Affect (typically observed):    Orientation:  Oriented to Self Alcohol / Substance use:  Not Applicable Psych involvement (Current and /or in the community):  No (Comment)  Discharge Needs  Concerns to be addressed:  Discharge Planning Concerns Readmission within the last 30 days:  Yes Current discharge risk:  None Barriers to Discharge:  Continued Medical Work up   [employer], [place] 12/02/2018, 11:46 AM

## 2018-12-02 NOTE — NC FL2 (Signed)
[facility] LEVEL OF CARE SCREENING TOOL     IDENTIFICATION  Patient Name: Matthew Hicks. Birthdate: Feb 15, 1925 Sex: male Admission Date (Current Location): 11/30/2018  [place] and [place] Number:  Engineering geologist and Address:  [facility], [address] [place], [place] [address]      Provider Number: [id number]  Attending Physician Name and Address:  Mayo, Pete Pelt, MD  Relative Name and Phone Number:       Current Level of Care: Hospital Recommended Level of Care: [place] Prior Approval Number:    Date Approved/Denied:   PASRR Number:    Discharge Plan: SNF    Current Diagnoses: Patient Active Problem List   Diagnosis Date Noted  . Pressure injury of skin 12/01/2018  . Acute colitis 11/30/2018  . Closed left hip fracture, initial encounter ([place]) 11/16/2018  . Adrenal nodule ([place]) 07/15/2017  . Kidney stone on left side 05/23/2017  . Benign paroxysmal positional vertigo due to bilateral vestibular disorder 01/07/2017  . Pure hypercholesterolemia 05/13/2016  . History of stroke 11/14/2015  . Cerebral vascular accident ([place]) 07/02/2014  . BP (high blood pressure) 05/04/2014  . A-fib ([place]) 04/08/2014  . Benign hypertension 04/08/2014  . Aortic heart valve narrowing 04/08/2014  . Cardiomyopathy ([place]) 04/08/2014  . Acid reflux 04/08/2014  . HLD (hyperlipidemia) 04/08/2014  . PA (pernicious anemia) 04/08/2014  . Spinal stenosis 04/08/2014    Orientation RESPIRATION BLADDER Height & Weight     Self  Normal Continent Weight: 134 lb 7.7 oz (61 kg) Height:  5\' 5"  (165.1 cm)  BEHAVIORAL SYMPTOMS/MOOD NEUROLOGICAL BOWEL NUTRITION STATUS  (none) (none) Continent Diet(Heart Healthy )  AMBULATORY STATUS COMMUNICATION OF NEEDS Skin   Extensive Assist Verbally Other (Comment)(Stage 2 on coccyx )                       Personal Care Assistance Level of Assistance  Bathing, Feeding, Dressing Bathing  Assistance: Limited assistance Feeding assistance: Independent Dressing Assistance: Limited assistance     Functional Limitations Info  Sight, Speech, Hearing Sight Info: Adequate Hearing Info: Adequate Speech Info: Adequate    SPECIAL CARE FACTORS FREQUENCY  PT (By licensed PT), OT (By licensed OT)                    Contractures Contractures Info: Not present    Additional Factors Info  Code Status, Allergies Code Status Info: DNR Allergies Info: Amoxicillin-pot Clavulanate, Fesoterodine Fumarate Er, Sulfa Antibiotics           Current Medications (12/02/2018):  This is the current hospital active medication list Current Facility-Administered Medications  Medication Dose Route Frequency Provider Last Rate Last Dose  . 0.9 %  sodium chloride infusion   Intravenous Continuous Mayo, Pete Pelt, MD 100 mL/hr at 12/02/18 0827    . acetaminophen (TYLENOL) tablet 650 mg  650 mg Oral Q8H PRN Gouru, Aruna, MD      . amiodarone (PACERONE) tablet 100 mg  100 mg Oral Daily Gouru, Aruna, MD   100 mg at 12/02/18 1125  . azelastine (ASTELIN) 0.1 % nasal spray 2 spray  2 spray Each Nare Daily PRN Gouru, Aruna, MD      . levothyroxine (SYNTHROID, LEVOTHROID) tablet 50 mcg  50 mcg Oral Q0600 Milas Hock, RPH   50 mcg at 12/02/18 0500  . ondansetron (ZOFRAN) tablet 4 mg  4 mg Oral Q6H PRN Nicholes Mango, MD  Or  . ondansetron (ZOFRAN) injection 4 mg  4 mg Intravenous Q6H PRN Gouru, Aruna, MD      . oxyCODONE (Oxy IR/ROXICODONE) immediate release tablet 5 mg  5 mg Oral Q4H PRN Mayo, Pete Pelt, MD      . Rivaroxaban Alveda Reasons) tablet 15 mg  15 mg Oral Q supper Gouru, Illene Silver, MD   15 mg at 12/01/18 1703  . traMADol (ULTRAM) tablet 50 mg  50 mg Oral Q6H PRN Gouru, Aruna, MD   50 mg at 12/01/18 2103  . vancomycin (VANCOCIN) 50 mg/mL oral solution 125 mg  125 mg Oral QID Sela Hua, MD   125 mg at 12/02/18 1125     Discharge Medications: Please see discharge summary for a list  of discharge medications.  Relevant Imaging Results:  Relevant Lab Results:   Additional Information    Jarquis Walker  Louretta Shorten, LCSWA

## 2018-12-02 NOTE — Progress Notes (Signed)
Suprapubic cath dressing changed and site cleansed. Drainage from insertion site present. Appears brownish yellow color on dressing. Site appears red color circumferential surround site  Urine specimen collected from cath and sent to lab.

## 2018-12-02 NOTE — Progress Notes (Addendum)
[place] at [place] NAME: Shaheen Mende    MR#:  [id number]  DATE OF BIRTH:  03/25/1925  SUBJECTIVE:   Patient states he is feeling weaker this morning.  He endorses right upper extremity edema.  He denies any nausea, vomiting, belly pain, he endorses a couple episodes of diarrhea in the last 24 hours.  REVIEW OF SYSTEMS:  Review of Systems  Constitutional: Negative for chills and fever.  HENT: Negative for congestion and sore throat.   Eyes: Negative for blurred vision and double vision.  Respiratory: Positive for shortness of breath. Negative for cough.   Cardiovascular: Negative for chest pain and palpitations.  Gastrointestinal: Positive for diarrhea. Negative for abdominal pain, nausea and vomiting.  Genitourinary: Negative for dysuria and urgency.  Musculoskeletal: Positive for joint pain. Negative for back pain and neck pain.  Neurological: Negative for dizziness and headaches.  Psychiatric/Behavioral: Negative for depression. The patient is not nervous/anxious.     DRUG ALLERGIES:   Allergies  Allergen Reactions  . Amoxicillin-Pot Clavulanate Swelling    Angioedema Has patient had a PCN reaction causing immediate rash, facial/tongue/throat swelling, SOB or lightheadedness with hypotension: Yes Has patient had a PCN reaction causing severe rash involving mucus membranes or skin necrosis: No Has patient had a PCN reaction that required hospitalization: No Has patient had a PCN reaction occurring within the last 10 years: Yes If all of the above answers are "NO", then may proceed with Cephalosporin use.   Marland Kitchen Fesoterodine Fumarate Er Hives and Rash  . Sulfa Antibiotics Rash   VITALS:  Blood pressure 105/70, pulse 74, temperature 97.9 F (36.6 C), resp. rate 18, height 5\' 5"  (1.651 m), weight 61 kg, SpO2 93 %. PHYSICAL EXAMINATION:  Physical Exam  GENERAL:  85 y.o.-year-old patient lying in the bed with no acute distress.   Tired-appearing, but nontoxic. EYES: Pupils equal, round, reactive to light and accommodation. No scleral icterus. Extraocular muscles intact.  HEENT: Head atraumatic, normocephalic. Oropharynx and nasopharynx clear.  NECK:  Supple, no jugular venous distention. No thyroid enlargement, no tenderness.  LUNGS: Normal breath sounds bilaterally, no wheezing, rales,rhonchi or crepitation. No use of accessory muscles of respiration.  CARDIOVASCULAR: Irregularly irregular rate, regular rhythm, S1, S2 normal. No murmurs, rubs, or gallops.  ABDOMEN: Soft, nontender, nondistended. Bowel sounds present.  +Suprapubic catheter appears normal, with some mild surrounding erythema EXTREMITIES: 1+ pitting edema to the mid shins bilaterally.  No cyanosis, or clubbing. + Right upper extremity edema. NEUROLOGIC: Awake alert and oriented x3. Sensation intact. Gait not checked.  PSYCHIATRIC: The patient is alert and oriented x 3. LABORATORY PANEL:  Male CBC Recent Labs  Lab 12/02/18 0332  WBC 52.5*  52.1*  HGB 11.1*  11.1*  HCT 34.2*  34.4*  PLT 671*  686*   ------------------------------------------------------------------------------------------------------------------ Chemistries  Recent Labs  Lab 12/01/18 0537 12/02/18 0332  NA 128* 130*  K 3.9 4.3  CL 96* 99  CO2 21* 20*  GLUCOSE 102* 113*  BUN 38* 53*  CREATININE 1.73* 2.18*  CALCIUM 7.2* 7.2*  AST 30  --   ALT 35  --   ALKPHOS 130*  --   BILITOT 1.3*  --    RADIOLOGY:  US Venous Img Upper Uni Right  Result Date: 12/02/2018 CLINICAL DATA:  Right upper extremity edema. History of prostate carcinoma. EXAM: RIGHT UPPER EXTREMITY VENOUS DOPPLER ULTRASOUND TECHNIQUE: Gray-scale sonography with graded compression, as well as color Doppler and duplex ultrasound were performed  to evaluate the upper extremity deep venous system from the level of the subclavian vein and including the jugular, axillary, basilic, radial, ulnar and upper cephalic  vein. Spectral Doppler was utilized to evaluate flow at rest and with distal augmentation maneuvers. COMPARISON:  None. FINDINGS: Contralateral Subclavian Vein: Respiratory phasicity is normal and symmetric with the symptomatic side. No evidence of thrombus. Normal compressibility. Internal Jugular Vein: No evidence of thrombus. Normal compressibility, respiratory phasicity and response to augmentation. Subclavian Vein: No evidence of thrombus. Normal compressibility, respiratory phasicity and response to augmentation. Axillary Vein: No evidence of thrombus. Normal compressibility, respiratory phasicity and response to augmentation. Cephalic Vein: No evidence of thrombus. Normal compressibility, respiratory phasicity and response to augmentation. Basilic Vein: No evidence of thrombus. Normal compressibility, respiratory phasicity and response to augmentation. Brachial Veins: No evidence of thrombus. Normal compressibility, respiratory phasicity and response to augmentation. Radial Veins: No evidence of thrombus. Normal compressibility, respiratory phasicity and response to augmentation. Ulnar Veins: No evidence of thrombus. Normal compressibility, respiratory phasicity and response to augmentation. Venous Reflux:  None visualized. Other Findings: No evidence of superficial thrombophlebitis or abnormal fluid collection. IMPRESSION: No evidence of DVT within the right upper extremity. Electronically Signed   By: Aletta Edouard M.D.   On: 12/02/2018 10:33   ASSESSMENT AND PLAN:   C. difficile colitis- lactic acidosis has improved, but with worsening leukocytosis -Continue p.o. vancomycin -Continue IV fluids  Abnormal urinalysis- patient does have a suprapubic catheter. -No urine culture ordered on admission -Will order urine culture now, although patient has received a dose of Cipro and Flagyl -Would like to hold off on antibiotics if possible, in the setting of C. Difficile  Leukocytosis- worsening  despite treating C. Difficile. Afebrile. -Will ask hematology to see today  Right upper extremity edema- new this morning -Check a right upper extremity Doppler ultrasound to rule out DVT -Continue Xarelto  Acute kidney injury- likely due to dehydration in the setting of diarrhea.  Creatinine worsening. -Increase IV fluids over the next 12 hours -Avoid nephrotoxic agents -Recheck creatinine in the morning -If creatinine worsens again tomorrow, would get nephrology consult  Elevated troponin- likely due to demand ischemia.  Resolved -Monitor   Hyponatremia- secondary to dehydration from diarrhea.  Sodium improving. -Continue IV fluids  Recent history of left intertrochanteric fracture -Pain control -PT consult  Microcytic anemia- likely due to iron deficiency -Anemia panel ordered -Hematology to see  Chronic atrial fibrillation-rate controlled -Continue Xarelto and amiodarone  Hypothyroidism-stable -Continue Synthroid  All the records are reviewed and case discussed with Care Management/Social Worker. Management plans discussed with the patient, family and they are in agreement.  CODE STATUS: DNR  TOTAL TIME TAKING CARE OF THIS PATIENT: 40 minutes.   More than 50% of the time was spent in counseling/coordination of care: YES  POSSIBLE D/C IN 2-3 DAYS, DEPENDING ON CLINICAL CONDITION.   Berna Spare Mayo M.D on 12/02/2018 at 11:41 AM  Between 7am to 6pm - Pager - [phone]  After 6pm go to [email or web address] - Technical brewer [facility]  Office  [phone]  CC: Primary care physician; Kirk Ruths, MD  Note: This dictation was prepared with Dragon dictation along with smaller phrase technology. Any transcriptional errors that result from this process are unintentional.

## 2018-12-02 NOTE — Care Management Important Message (Signed)
Important Message  Patient Details  Name: Matthew Hicks. MRN: [id number] Date of Birth: [birth year]   Medicare Important Message Given:  Yes    Shelbie Ammons, RN 12/02/2018, 12:08 PM

## 2018-12-02 NOTE — Consult Note (Signed)
[facility]  Date of admission:  11/30/2018  Inpatient day:  12/02/2018  Consulting physician:  Dr. Pete Pelt Mayo.  Reason for Consultation:  Marked leukocytosis.  Chief Complaint: Matthew Hicks. is a 85 y.o. male with abdominal pain, fever, and leukocytosis who was admitted through the emergency room with proctocolitis.  HPI:  The patient underwent internal fixation of a left intertrochanteric femur fracture on 11/18/2018 by Dr. Marry Guan.  He was discharged to [facility] for rehabilitation.  CBC on 11/20/2018 revealed a hematocrit of 28.5, hemoglobin 8.9, MCV80.3, platelets 262,000, WBC 13,400.  Patient was brought to the ER with a fever (100-101) and an elevated WBC.  He was having diarrhea.  Abdomen and pelvic CT on 11/30/2018 revealed diffuse colonic transmural thickening from cecum through rectum c/w proctocolitis. There was a small to moderate volume of free fluid is noted within the pelvis and outlining the liver and spleen.  There were stable bilateral adrenal nodules, nonspecific in etiology given lack of IV contrast enhancement.  There was a small right pleural effusion with right base atelectasis and peribronchial thickening/bronchiectasis.  There were brachytherapy seeds noted of the prostate and a small postop subcutaneous hematomas overlying the left hip status post left femoral nail fixation.  CBC on admission revealed a hematocrit of 31.9, hemoglobin 10.4, MCV 79.2, platelets 636,000, and WBC 43,400.  CBC today reveals a hematocrit of 34.2, hemoglobin 11.1, platelets 671,000, WBC 52,500 with an ANC of 45,00.  Retic was 3.6%.  Peripheral smear revealed absolute neutrophilia with mildly left shifted maturation, favor reactive. There was microcytic, hypochromic anemia and absolute thrombocytosis.  Stool was + C diff toxin on 12/01/2018.  He is on oral vancomycin.  Symptomatically, he notes fatigue.  He notes swelling in his arm (duplex negative) and legs  (duplex negative).   Past Medical History:  Diagnosis Date  . A-fib ([place]) 04/08/2014  . Aortic stenosis   . Cardiomyopathy ([place])   . Cataract   . CVA (cerebral infarction) 2003  . Dysrhythmia   . GERD (gastroesophageal reflux disease)   . Hearing loss   . Hyperlipidemia   . Hypertension 05/04/2014  . Lung mass   . Macular degeneration   . Nephrolithiasis   . Pernicious anemia   . Pneumonia 05/2015  . Prostate cancer [facility]) 2007   performed by Dr. Eliberto Ivory  . Skin cancer   . Spinal stenosis   . Stroke [facility])     Past Surgical History:  Procedure Laterality Date  . CATARACT EXTRACTION    . CIRCUMCISION  1994  . CYSTOSCOPY  1946  . CYSTOSCOPY N/A 08/10/2018   Procedure: CYSTOSCOPY, superpubic catheter placement;  Surgeon: Royston Cowper, MD;  Location: [facility] ORS;  Service: Urology;  Laterality: N/A;  . CYSTOSCOPY W/ URETERAL STENT REMOVAL Left 06/15/2017   Procedure: CYSTOSCOPY WITH STENT REMOVAL, RETROGRADE, AND HOLMIUM LASER;  Surgeon: Royston Cowper, MD;  Location: [facility] ORS;  Service: Urology;  Laterality: Left;  . CYSTOSCOPY WITH STENT PLACEMENT Left 05/23/2017   Procedure: CYSTOSCOPY WITH STENT PLACEMENT;  Surgeon: Heron Sabins, MD;  Location: [facility] ORS;  Service: Urology;  Laterality: Left;  . INTRAMEDULLARY (IM) NAIL INTERTROCHANTERIC Left 11/18/2018   Procedure: INTRAMEDULLARY (IM) NAIL INTERTROCHANTRIC;  Surgeon: Dereck Leep, MD;  Location: [facility] ORS;  Service: Orthopedics;  Laterality: Left;  . laparoscopic prostectomy    . Spinal cyst removal    . TRANSURETHRAL RESECTION OF PROSTATE  2007   prostate cancer    Family History  Problem Relation Age of Onset  . Stroke Father        heart disease  . Pneumonia Father   . Alzheimer's disease Brother   . Colon cancer Daughter 76  . Cancer - Other Paternal Aunt 2       "GYN cancer"  . Cancer - Other Paternal Grandmother 32       "GYN cancer"    Social History:  reports that he quit smoking about 41 years ago. His  smoking use included cigarettes. He has a 40.00 pack-year smoking history. He has never used smokeless tobacco. He reports that he does not drink alcohol or use drugs. He is a resident of [facility] for rehab after his recent fracture.  He is accompanied by his son.   Allergies:  Allergies  Allergen Reactions  . Amoxicillin-Pot Clavulanate Swelling    Angioedema Has patient had a PCN reaction causing immediate rash, facial/tongue/throat swelling, SOB or lightheadedness with hypotension: Yes Has patient had a PCN reaction causing severe rash involving mucus membranes or skin necrosis: No Has patient had a PCN reaction that required hospitalization: No Has patient had a PCN reaction occurring within the last 10 years: Yes If all of the above answers are "NO", then may proceed with Cephalosporin use.   Marland Kitchen Fesoterodine Fumarate Er Hives and Rash  . Sulfa Antibiotics Rash    Medications Prior to Admission  Medication Sig Dispense Refill  . acetaminophen (TYLENOL) 650 MG CR tablet Take 650 mg by mouth every 8 (eight) hours as needed for pain.    Marland Kitchen amiodarone (PACERONE) 100 MG tablet Take 100 mg by mouth daily.    Marland Kitchen atorvastatin (LIPITOR) 20 MG tablet Take 20 mg by mouth daily.    . Cyanocobalamin (B-12) 5000 MCG CAPS Take 5,000 mcg by mouth daily.     . furosemide (LASIX) 20 MG tablet Take 20 mg by mouth daily.     Marland Kitchen levothyroxine (SYNTHROID, LEVOTHROID) 50 MCG tablet Take 50 mcg by mouth daily.    . Multiple Vitamin (MULTIVITAMIN WITH MINERALS) TABS tablet Take 1 tablet by mouth daily.    . Multiple Vitamins-Minerals (ICAPS) CAPS Take 1 tablet by mouth daily.    . rivaroxaban (XARELTO) 10 MG TABS tablet TAKE 1 TABLET(10 MG TOTAL) BY MOUTH DAILY WITH DINNER.    Marland Kitchen vitamin C (ASCORBIC ACID) 250 MG tablet Take 500 mg by mouth daily.    Marland Kitchen zinc sulfate 220 (50 Zn) MG capsule Take 220 mg by mouth daily.    Marland Kitchen azelastine (ASTELIN) 0.1 % nasal spray Place 2 sprays into the nose daily as needed for  rhinitis or allergies.     Marland Kitchen docusate sodium (COLACE) 100 MG capsule Take 100 mg by mouth 2 (two) times daily.    Marland Kitchen oxyCODONE (OXY IR/ROXICODONE) 5 MG immediate release tablet Take 1 tablet (5 mg total) by mouth every 4 (four) hours as needed for moderate pain (pain score 4-6). 30 tablet 0  . Polyethylene Glycol 3350 (PEG 3350) POWD Take 17 g by mouth daily.      Review of Systems: GENERAL:  Fatigue.  No fevers, sweats or weight loss. PERFORMANCE STATUS (ECOG):  2 HEENT:  No visual changes, runny nose, sore throat, mouth sores or tenderness. Lungs: No shortness of breath or cough.  No hemoptysis. Cardiac:  No chest pain, palpitations, orthopnea, or PND. GI:  Diarrhea.  No nausea, vomiting, constipation, melena or hematochezia. GU:  Suprapubic catheter secondary to recurrent stones and obstruction.  No  urgency, frequency, dysuria, or hematuria. Musculoskeletal:  No back pain.  Joint pain.  No muscle tenderness. Extremities:  No pain.  Right arm and bilateral lower extremity swelling. Skin:  No rashes or skin changes. Neuro:  No headache, numbness or weakness, balance or coordination issues. Endocrine:  No diabetes.  Thyroid issues on Synthroid.  No hot flashes or night sweats. Psych:  No mood changes, depression or anxiety. Pain:  No focal pain. Review of systems:  All other systems reviewed and found to be negative.  Physical Exam:  Blood pressure 105/70, pulse 74, temperature 97.9 F (36.6 C), resp. rate 18, height 5\' 5"  (1.651 m), weight 134 lb 7.7 oz (61 kg), SpO2 93 %.  GENERAL:  Elderly gentleman lying comfortably on the medical unit in no acute distress.  He defers much conversation to his son. MENTAL STATUS:  Alert and oriented to person, place and time. HEAD:  Pearline Cables hair.  Normocephalic, atraumatic, face symmetric, no Cushingoid features. EYES:  Glasses.  Pupils equal round and reactive to light and accomodation.  No conjunctivitis or scleral icterus. ENT:  Oropharynx clear  without lesion.  Tongue normal. Mucous membranes dry.  RESPIRATORY:  Clear to auscultation without rales, wheezes or rhonchi. CARDIOVASCULAR:  Regular rate and rhythm without murmur, rub or gallop. ABDOMEN:  Soft, non-tender, with active bowel sounds, and no hepatosplenomegaly.  No masses. SKIN:  No rashes, ulcers or lesions. EXTREMITIES: Pitting edema in lower extremities and right arm (left arm spared).  No skin discoloration or tenderness.  No palpable cords. LYMPH NODES: No palpable cervical, supraclavicular, axillary or inguinal adenopathy  NEUROLOGICAL: Unremarkable. PSYCH:  Appropriate.   Results for orders placed or performed during the hospital encounter of 11/30/18 (from the past 48 hour(s))  Lactic acid, plasma     Status: Abnormal   Collection Time: 11/30/18  5:43 PM  Result Value Ref Range   Lactic Acid, Venous 2.1 (HH) 0.5 - 1.9 mmol/L    Comment: CRITICAL RESULT CALLED TO, READ BACK BY AND VERIFIED WITH ALIVIA TAYLOR @2100  11/30/18 AKT Performed at [facility], [address] [place], [place] [address]   CBC     Status: Abnormal   Collection Time: 11/30/18  5:45 PM  Result Value Ref Range   WBC 44.4 (H) 4.0 - 10.5 K/uL   RBC 4.44 4.22 - 5.81 MIL/uL   Hemoglobin 11.3 (L) 13.0 - 17.0 g/dL   HCT 35.2 (L) 39.0 - 52.0 %   MCV 79.3 (L) 80.0 - 100.0 fL   MCH 25.5 (L) 26.0 - 34.0 pg   MCHC 32.1 30.0 - 36.0 g/dL   RDW 16.9 (H) 11.5 - 15.5 %   Platelets 742 (H) 150 - 400 K/uL   nRBC 0.1 0.0 - 0.2 %    Comment: Performed at [facility] [address] [place], [place] [address]  Basic metabolic panel     Status: Abnormal   Collection Time: 11/30/18  5:45 PM  Result Value Ref Range   Sodium 128 (L) 135 - 145 mmol/L   Potassium 4.0 3.5 - 5.1 mmol/L   Chloride 93 (L) 98 - 111 mmol/L   CO2 21 (L) 22 - 32 mmol/L   Glucose, Bld 105 (H) 70 - 99 mg/dL   BUN 32 (H) 8 - 23 mg/dL   Creatinine, Ser 1.58 (H) 0.61 - 1.24 mg/dL   Calcium 7.7 (L) 8.9 - 10.3 mg/dL    GFR calc non Af Amer 38 (L) >60 mL/min   GFR  calc Af Amer 44 (L) >60 mL/min   Anion gap 14 5 - 15    Comment: Performed at [facility], [address] [place], [place] [address]  Troponin I - ONCE - STAT     Status: Abnormal   Collection Time: 11/30/18  5:45 PM  Result Value Ref Range   Troponin I 0.09 (HH) <0.03 ng/mL    Comment: CRITICAL RESULT CALLED TO, READ BACK BY AND VERIFIED WITH ALIVIA TAYLOR @ 6301 ON 11/30/18 BY JUW Performed at [facility], [address] [place], [place] [address]   Brain natriuretic peptide     Status: Abnormal   Collection Time: 11/30/18  5:45 PM  Result Value Ref Range   B Natriuretic Peptide 340.0 (H) 0.0 - 100.0 pg/mL    Comment: Performed at [facility], [address] [place], [place] [address]  Hepatic function panel     Status: Abnormal   Collection Time: 11/30/18  5:45 PM  Result Value Ref Range   Total Protein 5.3 (L) 6.5 - 8.1 g/dL   Albumin 2.4 (L) 3.5 - 5.0 g/dL   AST 39 15 - 41 U/L   ALT 47 (H) 0 - 44 U/L   Alkaline Phosphatase 119 38 - 126 U/L   Total Bilirubin 2.0 (H) 0.3 - 1.2 mg/dL   Bilirubin, Direct 0.8 (H) 0.0 - 0.2 mg/dL   Indirect Bilirubin 1.2 (H) 0.3 - 0.9 mg/dL    Comment: Performed at [facility], [address] [place], [place] [address]  Lipase, blood     Status: None   Collection Time: 11/30/18  5:45 PM  Result Value Ref Range   Lipase 18 11 - 51 U/L    Comment: Performed at [facility], [address] [place], [place] [address]  Urinalysis, Complete w Microscopic     Status: Abnormal   Collection Time: 11/30/18  5:45 PM  Result Value Ref Range   Color, Urine AMBER (A) YELLOW    Comment: BIOCHEMICALS MAY BE AFFECTED BY COLOR   APPearance CLOUDY (A) CLEAR   Specific Gravity, Urine 1.017 1.005 - 1.030   pH 6.0 5.0 - 8.0   Glucose, UA NEGATIVE NEGATIVE mg/dL   Hgb urine dipstick MODERATE (A) NEGATIVE   Bilirubin Urine NEGATIVE NEGATIVE   Ketones, ur  NEGATIVE NEGATIVE mg/dL   Protein, ur 30 (A) NEGATIVE mg/dL   Nitrite NEGATIVE NEGATIVE   Leukocytes,Ua MODERATE (A) NEGATIVE   RBC / HPF 21-50 0 - 5 RBC/hpf   WBC, UA >50 (H) 0 - 5 WBC/hpf   Bacteria, UA MANY (A) NONE SEEN   Squamous Epithelial / LPF 0-5 0 - 5   Mucus PRESENT    Hyaline Casts, UA PRESENT    Ca Oxalate Crys, UA PRESENT     Comment: Performed at [facility], [address] [place], [place] [address]  Lactic acid, plasma     Status: Abnormal   Collection Time: 11/30/18  5:45 PM  Result Value Ref Range   Lactic Acid, Venous 2.6 (HH) 0.5 - 1.9 mmol/L    Comment: CRITICAL RESULT CALLED TO, READ BACK BY AND VERIFIED WITH ALIVIA TAYLOR @1943  11/30/18 AKT Performed at [facility], [address] [place],  [address]   Blood culture (routine x 2)     Status: None (Preliminary result)   Collection Time: 11/30/18  5:46 PM  Result Value Ref Range   Specimen Description BLOOD    Special Requests NONE  Culture      NO GROWTH 2 DAYS Performed at [facility], [address] [place], [place] [address]    Report Status PENDING   Blood culture (routine x 2)     Status: None (Preliminary result)   Collection Time: 11/30/18  5:46 PM  Result Value Ref Range   Specimen Description BLOOD RIGHT ANTECUBITAL    Special Requests      BOTTLES DRAWN AEROBIC AND ANAEROBIC Blood Culture adequate volume   Culture      NO GROWTH 2 DAYS Performed at [facility], [address], [place], [place] [address]    Report Status PENDING   Influenza panel by PCR (type A & B)     Status: None   Collection Time: 11/30/18  7:18 PM  Result Value Ref Range   Influenza A By PCR NEGATIVE NEGATIVE   Influenza B By PCR NEGATIVE NEGATIVE    Comment: (NOTE) The Xpert Xpress Flu assay is intended as an aid in the diagnosis of  influenza and should not be used as a sole basis for treatment.  This  assay is FDA approved for nasopharyngeal swab specimens  only. Nasal  washings and aspirates are unacceptable for Xpert Xpress Flu testing. Performed at [facility], [address] [place], [place] [address]   Gastrointestinal Panel by PCR , Stool     Status: None   Collection Time: 12/01/18  5:05 AM  Result Value Ref Range   Campylobacter species NOT DETECTED NOT DETECTED   Plesimonas shigelloides NOT DETECTED NOT DETECTED   Salmonella species NOT DETECTED NOT DETECTED   Yersinia enterocolitica NOT DETECTED NOT DETECTED   Vibrio species NOT DETECTED NOT DETECTED   Vibrio cholerae NOT DETECTED NOT DETECTED   Enteroaggregative E coli (EAEC) NOT DETECTED NOT DETECTED   Enteropathogenic E coli (EPEC) NOT DETECTED NOT DETECTED   Enterotoxigenic E coli (ETEC) NOT DETECTED NOT DETECTED   Shiga like toxin producing E coli (STEC) NOT DETECTED NOT DETECTED   Shigella/Enteroinvasive E coli (EIEC) NOT DETECTED NOT DETECTED   Cryptosporidium NOT DETECTED NOT DETECTED   Cyclospora cayetanensis NOT DETECTED NOT DETECTED   Entamoeba histolytica NOT DETECTED NOT DETECTED   Giardia lamblia NOT DETECTED NOT DETECTED   Adenovirus F40/41 NOT DETECTED NOT DETECTED   Astrovirus NOT DETECTED NOT DETECTED   Norovirus GI/GII NOT DETECTED NOT DETECTED   Rotavirus A NOT DETECTED NOT DETECTED   Sapovirus (I, II, IV, and V) NOT DETECTED NOT DETECTED    Comment: Performed at [facility], [address] [place], [place] [address]  C difficile quick scan w PCR reflex     Status: Abnormal   Collection Time: 12/01/18  5:05 AM  Result Value Ref Range   C Diff antigen POSITIVE (A) NEGATIVE   C Diff toxin POSITIVE (A) NEGATIVE   C Diff interpretation Toxin producing C. difficile detected.     Comment: CRITICAL RESULT CALLED TO, READ BACK BY AND VERIFIED WITH: Liana Crocker AT 0618 ON 12/01/18 JJB Performed at [facility], [address] [place], [place] [address]   Comprehensive metabolic panel     Status: Abnormal   Collection Time:  12/01/18  5:37 AM  Result Value Ref Range   Sodium 128 (L) 135 - 145 mmol/L   Potassium 3.9 3.5 - 5.1 mmol/L   Chloride 96 (L) 98 - 111 mmol/L   CO2 21 (L) 22 - 32 mmol/L   Glucose, Bld 102 (H) 70 - 99  mg/dL   BUN 38 (H) 8 - 23 mg/dL   Creatinine, Ser 1.73 (H) 0.61 - 1.24 mg/dL   Calcium 7.2 (L) 8.9 - 10.3 mg/dL   Total Protein 4.6 (L) 6.5 - 8.1 g/dL   Albumin 2.0 (L) 3.5 - 5.0 g/dL   AST 30 15 - 41 U/L   ALT 35 0 - 44 U/L   Alkaline Phosphatase 130 (H) 38 - 126 U/L   Total Bilirubin 1.3 (H) 0.3 - 1.2 mg/dL   GFR calc non Af Amer 34 (L) >60 mL/min   GFR calc Af Amer 39 (L) >60 mL/min   Anion gap 11 5 - 15    Comment: Performed at [facility], [address] [place], [place] [address]  CBC     Status: Abnormal   Collection Time: 12/01/18  5:37 AM  Result Value Ref Range   WBC 43.4 (H) 4.0 - 10.5 K/uL   RBC 4.03 (L) 4.22 - 5.81 MIL/uL   Hemoglobin 10.4 (L) 13.0 - 17.0 g/dL   HCT 31.9 (L) 39.0 - 52.0 %   MCV 79.2 (L) 80.0 - 100.0 fL   MCH 25.8 (L) 26.0 - 34.0 pg   MCHC 32.6 30.0 - 36.0 g/dL   RDW 16.6 (H) 11.5 - 15.5 %   Platelets 636 (H) 150 - 400 K/uL   nRBC 0.1 0.0 - 0.2 %    Comment: Performed at [facility], [address] [place], [place] [address]  Lactic acid, plasma     Status: Abnormal   Collection Time: 12/01/18 12:23 PM  Result Value Ref Range   Lactic Acid, Venous 2.0 (HH) 0.5 - 1.9 mmol/L    Comment: CRITICAL RESULT CALLED TO, READ BACK BY AND VERIFIED WITH  BRANDY MANSFIELD AT 1305 12/01/18 SDR Performed at [facility], [address] [place], [place] [address]   Troponin I - Once     Status: None   Collection Time: 12/01/18 12:23 PM  Result Value Ref Range   Troponin I <0.03 <0.03 ng/mL    Comment: Performed at [facility], [address] [place], [place] [address]  CBC     Status: Abnormal   Collection Time: 12/02/18  3:32 AM  Result Value Ref Range   WBC 52.1 (HH) 4.0 - 10.5 K/uL    Comment: This critical  result has verified and been called to [facility] MILLER by Leanora Ivanoff on 02 21 2020 at 0459, and has been read back.    RBC 4.38 4.22 - 5.81 MIL/uL   Hemoglobin 11.1 (L) 13.0 - 17.0 g/dL   HCT 34.4 (L) 39.0 - 52.0 %   MCV 78.5 (L) 80.0 - 100.0 fL   MCH 25.3 (L) 26.0 - 34.0 pg   MCHC 32.3 30.0 - 36.0 g/dL   RDW 16.5 (H) 11.5 - 15.5 %   Platelets 686 (H) 150 - 400 K/uL   nRBC 0.2 0.0 - 0.2 %    Comment: Performed at [facility], [address] [place], [place] [address]  Basic metabolic panel     Status: Abnormal   Collection Time: 12/02/18  3:32 AM  Result Value Ref Range   Sodium 130 (L) 135 - 145 mmol/L   Potassium 4.3 3.5 - 5.1 mmol/L   Chloride 99 98 - 111 mmol/L   CO2 20 (L) 22 - 32 mmol/L   Glucose, Bld 113 (H) 70 - 99 mg/dL   BUN 53 (H) 8 - 23 mg/dL  Creatinine, Ser 2.18 (H) 0.61 - 1.24 mg/dL   Calcium 7.2 (L) 8.9 - 10.3 mg/dL   GFR calc non Af Amer 26 (L) >60 mL/min   GFR calc Af Amer 30 (L) >60 mL/min   Anion gap 11 5 - 15    Comment: Performed at [facility], [address], [place], [place] [address]  Pathologist smear review     Status: None   Collection Time: 12/02/18  3:32 AM  Result Value Ref Range   Path Review Peripheral blood smear is reviewed.     Comment: Absolute neutrophilia with mildly left shifted maturation, favor reactive. Microcytic, hypochromic anemia. Absolute thrombocytosis. Reviewed by Kathi Simpers, M.D. Performed at [facility], [address] [place], [place] [address]   Ferritin     Status: Abnormal   Collection Time: 12/02/18  3:32 AM  Result Value Ref Range   Ferritin 448 (H) 24 - 336 ng/mL    Comment: Performed at [facility], [address] [place], [place] [address]  Iron and TIBC     Status: Abnormal   Collection Time: 12/02/18  3:32 AM  Result Value Ref Range   Iron 13 (L) 45 - 182 ug/dL   TIBC 188 (L) 250 - 450 ug/dL   Saturation Ratios 7 (L) 17.9 - 39.5 %   UIBC 175 ug/dL     Comment: Performed at [facility], [address] [place], [place] [address]  Reticulocytes     Status: Abnormal   Collection Time: 12/02/18  3:32 AM  Result Value Ref Range   Retic Ct Pct 3.6 (H) 0.4 - 3.1 %   RBC. 4.35 4.22 - 5.81 MIL/uL   Retic Count, Absolute 154.9 19.0 - 186.0 K/uL   Immature Retic Fract 35.6 (H) 2.3 - 15.9 %    Comment: Performed at [facility], [address] [place], [place] [address]  CBC with Differential/Platelet     Status: Abnormal   Collection Time: 12/02/18  3:32 AM  Result Value Ref Range   WBC 52.5 (HH) 4.0 - 10.5 K/uL    Comment: CRITICAL VALUE NOTED.  VALUE IS CONSISTENT WITH PREVIOUSLY REPORTED AND CALLED VALUE.   RBC 4.32 4.22 - 5.81 MIL/uL   Hemoglobin 11.1 (L) 13.0 - 17.0 g/dL   HCT 34.2 (L) 39.0 - 52.0 %   MCV 79.2 (L) 80.0 - 100.0 fL   MCH 25.7 (L) 26.0 - 34.0 pg   MCHC 32.5 30.0 - 36.0 g/dL   RDW 16.8 (H) 11.5 - 15.5 %   Platelets 671 (H) 150 - 400 K/uL   nRBC 0.2 0.0 - 0.2 %   Neutrophils Relative % 87 %   Neutro Abs 45.9 (H) 1.7 - 7.7 K/uL   Lymphocytes Relative 2 %   Lymphs Abs 0.8 0.7 - 4.0 K/uL   Monocytes Relative 4 %   Monocytes Absolute 2.0 (H) 0.1 - 1.0 K/uL   Eosinophils Relative 0 %   Eosinophils Absolute 0.0 0.0 - 0.5 K/uL   Basophils Relative 0 %   Basophils Absolute 0.0 0.0 - 0.1 K/uL   WBC Morphology MILD LEFT SHIFT (1-5% METAS, OCC MYELO, OCC BANDS)    Smear Review Normal platelet morphology    Immature Granulocytes 7 %   Abs Immature Granulocytes 3.80 (H) 0.00 - 0.07 K/uL   Burr Cells PRESENT     Comment: Performed at [facility], [address] [place], [place] [address]   Ct Abdomen Pelvis Wo  Contrast  Result Date: 11/30/2018 CLINICAL DATA:  Fever with leukocytosis. Patient was undergoing rehabilitation for left femoral fracture. EXAM: CT ABDOMEN AND PELVIS WITHOUT CONTRAST TECHNIQUE: Multidetector CT imaging of the abdomen and pelvis was performed following the standard  protocol without IV contrast. COMPARISON:  None. FINDINGS: Lower chest: Cardiomegaly with coronary arteriosclerosis. No pericardial effusion. Small right effusion with right base atelectasis and peribronchial thickening/bronchiectasis. Hepatobiliary: No space-occupying mass. Biliary sludge noted of the moderately distended gallbladder. No mural thickening or pericholecystic fluid is seen. Pancreas: Slightly atrophic without ductal dilatation or mass. Spleen: Normal Adrenals/Urinary Tract: Stable bilateral adrenal nodules, nonspecific in etiology given lack of IV contrast enhancement. As measured similarly to prior, the right adrenal nodule measures up to 2.9 cm short axis and the left measures 2.1 cm. The kidneys demonstrate no significant cortical thinning, nephrolithiasis nor obstructive uropathy. Suprapubic catheter is noted within the urinary bladder which is decompressed in appearance and contains an air-fluid level likely from instrumentation. Stomach/Bowel: Small hiatal hernia. Decompressed stomach. The duodenal sweep and ligament of Treitz are normal. No small bowel obstruction or inflammation. Diffuse colonic transmural thickening is identified from cecum through rectum consistent with proctocolitis. The appendix is not confidently identified. Vascular/Lymphatic: Moderate aortoiliac and branch vessel atherosclerosis. No lymphadenopathy. Reproductive: Brachytherapy seeds noted of the prostate. Other: Small to moderate volume of free fluid is identified within the pelvis and outlining the liver and spleen. Musculoskeletal: Levoscoliosis of the lumbar spine with facet arthropathy. Osteoarthritis of both hips. Skin staples project over the left hip with where there is evidence of a left femoral nail fixation of the proximal femur. Subcutaneous postop hematomas are identified lateral to the left hip, the largest measuring 7.3 x 3.5 x 3.9 cm. IMPRESSION: 1. Diffuse colonic transmural thickening from cecum through  rectum consistent with proctocolitis. Small to moderate volume of free fluid is noted within the pelvis and outlining the liver and spleen. 2. Stable bilateral adrenal nodules, nonspecific in etiology given lack of IV contrast enhancement. 3. Small right pleural effusion with right base atelectasis and peribronchial thickening/bronchiectasis. 4. Brachytherapy seeds noted of the prostate. 5. Small postop subcutaneous hematomas overlying the left hip status post left femoral nail fixation. Electronically Signed   By: Ashley Royalty M.D.   On: 11/30/2018 18:25   Dg Chest 2 View  Result Date: 11/30/2018 CLINICAL DATA:  Fever. EXAM: CHEST - 2 VIEW COMPARISON:  Chest radiograph November 16, 2018 FINDINGS: Stable cardiomegaly. Ectatic versus aneurysmal aorta. Calcified aortic arch. Stable LEFT lung base scarring with volume loss and pleural thickening. RIGHT apical pleural thickening. No pneumothorax. Mild chronic interstitial changes. Osteopenia. High-riding humeral heads seen with old rotator cuff injuries. Scoliosis. IMPRESSION: Stable cardiomegaly and RIGHT lung base scarring/pleural thickening. Aortic Atherosclerosis (ICD10-I70.0). Electronically Signed   By: Elon Alas M.D.   On: 11/30/2018 18:10   US Venous Img Lower Bilateral  Result Date: 11/30/2018 CLINICAL DATA:  Lower extremity edema with history of recent surgery EXAM: BILATERAL LOWER EXTREMITY VENOUS DOPPLER ULTRASOUND TECHNIQUE: Gray-scale sonography with graded compression, as well as color Doppler and duplex ultrasound were performed to evaluate the lower extremity deep venous systems from the level of the common femoral vein and including the common femoral, femoral, profunda femoral, popliteal and calf veins including the posterior tibial, peroneal and gastrocnemius veins when visible. The superficial great saphenous vein was also interrogated. Spectral Doppler was utilized to evaluate flow at rest and with distal augmentation maneuvers in the  common femoral, femoral and popliteal veins. COMPARISON:  None. FINDINGS: RIGHT LOWER EXTREMITY Common Femoral Vein: No evidence of thrombus. Normal compressibility, respiratory phasicity and response to augmentation. Saphenofemoral Junction: No evidence of thrombus. Normal compressibility and flow on color Doppler imaging. Profunda Femoral Vein: No evidence of thrombus. Normal compressibility and flow on color Doppler imaging. Femoral Vein: No evidence of thrombus. Normal compressibility, respiratory phasicity and response to augmentation. Popliteal Vein: No evidence of thrombus. Normal compressibility, respiratory phasicity and response to augmentation. Calf Veins: Limited evaluation secondary to edema LEFT LOWER EXTREMITY Common Femoral Vein: No evidence of thrombus. Normal compressibility, respiratory phasicity and response to augmentation. Saphenofemoral Junction: No evidence of thrombus. Normal compressibility and flow on color Doppler imaging. Profunda Femoral Vein: No evidence of thrombus. Normal compressibility and flow on color Doppler imaging. Femoral Vein: No evidence of thrombus. Normal compressibility, respiratory phasicity and response to augmentation. Popliteal Vein: No evidence of thrombus. Normal compressibility, respiratory phasicity and response to augmentation. Calf Veins: Limited evaluation secondary to edema Cyst in the medial popliteal fossa measuring 3 x 1.6 by 1.9 cm. IMPRESSION: No evidence of deep venous thrombosis. Limited evaluation of calf vessels due to edema. 3 cm cyst in the left popliteal fossa Electronically Signed   By: Donavan Foil M.D.   On: 11/30/2018 19:21   US Venous Img Upper Uni Right  Result Date: 12/02/2018 CLINICAL DATA:  Right upper extremity edema. History of prostate carcinoma. EXAM: RIGHT UPPER EXTREMITY VENOUS DOPPLER ULTRASOUND TECHNIQUE: Gray-scale sonography with graded compression, as well as color Doppler and duplex ultrasound were performed to evaluate  the upper extremity deep venous system from the level of the subclavian vein and including the jugular, axillary, basilic, radial, ulnar and upper cephalic vein. Spectral Doppler was utilized to evaluate flow at rest and with distal augmentation maneuvers. COMPARISON:  None. FINDINGS: Contralateral Subclavian Vein: Respiratory phasicity is normal and symmetric with the symptomatic side. No evidence of thrombus. Normal compressibility. Internal Jugular Vein: No evidence of thrombus. Normal compressibility, respiratory phasicity and response to augmentation. Subclavian Vein: No evidence of thrombus. Normal compressibility, respiratory phasicity and response to augmentation. Axillary Vein: No evidence of thrombus. Normal compressibility, respiratory phasicity and response to augmentation. Cephalic Vein: No evidence of thrombus. Normal compressibility, respiratory phasicity and response to augmentation. Basilic Vein: No evidence of thrombus. Normal compressibility, respiratory phasicity and response to augmentation. Brachial Veins: No evidence of thrombus. Normal compressibility, respiratory phasicity and response to augmentation. Radial Veins: No evidence of thrombus. Normal compressibility, respiratory phasicity and response to augmentation. Ulnar Veins: No evidence of thrombus. Normal compressibility, respiratory phasicity and response to augmentation. Venous Reflux:  None visualized. Other Findings: No evidence of superficial thrombophlebitis or abnormal fluid collection. IMPRESSION: No evidence of DVT within the right upper extremity. Electronically Signed   By: Aletta Edouard M.D.   On: 12/02/2018 10:33    Assessment:  The patient is a 85 y.o. gentleman with marked leukocytosis felt secondary to infection and likely reactive thrombocytosis due to infection and possible iron deficiency.  He has C diff + diarrhea and is on oral vancomycin.  CBC on admission revealed a hematocrit of 31.9, hemoglobin 10.4, MCV  79.2, platelets 636,000, and WBC 43,400.  CBC today reveals a hematocrit of 34.2, hemoglobin 11.1, platelets 671,000, WBC 52,500 with an ANC of 45,00.  Retic was 3.6%.  Peripheral smear revealed absolute neutrophilia with mildly left shifted maturation, favor reactive. There was microcytic, hypochromic anemia and absolute thrombocytosis.  Symptomatically, he is fatigued.  Exam reveals significant edema.  Plan:   1.  Hematology  Marked leukocytosis felt reactive and due to C difficile infection.  Suspect WBC will improve over next several days.  Peripheral smear reveals neutrophilia and left shift.  No blasts.  Doubt myeloproliferative disorder.  Discuss work-up if does not resolve.  Thrombocytosis likely reactive and due to infection and possibly iron deficiency.  RBCs are microcytic. Iron saturation 7%.  Ferritin 448 (felt secondary to acute phase reactant).  Check sed rate, TSH, B12, folate, myeloma panel.   Thank you for allowing me to participate in Dionne Rossa. 's care.  I will follow him closely with you while hospitalized and after discharge in the outpatient department.   Lequita Asal, MD  12/02/2018, 2:16 PM

## 2018-12-03 LAB — BASIC METABOLIC PANEL
Anion gap: 10 (ref 5–15)
BUN: 67 mg/dL — ABNORMAL HIGH (ref 8–23)
CO2: 18 mmol/L — ABNORMAL LOW (ref 22–32)
Calcium: 6.9 mg/dL — ABNORMAL LOW (ref 8.9–10.3)
Chloride: 103 mmol/L (ref 98–111)
Creatinine, Ser: 2.3 mg/dL — ABNORMAL HIGH (ref 0.61–1.24)
GFR calc non Af Amer: 24 mL/min — ABNORMAL LOW (ref >60)
GFR, EST AFRICAN AMERICAN: 28 mL/min — AB (ref >60)
Glucose, Bld: 96 mg/dL (ref 70–99)
Potassium: 4.2 mmol/L (ref 3.5–5.1)
SODIUM: 131 mmol/L — AB (ref 135–145)

## 2018-12-03 LAB — CBC
HCT: 32.4 % — ABNORMAL LOW (ref 39.0–52.0)
Hemoglobin: 10.5 g/dL — ABNORMAL LOW (ref 13.0–17.0)
MCH: 25.5 pg — ABNORMAL LOW (ref 26.0–34.0)
MCHC: 32.4 g/dL (ref 30.0–36.0)
MCV: 78.6 fL — ABNORMAL LOW (ref 80.0–100.0)
Platelets: 605 10*3/uL — ABNORMAL HIGH (ref 150–400)
RBC: 4.12 MIL/uL — AB (ref 4.22–5.81)
RDW: 16.6 % — ABNORMAL HIGH (ref 11.5–15.5)
WBC: 43.8 10*3/uL — ABNORMAL HIGH (ref 4.0–10.5)
nRBC: 0.2 % (ref 0.0–0.2)

## 2018-12-03 LAB — SEDIMENTATION RATE: Sed Rate: 9 mm/hr (ref 0–20)

## 2018-12-03 LAB — FOLATE: Folate: 10.8 ng/mL (ref >5.9)

## 2018-12-03 LAB — TSH: TSH: 3.486 u[IU]/mL (ref 0.350–4.500)

## 2018-12-03 LAB — HEPARIN LEVEL (UNFRACTIONATED): Heparin Unfractionated: >3.6 IU/mL — ABNORMAL HIGH (ref 0.30–0.70)

## 2018-12-03 LAB — VITAMIN B12: Vitamin B-12: >7500 pg/mL — ABNORMAL HIGH (ref 180–914)

## 2018-12-03 MED ORDER — SODIUM CHLORIDE 0.9% FLUSH
3.0000 mL | INTRAVENOUS | Status: DC | PRN
  Administered 2018-12-03: 3 mL via INTRAVENOUS
  Filled 2018-12-03: qty 3

## 2018-12-03 MED ORDER — SODIUM CHLORIDE 0.9% FLUSH
3.0000 mL | Freq: Two times a day (BID) | INTRAVENOUS | Status: DC
  Administered 2018-12-03 – 2018-12-07 (×4): 3 mL via INTRAVENOUS

## 2018-12-03 MED ORDER — CIPROFLOXACIN HCL 500 MG PO TABS
250.0000 mg | ORAL_TABLET | Freq: Two times a day (BID) | ORAL | Status: AC
  Administered 2018-12-03 – 2018-12-05 (×5): 250 mg via ORAL
  Filled 2018-12-03 (×5): qty 1

## 2018-12-03 MED ORDER — CIPROFLOXACIN HCL 500 MG PO TABS: 500.0000 mg | ORAL_TABLET | Freq: Two times a day (BID) | ORAL | Status: DC

## 2018-12-03 MED ORDER — HEPARIN (PORCINE) 25000 UT/250ML-% IV SOLN
1200.0000 [IU]/h | INTRAVENOUS | Status: DC
  Administered 2018-12-03: 900 [IU]/h via INTRAVENOUS
  Administered 2018-12-04: 1050 [IU]/h via INTRAVENOUS
  Administered 2018-12-05: 22:00:00 900 [IU]/h via INTRAVENOUS
  Administered 2018-12-07: 02:00:00 1000 [IU]/h via INTRAVENOUS
  Filled 2018-12-03 (×4): qty 250

## 2018-12-03 MED ORDER — SODIUM CHLORIDE 0.9 % IV SOLN
INTRAVENOUS | Status: DC
  Administered 2018-12-03 – 2018-12-04 (×2): via INTRAVENOUS

## 2018-12-03 NOTE — Consult Note (Signed)
ORTHOPAEDIC CONSULTATION  REQUESTING PHYSICIAN: Nicholes Mango, MD  Chief Complaint:   Status post IM nailing of intertrochanteric left hip fracture.  History of Present Illness: Matthew Hicks. is a 85 y.o. male who is now over 2 weeks status post an intramedullary nailing of a displaced intertrochanteric left hip fracture by Dr. Marry Guan.  The patient's postoperative course has been complicated by the development of C. difficile, for which he has been readmitted.  The patient was scheduled to have his staples removed from his left hip and thigh yesterday, but this appointment was missed to to him being admitted.  Therefore, orthopedic consultation has been requested to assess the wounds and determine if his staples can be removed.  The patient denies any problems with his left hip or lower extremity, other than some soreness.  There has been no redness, erythema, or drainage from the wounds.  Therapy had been progressing well until he became weak and by the recent onset of his diarrhea.  Past Medical History:  Diagnosis Date  . A-fib ([place]) 04/08/2014  . Aortic stenosis   . Cardiomyopathy ([place])   . Cataract   . CVA (cerebral infarction) 2003  . Dysrhythmia   . GERD (gastroesophageal reflux disease)   . Hearing loss   . Hyperlipidemia   . Hypertension 05/04/2014  . Lung mass   . Macular degeneration   . Nephrolithiasis   . Pernicious anemia   . Pneumonia 05/2015  . Prostate cancer [facility]) 2007   performed by Dr. Eliberto Ivory  . Skin cancer   . Spinal stenosis   . Stroke [facility])    Past Surgical History:  Procedure Laterality Date  . CATARACT EXTRACTION    . CIRCUMCISION  1994  . CYSTOSCOPY  1946  . CYSTOSCOPY N/A 08/10/2018   Procedure: CYSTOSCOPY, superpubic catheter placement;  Surgeon: Royston Cowper, MD;  Location: [facility] ORS;  Service: Urology;  Laterality: N/A;  . CYSTOSCOPY W/ URETERAL STENT REMOVAL Left 06/15/2017    Procedure: CYSTOSCOPY WITH STENT REMOVAL, RETROGRADE, AND HOLMIUM LASER;  Surgeon: Royston Cowper, MD;  Location: [facility] ORS;  Service: Urology;  Laterality: Left;  . CYSTOSCOPY WITH STENT PLACEMENT Left 05/23/2017   Procedure: CYSTOSCOPY WITH STENT PLACEMENT;  Surgeon: Heron Sabins, MD;  Location: [facility] ORS;  Service: Urology;  Laterality: Left;  . INTRAMEDULLARY (IM) NAIL INTERTROCHANTERIC Left 11/18/2018   Procedure: INTRAMEDULLARY (IM) NAIL INTERTROCHANTRIC;  Surgeon: Dereck Leep, MD;  Location: [facility] ORS;  Service: Orthopedics;  Laterality: Left;  . laparoscopic prostectomy    . Spinal cyst removal    . TRANSURETHRAL RESECTION OF PROSTATE  2007   prostate cancer   Social History   Socioeconomic History  . Marital status: Married    Spouse name: Not on file  . Number of children: Not on file  . Years of education: Not on file  . Highest education level: Not on file  Occupational History  . Not on file  Social Needs  . Financial resource strain: Not on file  . Food insecurity:    Worry: Not on file    Inability: Not on file  . Transportation needs:    Medical: Not on file    Non-medical: Not on file  Tobacco Use  . Smoking status: Former Smoker    Packs/day: 1.00    Years: 40.00    Pack years: 40.00    Types: Cigarettes    Last attempt to quit: 06/08/1977    Years since quitting: 41.5  . Smokeless  tobacco: Never Used  . Tobacco comment: does not smoke now  Substance and Sexual Activity  . Alcohol use: No    Alcohol/week: 0.0 standard drinks  . Drug use: No  . Sexual activity: Not Currently  Lifestyle  . Physical activity:    Days per week: Not on file    Minutes per session: Not on file  . Stress: Not on file  Relationships  . Social connections:    Talks on phone: Not on file    Gets together: Not on file    Attends religious service: Not on file    Active member of club or organization: Not on file    Attends meetings of clubs or organizations: Not on file     Relationship status: Not on file  Other Topics Concern  . Not on file  Social History Narrative  . Not on file   Family History  Problem Relation Age of Onset  . Stroke Father        heart disease  . Pneumonia Father   . Alzheimer's disease Brother   . Colon cancer Daughter 47  . Cancer - Other Paternal Aunt 88       "GYN cancer"  . Cancer - Other Paternal Grandmother 34       "GYN cancer"   Allergies  Allergen Reactions  . Amoxicillin-Pot Clavulanate Swelling    Angioedema Has patient had a PCN reaction causing immediate rash, facial/tongue/throat swelling, SOB or lightheadedness with hypotension: Yes Has patient had a PCN reaction causing severe rash involving mucus membranes or skin necrosis: No Has patient had a PCN reaction that required hospitalization: No Has patient had a PCN reaction occurring within the last 10 years: Yes If all of the above answers are "NO", then may proceed with Cephalosporin use.   Marland Kitchen Fesoterodine Fumarate Er Hives and Rash  . Sulfa Antibiotics Rash   Prior to Admission medications   Medication Sig Start Date End Date Taking? Authorizing Provider  acetaminophen (TYLENOL) 650 MG CR tablet Take 650 mg by mouth every 8 (eight) hours as needed for pain.   Yes [provider]  amiodarone (PACERONE) 100 MG tablet Take 100 mg by mouth daily.   Yes [provider]  atorvastatin (LIPITOR) 20 MG tablet Take 20 mg by mouth daily.   Yes [provider]  Cyanocobalamin (B-12) 5000 MCG CAPS Take 5,000 mcg by mouth daily.    Yes [provider]  furosemide (LASIX) 20 MG tablet Take 20 mg by mouth daily.  09/11/15  Yes [provider]  levothyroxine (SYNTHROID, LEVOTHROID) 50 MCG tablet Take 50 mcg by mouth daily. 10/03/18 10/03/19 Yes [provider]  Multiple Vitamin (MULTIVITAMIN WITH MINERALS) TABS tablet Take 1 tablet by mouth daily.   Yes [provider]  Multiple Vitamins-Minerals (ICAPS)  CAPS Take 1 tablet by mouth daily.   Yes [provider]  rivaroxaban (XARELTO) 10 MG TABS tablet TAKE 1 TABLET(10 MG TOTAL) BY MOUTH DAILY WITH DINNER. 08/26/15  Yes [provider]  vitamin C (ASCORBIC ACID) 250 MG tablet Take 500 mg by mouth daily.   Yes [provider]  zinc sulfate 220 (50 Zn) MG capsule Take 220 mg by mouth daily.   Yes [provider]  azelastine (ASTELIN) 0.1 % nasal spray Place 2 sprays into the nose daily as needed for rhinitis or allergies.  11/14/15 06/07/17  [provider]  docusate sodium (COLACE) 100 MG capsule Take 100 mg by mouth 2 (  two) times daily.    [provider]  oxyCODONE (OXY IR/ROXICODONE) 5 MG immediate release tablet Take 1 tablet (5 mg total) by mouth every 4 (four) hours as needed for moderate pain (pain score 4-6). 11/20/18   Dustin Flock, MD  Polyethylene Glycol 3350 (PEG 3350) POWD Take 17 g by mouth daily.    [provider]   US Venous Img Upper Uni Right  Result Date: 12/02/2018 CLINICAL DATA:  Right upper extremity edema. History of prostate carcinoma. EXAM: RIGHT UPPER EXTREMITY VENOUS DOPPLER ULTRASOUND TECHNIQUE: Gray-scale sonography with graded compression, as well as color Doppler and duplex ultrasound were performed to evaluate the upper extremity deep venous system from the level of the subclavian vein and including the jugular, axillary, basilic, radial, ulnar and upper cephalic vein. Spectral Doppler was utilized to evaluate flow at rest and with distal augmentation maneuvers. COMPARISON:  None. FINDINGS: Contralateral Subclavian Vein: Respiratory phasicity is normal and symmetric with the symptomatic side. No evidence of thrombus. Normal compressibility. Internal Jugular Vein: No evidence of thrombus. Normal compressibility, respiratory phasicity and response to augmentation. Subclavian Vein: No evidence of thrombus. Normal compressibility, respiratory phasicity and response to  augmentation. Axillary Vein: No evidence of thrombus. Normal compressibility, respiratory phasicity and response to augmentation. Cephalic Vein: No evidence of thrombus. Normal compressibility, respiratory phasicity and response to augmentation. Basilic Vein: No evidence of thrombus. Normal compressibility, respiratory phasicity and response to augmentation. Brachial Veins: No evidence of thrombus. Normal compressibility, respiratory phasicity and response to augmentation. Radial Veins: No evidence of thrombus. Normal compressibility, respiratory phasicity and response to augmentation. Ulnar Veins: No evidence of thrombus. Normal compressibility, respiratory phasicity and response to augmentation. Venous Reflux:  None visualized. Other Findings: No evidence of superficial thrombophlebitis or abnormal fluid collection. IMPRESSION: No evidence of DVT within the right upper extremity. Electronically Signed   By: Aletta Edouard M.D.   On: 12/02/2018 10:33    Positive ROS: All other systems have been reviewed and were otherwise negative with the exception of those mentioned in the HPI and as above.  Physical Exam: General:  Alert, no acute distress Psychiatric:  Patient is competent for consent with normal mood and affect   Cardiovascular:  No pedal edema Respiratory:  No wheezing, non-labored breathing GI:  Abdomen is soft and non-tender Skin:  No lesions in the area of chief complaint Neurologic:  Sensation intact distally Lymphatic:  No axillary or cervical lymphadenopathy  Orthopedic Exam:  Orthopedic examination is limited to the left hip and thigh region.  His surgical wounds appear to be healing well and without evidence for infection.  There is no abnormal swelling, erythema, ecchymosis, or drainage from the wounds.  He is able to actively dorsiflex and plantarflex his toes and ankle.  Sensations intact light touch to all distributions.  He has good capillary refill to his left  foot.  Assessment: Status post intramedullary nailing of displaced intertrochanteric left hip fracture.  Plan: The treatment options have been explained to the patient and his wife, who is at the bedside.  I will ask the nurse to remove the staples and apply Steri-Strips when the patient is on his right side.  (The patient is on his left side now, so it would be awkward to get out the staples at this time.)  Thank you for asking me to participate in the care of this most pleasant man.  Please arrange for him to follow-up with Dr. Marry Guan in about 4 weeks, 6 weeks after the date  of his surgery.   Pascal Lux, MD  Beeper #:  [phone]  12/03/2018 1:47 PM

## 2018-12-03 NOTE — Consult Note (Signed)
PHARMACY NOTE:  ANTIMICROBIAL RENAL DOSAGE ADJUSTMENT  Current antimicrobial regimen includes a mismatch between antimicrobial dosage and estimated renal function.  As per policy approved by the Pharmacy & Therapeutics and Medical Executive Committees, the antimicrobial dosage will be adjusted accordingly.  Current antimicrobial dosage:  Ciprofloxacin 500mg  BID  Indication: Complicated UTI  Renal Function:  Estimated Creatinine Clearance: 18 mL/min (A) (by C-G formula based on SCr of 2.3 mg/dL (H)).     Antimicrobial dosage has been changed to:  Cipro 250mg  BID  Additional comments:  Thank you for allowing pharmacy to be a part of this patient's care.  Pernell Dupre, PharmD, BCPS Clinical Pharmacist 12/03/2018 3:27 PM

## 2018-12-03 NOTE — Clinical Social Work Note (Signed)
CSW received consult that patient is from [facility]. The patient has already been seen by CSW and has an assessment documented in the chart. CSW is following.  Matthew Hicks, MSW, Latanya Presser [phone]

## 2018-12-03 NOTE — Progress Notes (Signed)
Has had 2 loose medium watery,mucoid brown stools. Scrotum grossly edematous with cracks- barrier applied to perineum; starting antifungal powder. Generalized edema. IVF's discontinued. Generalized edema. Noted yellow serous drainage from left upper his incision with dsg change performed. Staples intact in both op sites- ortho consulted with orders to remove staples. Turned/repositioned every 1-2 hours with 2+. Family at bedside at frequent intervals. Denies co's;does admit to tenderness/soreness in left hip.

## 2018-12-03 NOTE — Progress Notes (Signed)
IVF's restarted per MD order. Heparin drip started after clarification with pharmacy.  Worsening generalized edema.  Chronic foley continued. Family updated on pt care/heparin therapy.

## 2018-12-03 NOTE — Progress Notes (Signed)
[place] at [place] NAME: Matthew Hicks    MR#:  [id number]  DATE OF BIRTH:  09/03/1925  SUBJECTIVE:   Patient had 2 semi-formed stools today.  2 sons at bedside reporting staples how to come out from the left hip surgery.  Patient reporting generalized weakness  REVIEW OF SYSTEMS:  Review of Systems  Constitutional: Negative for chills and fever.  HENT: Negative for congestion and sore throat.   Eyes: Negative for blurred vision and double vision.  Respiratory: Positive for shortness of breath. Negative for cough.   Cardiovascular: Negative for chest pain and palpitations.  Gastrointestinal: Positive for diarrhea. Negative for abdominal pain, nausea and vomiting.  Genitourinary: Negative for dysuria and urgency.  Musculoskeletal: Positive for joint pain. Negative for back pain and neck pain.  Neurological: Negative for dizziness and headaches.  Psychiatric/Behavioral: Negative for depression. The patient is not nervous/anxious.     DRUG ALLERGIES:   Allergies  Allergen Reactions  . Amoxicillin-Pot Clavulanate Swelling    Angioedema Has patient had a PCN reaction causing immediate rash, facial/tongue/throat swelling, SOB or lightheadedness with hypotension: Yes Has patient had a PCN reaction causing severe rash involving mucus membranes or skin necrosis: No Has patient had a PCN reaction that required hospitalization: No Has patient had a PCN reaction occurring within the last 10 years: Yes If all of the above answers are "NO", then may proceed with Cephalosporin use.   Marland Kitchen Fesoterodine Fumarate Er Hives and Rash  . Sulfa Antibiotics Rash   VITALS:  Blood pressure (!) 101/54, pulse 80, temperature (!) 97.5 F (36.4 C), temperature source Oral, resp. rate 20, height 5\' 5"  (1.651 m), weight 61 kg, SpO2 97 %. PHYSICAL EXAMINATION:  Physical Exam  GENERAL:  85 y.o.-year-old patient lying in the bed with no acute distress.   Tired-appearing, but nontoxic. EYES: Pupils equal, round, reactive to light and accommodation. No scleral icterus. Extraocular muscles intact.  HEENT: Head atraumatic, normocephalic. Oropharynx and nasopharynx clear.  NECK:  Supple, no jugular venous distention. No thyroid enlargement, no tenderness.  LUNGS: Normal breath sounds bilaterally, no wheezing, rales,rhonchi or crepitation. No use of accessory muscles of respiration.  CARDIOVASCULAR: Irregularly irregular rate, regular rhythm, S1, S2 normal. No murmurs, rubs, or gallops.  ABDOMEN: Soft, nontender, nondistended. Bowel sounds present.  +Suprapubic catheter appears normal, with some mild surrounding erythema EXTREMITIES: 1+ pitting edema to the mid shins bilaterally.  No cyanosis, or clubbing. + Right upper extremity edema. NEUROLOGIC: Awake alert and oriented x3. Sensation intact. Gait not checked.  PSYCHIATRIC: The patient is alert and oriented x 3. LABORATORY PANEL:  Male CBC Recent Labs  Lab 12/03/18 0509  WBC 43.8*  HGB 10.5*  HCT 32.4*  PLT 605*   ------------------------------------------------------------------------------------------------------------------ Chemistries  Recent Labs  Lab 12/01/18 0537  12/03/18 0509  NA 128*   < > 131*  K 3.9   < > 4.2  CL 96*   < > 103  CO2 21*   < > 18*  GLUCOSE 102*   < > 96  BUN 38*   < > 67*  CREATININE 1.73*   < > 2.30*  CALCIUM 7.2*   < > 6.9*  AST 30  --   --   ALT 35  --   --   ALKPHOS 130*  --   --   BILITOT 1.3*  --   --    < > = values in this interval not displayed.   RADIOLOGY:  No results found. ASSESSMENT AND PLAN:   C. difficile colitis- lactic acidosis has improved, but with worsening leukocytosis -Continue p.o. vancomycin -Continue IV fluids  Acute cystitis patient does have a suprapubic catheter. -No urine culture ordered on admission -urine culture-with Proteus mirabilis, sensitivity pending -[Ciprofloxacin   Leukocytosis-probably reactive as  well as from C. difficile colitis and acute cystitis Started trending down WBC 52.5-43.8 Hematology Dr. Mike Gip is following.  Patient to follow-up with her as an outpatient after discharge  Right upper extremity edema- new this morning -Negative right upper extremity Doppler ultrasound -rule out DVT -Continue Xarelto  Acute kidney injury- likely due to dehydration in the setting of diarrhea.  Creatinine worsening. 1.7-2.3 -Continue IV fluids, will ask pharmacy to renal dose all medications including vancomycin -Avoid nephrotoxic agents -Recheck creatinine in the morning -If creatinine worsens again tomorrow, would get nephrology consult  Elevated troponin- likely due to demand ischemia.  Resolved -Monitor   Hyponatremia- secondary to dehydration from diarrhea.  Sodium improving. -Continue IV fluids  Recent history of left intertrochanteric fracture -Pain control -Seen by orthopedics staples removed 12/03/2018 ,outpatient follow-up with Dr. Marry Guan in 4 weeks -PT consult  Microcytic anemia- likely due to iron deficiency -Anemia panel ordered -Hematology to see  Chronic atrial fibrillation-rate controlled -Continue Xarelto and amiodarone  Hypothyroidism-stable -Continue Synthroid  All the records are reviewed and case discussed with Care Management/Social Worker. Management plans discussed with the patient, family and they are in agreement.  CODE STATUS: DNR  TOTAL TIME TAKING CARE OF THIS PATIENT: 35  minutes.   More than 50% of the time was spent in counseling/coordination of care: YES  POSSIBLE D/C IN 2-3 DAYS, DEPENDING ON CLINICAL CONDITION.   Nicholes Mango M.D on 12/03/2018 at 2:38 PM  Between 7am to 6pm - Pager - [phone]  After 6pm go to [email or web address] - Technical brewer [facility]  Office  [phone]  CC: Primary care physician; Kirk Ruths, MD  Note: This dictation was prepared with Dragon dictation  along with smaller phrase technology. Any transcriptional errors that result from this process are unintentional.

## 2018-12-03 NOTE — Plan of Care (Signed)
  Problem: Education: Goal: Knowledge of General Education information will improve Description Including pain rating scale, medication(s)/side effects and non-pharmacologic comfort measures Outcome: Progressing   Problem: Health Behavior/Discharge Planning: Goal: Ability to manage health-related needs will improve Outcome: Progressing   Problem: Clinical Measurements: Goal: Ability to maintain clinical measurements within normal limits will improve Outcome: Progressing Goal: Will remain free from infection Outcome: Progressing Goal: Diagnostic test results will improve Outcome: Progressing Goal: Respiratory complications will improve Outcome: Progressing   Problem: Nutrition: Goal: Adequate nutrition will be maintained Outcome: Progressing   Problem: Elimination: Goal: Will not experience complications related to bowel motility Outcome: Progressing   Problem: Pain Managment: Goal: General experience of comfort will improve Outcome: Progressing   Problem: Safety: Goal: Ability to remain free from injury will improve Outcome: Progressing   Problem: Skin Integrity: Goal: Risk for impaired skin integrity will decrease Outcome: Progressing   Problem: Spiritual Needs Goal: Ability to function at adequate level Outcome: Progressing

## 2018-12-03 NOTE — Consult Note (Signed)
ANTICOAGULATION CONSULT NOTE - Initial Consult  Pharmacy Consult for heparin drip  Indication: atrial fibrillation  Allergies  Allergen Reactions  . Amoxicillin-Pot Clavulanate Swelling    Angioedema Has patient had a PCN reaction causing immediate rash, facial/tongue/throat swelling, SOB or lightheadedness with hypotension: Yes Has patient had a PCN reaction causing severe rash involving mucus membranes or skin necrosis: No Has patient had a PCN reaction that required hospitalization: No Has patient had a PCN reaction occurring within the last 10 years: Yes If all of the above answers are "NO", then may proceed with Cephalosporin use.   Marland Kitchen Fesoterodine Fumarate Er Hives and Rash  . Sulfa Antibiotics Rash    Patient Measurements: Height: 5\' 5"  (165.1 cm) Weight: 134 lb 7.7 oz (61 kg) IBW/kg (Calculated) : 61.5   Vital Signs: Temp: 97.5 F (36.4 C) (02/22 0752) Temp Source: Oral (02/22 0752) BP: 101/54 (02/22 0752) Pulse Rate: 80 (02/22 0752)  Labs: Recent Labs    11/30/18 1745 12/01/18 0537 12/01/18 1223 12/02/18 0332 12/03/18 0509  HGB 11.3* 10.4*  --  11.1*  11.1* 10.5*  HCT 35.2* 31.9*  --  34.2*  34.4* 32.4*  PLT 742* 636*  --  671*  686* 605*  CREATININE 1.58* 1.73*  --  2.18* 2.30*  TROPONINI 0.09*  --  <0.03  --   --     Estimated Creatinine Clearance: 18 mL/min (A) (by C-G formula based on SCr of 2.3 mg/dL (H)).   Medical History: Past Medical History:  Diagnosis Date  . A-fib ([place]) 04/08/2014  . Aortic stenosis   . Cardiomyopathy ([place])   . Cataract   . CVA (cerebral infarction) 2003  . Dysrhythmia   . GERD (gastroesophageal reflux disease)   . Hearing loss   . Hyperlipidemia   . Hypertension 05/04/2014  . Lung mass   . Macular degeneration   . Nephrolithiasis   . Pernicious anemia   . Pneumonia 05/2015  . Prostate cancer [facility]) 2007   performed by Dr. Eliberto Ivory  . Skin cancer   . Spinal stenosis   . Stroke [facility])     Medications:  Xarelto  15mg   Assessment: Pharmacy consulted for heparin drip dosing and monitoring in 85 yo male with PMH of A. Fib. Patient is currently ordered Rivaroxaban 15mg  daily. Due to increasing Scr and declining renal function patient will be transitioned to heparin infusion.  Last rivaroxaban dose was 2/21 @ 1723.  Goal of Therapy:  Heparin level 0.3-0.7 units/ml aPTT 66-102 seconds Monitor platelets by anticoagulation protocol: Yes   Plan:  Will not bolus   Start heparin infusion at 900 units/hr 24 hours after last rivaroxaban dose.  Will need to dose heparin based on aPTT levels until aPTT and anti-Xa levels correlate.  Check aPTT level in 8 hours and anti-Xa daily while on heparin Continue to monitor H&H and platelets  Pernell Dupre, PharmD, BCPS Clinical Pharmacist 12/03/2018 3:21 PM

## 2018-12-04 LAB — CBC
HCT: 32.8 % — ABNORMAL LOW (ref 39.0–52.0)
HEMOGLOBIN: 10.7 g/dL — AB (ref 13.0–17.0)
MCH: 25.3 pg — ABNORMAL LOW (ref 26.0–34.0)
MCHC: 32.6 g/dL (ref 30.0–36.0)
MCV: 77.5 fL — ABNORMAL LOW (ref 80.0–100.0)
Platelets: 562 10*3/uL — ABNORMAL HIGH (ref 150–400)
RBC: 4.23 MIL/uL (ref 4.22–5.81)
RDW: 16.5 % — ABNORMAL HIGH (ref 11.5–15.5)
WBC: 32.9 10*3/uL — ABNORMAL HIGH (ref 4.0–10.5)
nRBC: 0.2 % (ref 0.0–0.2)

## 2018-12-04 LAB — BASIC METABOLIC PANEL
Anion gap: 8 (ref 5–15)
BUN: 71 mg/dL — ABNORMAL HIGH (ref 8–23)
CHLORIDE: 103 mmol/L (ref 98–111)
CO2: 18 mmol/L — ABNORMAL LOW (ref 22–32)
Calcium: 6.6 mg/dL — ABNORMAL LOW (ref 8.9–10.3)
Creatinine, Ser: 2.05 mg/dL — ABNORMAL HIGH (ref 0.61–1.24)
GFR calc Af Amer: 32 mL/min — ABNORMAL LOW (ref >60)
GFR calc non Af Amer: 28 mL/min — ABNORMAL LOW (ref >60)
Glucose, Bld: 94 mg/dL (ref 70–99)
Potassium: 4 mmol/L (ref 3.5–5.1)
Sodium: 129 mmol/L — ABNORMAL LOW (ref 135–145)

## 2018-12-04 LAB — URINE CULTURE: Culture: >=100000 — AB

## 2018-12-04 LAB — OSMOLALITY, URINE: Osmolality, Ur: 601 mOsm/kg (ref 300–900)

## 2018-12-04 LAB — APTT
APTT: 103 s — AB (ref 24–36)
aPTT: 70 seconds — ABNORMAL HIGH (ref 24–36)
aPTT: 52 seconds — ABNORMAL HIGH (ref 24–36)

## 2018-12-04 LAB — SODIUM, URINE, RANDOM

## 2018-12-04 LAB — HEPARIN LEVEL (UNFRACTIONATED): Heparin Unfractionated: 2.54 IU/mL — ABNORMAL HIGH (ref 0.30–0.70)

## 2018-12-04 MED ORDER — ALBUMIN HUMAN 25 % IV SOLN
12.5000 g | Freq: Two times a day (BID) | INTRAVENOUS | Status: DC
  Administered 2018-12-04 – 2018-12-07 (×7): 12.5 g via INTRAVENOUS
  Filled 2018-12-04 (×8): qty 50

## 2018-12-04 NOTE — Progress Notes (Signed)
ANTICOAGULATION CONSULT NOTE - Initial Consult  Pharmacy Consult for Heparin  Indication: atrial fibrillation  Allergies  Allergen Reactions  . Amoxicillin-Pot Clavulanate Swelling    Angioedema Has patient had a PCN reaction causing immediate rash, facial/tongue/throat swelling, SOB or lightheadedness with hypotension: Yes Has patient had a PCN reaction causing severe rash involving mucus membranes or skin necrosis: No Has patient had a PCN reaction that required hospitalization: No Has patient had a PCN reaction occurring within the last 10 years: Yes If all of the above answers are "NO", then may proceed with Cephalosporin use.   Marland Kitchen Fesoterodine Fumarate Er Hives and Rash  . Sulfa Antibiotics Rash    Patient Measurements: Height: 5\' 5"  (165.1 cm) Weight: 134 lb 7.7 oz (61 kg) IBW/kg (Calculated) : 61.5 Heparin Dosing Weight:   Vital Signs: Temp: 98.1 F (36.7 C) (02/23 2002) Temp Source: Oral (02/23 2002) BP: 106/53 (02/23 2002) Pulse Rate: 88 (02/23 2002)  Labs: Recent Labs    12/02/18 0332 12/03/18 0509 12/03/18 1604 12/04/18 0200 12/04/18 0546 12/04/18 1021 12/04/18 1925  HGB 11.1*  11.1* 10.5*  --   --  10.7*  --   --   HCT 34.2*  34.4* 32.4*  --   --  32.8*  --   --   PLT 671*  686* 605*  --   --  562*  --   --   APTT  --   --   --  70*  --  52* 103*  HEPARINUNFRC  --   --  >3.60*  --  2.54*  --   --   CREATININE 2.18* 2.30*  --   --  2.05*  --   --     Estimated Creatinine Clearance: 20.3 mL/min (A) (by C-G formula based on SCr of 2.05 mg/dL (H)).   Medical History: Past Medical History:  Diagnosis Date  . A-fib ([place]) 04/08/2014  . Aortic stenosis   . Cardiomyopathy ([place])   . Cataract   . CVA (cerebral infarction) 2003  . Dysrhythmia   . GERD (gastroesophageal reflux disease)   . Hearing loss   . Hyperlipidemia   . Hypertension 05/04/2014  . Lung mass   . Macular degeneration   . Nephrolithiasis   . Pernicious anemia   . Pneumonia 05/2015   . Prostate cancer [facility]) 2007   performed by Dr. Eliberto Ivory  . Skin cancer   . Spinal stenosis   . Stroke [facility])     Medications:  Medications Prior to Admission  Medication Sig Dispense Refill Last Dose  . acetaminophen (TYLENOL) 650 MG CR tablet Take 650 mg by mouth every 8 (eight) hours as needed for pain.   11/30/2018 at 1303  . amiodarone (PACERONE) 100 MG tablet Take 100 mg by mouth daily.   11/30/2018 at 0923  . atorvastatin (LIPITOR) 20 MG tablet Take 20 mg by mouth daily.   11/29/2018 at 2003  . Cyanocobalamin (B-12) 5000 MCG CAPS Take 5,000 mcg by mouth daily.    11/30/2018 at 0923  . furosemide (LASIX) 20 MG tablet Take 20 mg by mouth daily.    11/30/2018 at 0923  . levothyroxine (SYNTHROID, LEVOTHROID) 50 MCG tablet Take 50 mcg by mouth daily.   11/30/2018 at 0923  . Multiple Vitamin (MULTIVITAMIN WITH MINERALS) TABS tablet Take 1 tablet by mouth daily.   11/30/2018 at 0923  . Multiple Vitamins-Minerals (ICAPS) CAPS Take 1 tablet by mouth daily.   11/30/2018 at 0923  . rivaroxaban (XARELTO) 10 MG  TABS tablet TAKE 1 TABLET(10 MG TOTAL) BY MOUTH DAILY WITH DINNER.   11/29/2018 at 2003  . vitamin C (ASCORBIC ACID) 250 MG tablet Take 500 mg by mouth daily.   11/30/2018 at 0923  . zinc sulfate 220 (50 Zn) MG capsule Take 220 mg by mouth daily.   11/30/2018 at 0923  . azelastine (ASTELIN) 0.1 % nasal spray Place 2 sprays into the nose daily as needed for rhinitis or allergies.    prn at prn  . docusate sodium (COLACE) 100 MG capsule Take 100 mg by mouth 2 (two) times daily.   prn at prn  . oxyCODONE (OXY IR/ROXICODONE) 5 MG immediate release tablet Take 1 tablet (5 mg total) by mouth every 4 (four) hours as needed for moderate pain (pain score 4-6). 30 tablet 0 11/28/2018 at 1920  . Polyethylene Glycol 3350 (PEG 3350) POWD Take 17 g by mouth daily.   prn at prn    Assessment: Pharmacy consulted for heparin drip dosing and monitoring in 85 yo male with PMH of A. Fib. Patient is currently ordered  Rivaroxaban 15mg  daily. Due to increasing Scr and declining renal function patient will be transitioned to heparin infusion.  Last rivaroxaban dose was 2/21 @ 1723.  2/22 heparin infusion Started @ 900 units/hr 2/23 @ 0200 aPTT 70 2/23 @ 1021 aPTT 52  Goal of Therapy:  Heparin level 0.3-0.7 units/ml aPTT 66-102 seconds Monitor platelets by anticoagulation protocol: Yes   Plan:  2/23 @ 1021 aPTT 52. Level is subtherapeutic. Will increase heparin infusion to 1050 units/hr. Recheck aPTT in 8 hours.   Will continue to adjust heparin infusion by aPTT levels until anti-Xa (Heparin level) and aPTT correlate.   2/23: aPTT @ 1925 = 103  Will adjust heparin gtt rate to 1000 units/hr and recheck aPTT 8 hrs after rate change.   Anti-Xa level and CBC ordered daily with AM labs per protocol.  Ory Elting D 12/04/2018,8:37 PM

## 2018-12-04 NOTE — Progress Notes (Signed)
Weaned 02 down to 5l/[place] with pt tolerating well. Generalized edema increased; generalized weakness. IVF's discontinued. Albumin IV x 1 given. Tramadol x 1 for generalized discomfort with observed pt more relaxed. Family in and supportive of pt. Heparin drip continued with no sign/symptom bleeding. Skin care performed.  MSAD of perineum/scrotum cared for. Less yellow serous drainage for left hip op site.

## 2018-12-04 NOTE — Consult Note (Signed)
ANTICOAGULATION CONSULT NOTE - Initial Consult  Pharmacy Consult for heparin drip  Indication: atrial fibrillation  Allergies  Allergen Reactions  . Amoxicillin-Pot Clavulanate Swelling    Angioedema Has patient had a PCN reaction causing immediate rash, facial/tongue/throat swelling, SOB or lightheadedness with hypotension: Yes Has patient had a PCN reaction causing severe rash involving mucus membranes or skin necrosis: No Has patient had a PCN reaction that required hospitalization: No Has patient had a PCN reaction occurring within the last 10 years: Yes If all of the above answers are "NO", then may proceed with Cephalosporin use.   Marland Kitchen Fesoterodine Fumarate Er Hives and Rash  . Sulfa Antibiotics Rash    Patient Measurements: Height: 5\' 5"  (165.1 cm) Weight: 134 lb 7.7 oz (61 kg) IBW/kg (Calculated) : 61.5   Vital Signs: Temp: 98 F (36.7 C) (02/22 2016) Temp Source: Oral (02/22 2016) BP: 99/65 (02/22 2016) Pulse Rate: 94 (02/22 2016)  Labs: Recent Labs    12/01/18 0537 12/01/18 1223 12/02/18 0332 12/03/18 0509 12/03/18 1604 12/04/18 0200  HGB 10.4*  --  11.1*  11.1* 10.5*  --   --   HCT 31.9*  --  34.2*  34.4* 32.4*  --   --   PLT 636*  --  671*  686* 605*  --   --   APTT  --   --   --   --   --  70*  HEPARINUNFRC  --   --   --   --  >3.60*  --   CREATININE 1.73*  --  2.18* 2.30*  --   --   TROPONINI  --  <0.03  --   --   --   --     Estimated Creatinine Clearance: 18 mL/min (A) (by C-G formula based on SCr of 2.3 mg/dL (H)).   Medical History: Past Medical History:  Diagnosis Date  . A-fib ([place]) 04/08/2014  . Aortic stenosis   . Cardiomyopathy ([place])   . Cataract   . CVA (cerebral infarction) 2003  . Dysrhythmia   . GERD (gastroesophageal reflux disease)   . Hearing loss   . Hyperlipidemia   . Hypertension 05/04/2014  . Lung mass   . Macular degeneration   . Nephrolithiasis   . Pernicious anemia   . Pneumonia 05/2015  . Prostate cancer [facility])  2007   performed by Dr. Eliberto Ivory  . Skin cancer   . Spinal stenosis   . Stroke [facility])     Medications:  Xarelto 15mg   Assessment: Pharmacy consulted for heparin drip dosing and monitoring in 85 yo male with PMH of A. Fib. Patient is currently ordered Rivaroxaban 15mg  daily. Due to increasing Scr and declining renal function patient will be transitioned to heparin infusion.  Last rivaroxaban dose was 2/21 @ 1723.  Goal of Therapy:  Heparin level 0.3-0.7 units/ml aPTT 66-102 seconds Monitor platelets by anticoagulation protocol: Yes   Plan:  Will not bolus   Start heparin infusion at 900 units/hr 24 hours after last rivaroxaban dose.  Will need to dose heparin based on aPTT levels until aPTT and anti-Xa levels correlate.  Check aPTT level in 8 hours and anti-Xa daily while on heparin Continue to monitor H&H and platelets  2/23 0200 aPTT 70. Continue current regimen. Recheck aPTT, heparin level, and CBC with tomorrow AM labs.  Eloise Harman, PharmD, BCPS Clinical Pharmacist 12/04/2018 5:24 AM

## 2018-12-04 NOTE — Consult Note (Signed)
ANTICOAGULATION CONSULT NOTE - Initial Consult  Pharmacy Consult for heparin drip  Indication: atrial fibrillation  Allergies  Allergen Reactions  . Amoxicillin-Pot Clavulanate Swelling    Angioedema Has patient had a PCN reaction causing immediate rash, facial/tongue/throat swelling, SOB or lightheadedness with hypotension: Yes Has patient had a PCN reaction causing severe rash involving mucus membranes or skin necrosis: No Has patient had a PCN reaction that required hospitalization: No Has patient had a PCN reaction occurring within the last 10 years: Yes If all of the above answers are "NO", then may proceed with Cephalosporin use.   Marland Kitchen Fesoterodine Fumarate Er Hives and Rash  . Sulfa Antibiotics Rash    Patient Measurements: Height: 5\' 5"  (165.1 cm) Weight: 134 lb 7.7 oz (61 kg) IBW/kg (Calculated) : 61.5   Vital Signs: Temp: 97.4 F (36.3 C) (02/23 1043) Temp Source: Oral (02/23 1043) BP: 92/57 (02/23 0535) Pulse Rate: 90 (02/23 1043)  Labs: Recent Labs    12/01/18 1223  12/02/18 0332 12/03/18 0509 12/03/18 1604 12/04/18 0200 12/04/18 0546 12/04/18 1021  HGB  --    < > 11.1*  11.1* 10.5*  --   --  10.7*  --   HCT  --   --  34.2*  34.4* 32.4*  --   --  32.8*  --   PLT  --   --  671*  686* 605*  --   --  562*  --   APTT  --   --   --   --   --  70*  --  52*  HEPARINUNFRC  --   --   --   --  >3.60*  --  2.54*  --   CREATININE  --   --  2.18* 2.30*  --   --  2.05*  --   TROPONINI <0.03  --   --   --   --   --   --   --    < > = values in this interval not displayed.    Estimated Creatinine Clearance: 20.3 mL/min (A) (by C-G formula based on SCr of 2.05 mg/dL (H)).   Medical History: Past Medical History:  Diagnosis Date  . A-fib ([place]) 04/08/2014  . Aortic stenosis   . Cardiomyopathy ([place])   . Cataract   . CVA (cerebral infarction) 2003  . Dysrhythmia   . GERD (gastroesophageal reflux disease)   . Hearing loss   . Hyperlipidemia   . Hypertension  05/04/2014  . Lung mass   . Macular degeneration   . Nephrolithiasis   . Pernicious anemia   . Pneumonia 05/2015  . Prostate cancer [facility]) 2007   performed by Dr. Eliberto Ivory  . Skin cancer   . Spinal stenosis   . Stroke [facility])     Medications:  Xarelto 15mg   Assessment: Pharmacy consulted for heparin drip dosing and monitoring in 85 yo male with PMH of A. Fib. Patient is currently ordered Rivaroxaban 15mg  daily. Due to increasing Scr and declining renal function patient will be transitioned to heparin infusion.  Last rivaroxaban dose was 2/21 @ 1723.  2/22 heparin infusion Started @ 900 units/hr 2/23 @ 0200 aPTT 70 2/23 @ 1021 aPTT 52  Goal of Therapy:  Heparin level 0.3-0.7 units/ml aPTT 66-102 seconds Monitor platelets by anticoagulation protocol: Yes   Plan:  2/23 @ 1021 aPTT 52. Level is subtherapeutic. Will increase heparin infusion to 1050 units/hr. Recheck aPTT in 8 hours.   Will continue to adjust heparin  infusion by aPTT levels until anti-Xa (Heparin level) and aPTT correlate.   Anti-Xa level and CBC ordered daily with AM labs per protocol.   Pernell Dupre, PharmD, BCPS Clinical Pharmacist 12/04/2018 11:29 AM

## 2018-12-04 NOTE — Progress Notes (Signed)
[facility] Hematology/Oncology Progress Note  Date of admission: 11/30/2018  Hospital day:  12/04/2018  Chief Complaint: Matthew Hicks. is a 85 y.o. male with abdominal pain, fever, and leukocytosis who was admitted through the emergency room with proctocolitis.  Subjective: Patient has not gotten up out of bed.  He is trying to eat more regular food.  Diarrhea has improved.  Social History: The patient is accompanied by his son, and 2 other family members/friends today.  Allergies:  Allergies  Allergen Reactions  . Amoxicillin-Pot Clavulanate Swelling    Angioedema Has patient had a PCN reaction causing immediate rash, facial/tongue/throat swelling, SOB or lightheadedness with hypotension: Yes Has patient had a PCN reaction causing severe rash involving mucus membranes or skin necrosis: No Has patient had a PCN reaction that required hospitalization: No Has patient had a PCN reaction occurring within the last 10 years: Yes If all of the above answers are "NO", then may proceed with Cephalosporin use.   Marland Kitchen Fesoterodine Fumarate Er Hives and Rash  . Sulfa Antibiotics Rash    Scheduled Medications: . amiodarone  100 mg Oral Daily  . ciprofloxacin  250 mg Oral BID  . levothyroxine  50 mcg Oral Q0600  . sodium chloride flush  3 mL Intravenous Q12H  . vancomycin  125 mg Oral QID    Review of Systems: GENERAL:  Feels weak.  He has not gotten out of bed.  No fevers, sweats. PERFORMANCE STATUS (ECOG):  3 HEENT:  Hard of hearing.  No visual changes, runny nose, sore throat, mouth sores or tenderness. Lungs: No shortness of breath or cough.  No hemoptysis. Cardiac:  No chest pain, palpitations, orthopnea, or PND. GI:  Oral intake has improved.  Less diarrhea.  No nausea, vomiting, constipation, melena or hematochezia. GU:  Suprapubic catheter.  No urgency, frequency, dysuria, or hematuria. Musculoskeletal:  No back pain.  No joint pain.  No muscle  tenderness. Extremities:  No pain.  Swelling in right upper > left upper extremity.  Ongoing bilateral lower extremity edema. Skin:  No rashes or skin changes. Neuro:  No headache, numbness or weakness, balance or coordination issues. Endocrine:  No diabetes.  Thyroid issues on Synthroid.  No hot flashes or night sweats. Psych:  No mood changes, depression or anxiety. Pain:  No focal pain. Review of systems:  All other systems reviewed and found to be negative.  Physical Exam: Blood pressure (!) 92/57, pulse 72, temperature (!) 97.5 F (36.4 C), temperature source Oral, resp. rate 20, height 5\' 5"  (1.651 m), weight 134 lb 7.7 oz (61 kg), SpO2 100 %.  GENERAL:  Elderly gentleman lying comfortably on the medical unit in no acute distress.  His son speaks for him. MENTAL STATUS:  Alert and cooperative. HEAD:  Gray/white hair.  Normocephalic, atraumatic, face symmetric, no Cushingoid features. EYES:  Glasses.  Pupils equal round and reactive to light and accomodation.  No conjunctivitis or scleral icterus. ENT:  Oropharynx clear without lesion.  Tongue normal. Mucous membranes dry.  RESPIRATORY:  Clear to auscultation without rales, wheezes or rhonchi. CARDIOVASCULAR:  Regular rate and rhythm without murmur, rub or gallop. ABDOMEN:  Soft, non-tender, with active bowel sounds, and no hepatosplenomegaly.  No masses. SKIN:  No rashes, ulcers or lesions. EXTREMITIES: Bilateral lower extremity 2-3+ edema.  Right upper extremity 3+ edema.  Left upper extremity 1+ edema.  No skin discoloration or tenderness.  No palpable cords. NEUROLOGICAL: Unremarkable.  Speaks with direct questioning. PSYCH:  Appropriate.  Results for orders placed or performed during the hospital encounter of 11/30/18 (from the past 48 hour(s))  Sedimentation rate     Status: None   Collection Time: 12/03/18  5:09 AM  Result Value Ref Range   Sed Rate 9 0 - 20 mm/hr    Comment: Performed at [facility], [address] [place], [place] [address]  Vitamin B12     Status: Abnormal   Collection Time: 12/03/18  5:09 AM  Result Value Ref Range   Vitamin B-12 >7,500 (H) 180 - 914 pg/mL    Comment: Performed at [facility], [place] [address] [place], [place] [address]  Folate     Status: None   Collection Time: 12/03/18  5:09 AM  Result Value Ref Range   Folate 10.8 >5.9 ng/mL    Comment: Performed at [facility], [address] [place], [place] [address]  TSH     Status: None   Collection Time: 12/03/18  5:09 AM  Result Value Ref Range   TSH 3.486 0.350 - 4.500 uIU/mL    Comment: Performed by a 3rd Generation assay with a functional sensitivity of <=0.01 uIU/mL. Performed at [facility], [address] [place], [place] [address]   CBC     Status: Abnormal   Collection Time: 12/03/18  5:09 AM  Result Value Ref Range   WBC 43.8 (H) 4.0 - 10.5 K/uL   RBC 4.12 (L) 4.22 - 5.81 MIL/uL   Hemoglobin 10.5 (L) 13.0 - 17.0 g/dL   HCT 32.4 (L) 39.0 - 52.0 %   MCV 78.6 (L) 80.0 - 100.0 fL   MCH 25.5 (L) 26.0 - 34.0 pg   MCHC 32.4 30.0 - 36.0 g/dL   RDW 16.6 (H) 11.5 - 15.5 %   Platelets 605 (H) 150 - 400 K/uL   nRBC 0.2 0.0 - 0.2 %    Comment: Performed at [facility], [address] [place], [place] [address]  Basic metabolic panel     Status: Abnormal   Collection Time: 12/03/18  5:09 AM  Result Value Ref Range   Sodium 131 (L) 135 - 145 mmol/L   Potassium 4.2 3.5 - 5.1 mmol/L   Chloride 103 98 - 111 mmol/L   CO2 18 (L) 22 - 32 mmol/L   Glucose, Bld 96 70 - 99 mg/dL   BUN 67 (H) 8 - 23 mg/dL   Creatinine, Ser 2.30 (H) 0.61 - 1.24 mg/dL   Calcium 6.9 (L) 8.9 - 10.3 mg/dL   GFR calc non Af Amer 24 (L) >60 mL/min   GFR calc Af Amer 28 (L) >60 mL/min   Anion gap 10 5 - 15    Comment: Performed at [facility], [place], [place] [address]  Heparin level (unfractionated)     Status: Abnormal   Collection Time: 12/03/18  4:04 PM   Result Value Ref Range   Heparin Unfractionated >3.60 (H) 0.30 - 0.70 IU/mL    Comment: RESULTS CONFIRMED BY MANUAL DILUTION (NOTE) If heparin results are below expected values, and patient dosage has  been confirmed, suggest follow up testing of antithrombin III levels. Performed at [facility], [address] [place], [place] [address]   APTT     Status: Abnormal   Collection Time: 12/04/18  2:00 AM  Result Value Ref Range   aPTT 70 (H) 24 - 36 seconds    Comment:  IF BASELINE aPTT IS ELEVATED, SUGGEST PATIENT RISK ASSESSMENT BE USED TO DETERMINE APPROPRIATE ANTICOAGULANT THERAPY. Performed at [facility], [address] [place], [place] [address]   Basic metabolic panel     Status: Abnormal   Collection Time: 12/04/18  5:46 AM  Result Value Ref Range   Sodium 129 (L) 135 - 145 mmol/L   Potassium 4.0 3.5 - 5.1 mmol/L   Chloride 103 98 - 111 mmol/L   CO2 18 (L) 22 - 32 mmol/L   Glucose, Bld 94 70 - 99 mg/dL   BUN 71 (H) 8 - 23 mg/dL   Creatinine, Ser 2.05 (H) 0.61 - 1.24 mg/dL   Calcium 6.6 (L) 8.9 - 10.3 mg/dL   GFR calc non Af Amer 28 (L) >60 mL/min   GFR calc Af Amer 32 (L) >60 mL/min   Anion gap 8 5 - 15    Comment: Performed at [facility], [address] [place], [place] [address]  Heparin level (unfractionated)     Status: Abnormal   Collection Time: 12/04/18  5:46 AM  Result Value Ref Range   Heparin Unfractionated 2.54 (H) 0.30 - 0.70 IU/mL    Comment: RESULTS CONFIRMED BY MANUAL DILUTION SDR Performed at [facility], [address] [place], [place] [address]   CBC     Status: Abnormal   Collection Time: 12/04/18  5:46 AM  Result Value Ref Range   WBC 32.9 (H) 4.0 - 10.5 K/uL   RBC 4.23 4.22 - 5.81 MIL/uL   Hemoglobin 10.7 (L) 13.0 - 17.0 g/dL   HCT 32.8 (L) 39.0 - 52.0 %   MCV 77.5 (L) 80.0 - 100.0 fL   MCH 25.3 (L) 26.0 - 34.0 pg   MCHC 32.6 30.0 - 36.0 g/dL   RDW 16.5 (H) 11.5 - 15.5 %    Platelets 562 (H) 150 - 400 K/uL   nRBC 0.2 0.0 - 0.2 %    Comment: Performed at [facility], [address], [place], [place] [address]   US Venous Img Upper Uni Right  Result Date: 12/02/2018 CLINICAL DATA:  Right upper extremity edema. History of prostate carcinoma. EXAM: RIGHT UPPER EXTREMITY VENOUS DOPPLER ULTRASOUND TECHNIQUE: Gray-scale sonography with graded compression, as well as color Doppler and duplex ultrasound were performed to evaluate the upper extremity deep venous system from the level of the subclavian vein and including the jugular, axillary, basilic, radial, ulnar and upper cephalic vein. Spectral Doppler was utilized to evaluate flow at rest and with distal augmentation maneuvers. COMPARISON:  None. FINDINGS: Contralateral Subclavian Vein: Respiratory phasicity is normal and symmetric with the symptomatic side. No evidence of thrombus. Normal compressibility. Internal Jugular Vein: No evidence of thrombus. Normal compressibility, respiratory phasicity and response to augmentation. Subclavian Vein: No evidence of thrombus. Normal compressibility, respiratory phasicity and response to augmentation. Axillary Vein: No evidence of thrombus. Normal compressibility, respiratory phasicity and response to augmentation. Cephalic Vein: No evidence of thrombus. Normal compressibility, respiratory phasicity and response to augmentation. Basilic Vein: No evidence of thrombus. Normal compressibility, respiratory phasicity and response to augmentation. Brachial Veins: No evidence of thrombus. Normal compressibility, respiratory phasicity and response to augmentation. Radial Veins: No evidence of thrombus. Normal compressibility, respiratory phasicity and response to augmentation. Ulnar Veins: No evidence of thrombus. Normal compressibility, respiratory phasicity and response to augmentation. Venous Reflux:  None visualized. Other Findings: No evidence of superficial thrombophlebitis or  abnormal fluid collection. IMPRESSION: No evidence of DVT within the right upper extremity. Electronically  Signed   By: Aletta Edouard M.D.   On: 12/02/2018 10:33    Assessment:  Matthew Hicks. is a 85 y.o. male with marked leukocytosis felt secondary to infection and likely reactive thrombocytosis due to infection and possible iron deficiency.  He has C diff + diarrhea and is on oral vancomycin.  CBC on admission revealed a hematocrit of 31.9, hemoglobin 10.4, MCV 79.2, platelets 636,000, and WBC 43,400.  CBC today reveals a hematocrit of 34.2, hemoglobin 11.1, platelets 671,000, WBC 52,500 with an ANC of 45,00.  Retic was 3.6%.  B12, folate, and TSH were normal.  Peripheral smear revealed absolute neutrophilia with mildly left shifted maturation, favor reactive. There was microcytic, hypochromic anemia and absolute thrombocytosis.  Symptomatically, his diarrhea has improved.  He remains bed bound.  WBC is 32,900.  Creatinine is 2.05.  Plan: 1.  Hematology             Marked leukocytosis felt reactive and due to C difficile infection.              WBC has decreased from 52,500 to 32,900.   Suspect WBC will continue to improve over next several days.              Peripheral smear reveals neutrophilia and left shift.  No blasts.              No evidence of a myeloproliferative disorder.  Discuss work-up if does not resolve.             Platelet count has improved from 742,000 to 562,000.   Thrombocytosis likely reactive and due to infection and possibly iron deficiency.  RBCs are microcytic. Iron saturation 7%.  Ferritin 448 (felt secondary to acute phase reactant).   Hemoglobin 10.7.  MCV 77.5.   Consider trial of oral iron.             Additional labs revealed a normal B12, folate, TSH.  Sed rate was 9.    Myeloma panel is pending. 2.  Disposition  Anticipate follow-up in the hematology clinic after discharge.   Lequita Asal, MD  12/04/2018, 8:59 AM

## 2018-12-04 NOTE — Consult Note (Signed)
Date: 12/04/2018                  Patient Name:  Matthew Hicks.  MRN: [id number]  DOB: Apr 13, 1925  Age / Sex: 85 y.o., male         PCP: Kirk Ruths, MD                 Service Requesting Consult: IM/ Nicholes Mango, MD                 Reason for Consult: ARF            History of Present Illness: Patient is a 85 y.o. male with medical problems of atrial fibrillation, aortic stenosis, history of stroke, chronic suprapubic catheter, who was admitted to [facility] on 11/30/2018.  [facility] for a suprapubic catheter exchange.  Patient is followed by Dr. Eliberto Ivory.  He returned on February 19 for fever of 100.5 Patient recently had a left intertrochanteric hip fracture and underwent surgical fixation on February 5. Patient was about to be discharged today but his labs shows decreasing sodium trended therefore nephrology consult has been requested for evaluation  Baseline sodium level is 136 noted on February 5.  Since then sodium level has been slightly low In addition, patient's creatinine increased to 2.30 from a baseline of 1.10 on February 5 Today's creatinine is starting to improve and is at 2.05 Urine output has been low.  Over last 24 hours, 600 cc reported which is an improvement from the day prior when it was 450 cc Urine culture from February 19 shows Proteus mirabilis GI panel from February 28 is positive for C. difficile toxin  Medications: Outpatient medications: Medications Prior to Admission  Medication Sig Dispense Refill Last Dose  . acetaminophen (TYLENOL) 650 MG CR tablet Take 650 mg by mouth every 8 (eight) hours as needed for pain.   11/30/2018 at 1303  . amiodarone (PACERONE) 100 MG tablet Take 100 mg by mouth daily.   11/30/2018 at 0923  . atorvastatin (LIPITOR) 20 MG tablet Take 20 mg by mouth daily.   11/29/2018 at 2003  . Cyanocobalamin (B-12) 5000 MCG CAPS Take 5,000 mcg by mouth daily.    11/30/2018 at 0923  .  furosemide (LASIX) 20 MG tablet Take 20 mg by mouth daily.    11/30/2018 at 0923  . levothyroxine (SYNTHROID, LEVOTHROID) 50 MCG tablet Take 50 mcg by mouth daily.   11/30/2018 at 0923  . Multiple Vitamin (MULTIVITAMIN WITH MINERALS) TABS tablet Take 1 tablet by mouth daily.   11/30/2018 at 0923  . Multiple Vitamins-Minerals (ICAPS) CAPS Take 1 tablet by mouth daily.   11/30/2018 at 0923  . rivaroxaban (XARELTO) 10 MG TABS tablet TAKE 1 TABLET(10 MG TOTAL) BY MOUTH DAILY WITH DINNER.   11/29/2018 at 2003  . vitamin C (ASCORBIC ACID) 250 MG tablet Take 500 mg by mouth daily.   11/30/2018 at 0923  . zinc sulfate 220 (50 Zn) MG capsule Take 220 mg by mouth daily.   11/30/2018 at 0923  . azelastine (ASTELIN) 0.1 % nasal spray Place 2 sprays into the nose daily as needed for rhinitis or allergies.    prn at prn  . docusate sodium (COLACE) 100 MG capsule Take 100 mg by mouth 2 (two) times daily.   prn at prn  . oxyCODONE (OXY IR/ROXICODONE) 5 MG immediate release tablet Take 1 tablet (5 mg total) by mouth every  4 (four) hours as needed for moderate pain (pain score 4-6). 30 tablet 0 11/28/2018 at 1920  . Polyethylene Glycol 3350 (PEG 3350) POWD Take 17 g by mouth daily.   prn at prn    Current medications: Current Facility-Administered Medications  Medication Dose Route Frequency Provider Last Rate Last Dose  . 0.9 %  sodium chloride infusion   Intravenous Continuous Gouru, Aruna, MD 75 mL/hr at 12/04/18 1000    . acetaminophen (TYLENOL) tablet 650 mg  650 mg Oral Q8H PRN Gouru, Aruna, MD      . amiodarone (PACERONE) tablet 100 mg  100 mg Oral Daily Gouru, Aruna, MD   100 mg at 12/04/18 1047  . azelastine (ASTELIN) 0.1 % nasal spray 2 spray  2 spray Each Nare Daily PRN Gouru, Aruna, MD      . ciprofloxacin (CIPRO) tablet 250 mg  250 mg Oral BID Hallaji, Sheema M, RPH   250 mg at 12/04/18 1047  . heparin ADULT infusion 100 units/mL (25000 units/230mL sodium chloride 0.45%)  900 Units/hr Intravenous Continuous  Hallaji, Sheema M, RPH 9 mL/hr at 12/04/18 1000 900 Units/hr at 12/04/18 1000  . levothyroxine (SYNTHROID, LEVOTHROID) tablet 50 mcg  50 mcg Oral Q0600 Milas Hock, RPH   50 mcg at 12/04/18 0532  . ondansetron (ZOFRAN) tablet 4 mg  4 mg Oral Q6H PRN Gouru, Aruna, MD       Or  . ondansetron (ZOFRAN) injection 4 mg  4 mg Intravenous Q6H PRN Gouru, Aruna, MD      . oxyCODONE (Oxy IR/ROXICODONE) immediate release tablet 5 mg  5 mg Oral Q4H PRN Mayo, Pete Pelt, MD      . sodium chloride flush (NS) 0.9 % injection 3 mL  3 mL Intravenous Q12H Gouru, Aruna, MD   3 mL at 12/03/18 0758  . sodium chloride flush (NS) 0.9 % injection 3 mL  3 mL Intravenous PRN Gouru, Aruna, MD   3 mL at 12/03/18 0758  . traMADol (ULTRAM) tablet 50 mg  50 mg Oral Q6H PRN Gouru, Aruna, MD   50 mg at 12/04/18 1047  . vancomycin (VANCOCIN) 50 mg/mL oral solution 125 mg  125 mg Oral QID Sela Hua, MD   125 mg at 12/04/18 1046      Allergies: Allergies  Allergen Reactions  . Amoxicillin-Pot Clavulanate Swelling    Angioedema Has patient had a PCN reaction causing immediate rash, facial/tongue/throat swelling, SOB or lightheadedness with hypotension: Yes Has patient had a PCN reaction causing severe rash involving mucus membranes or skin necrosis: No Has patient had a PCN reaction that required hospitalization: No Has patient had a PCN reaction occurring within the last 10 years: Yes If all of the above answers are "NO", then may proceed with Cephalosporin use.   Marland Kitchen Fesoterodine Fumarate Er Hives and Rash  . Sulfa Antibiotics Rash      Past Medical History: Past Medical History:  Diagnosis Date  . A-fib ([place]) 04/08/2014  . Aortic stenosis   . Cardiomyopathy ([place])   . Cataract   . CVA (cerebral infarction) 2003  . Dysrhythmia   . GERD (gastroesophageal reflux disease)   . Hearing loss   . Hyperlipidemia   . Hypertension 05/04/2014  . Lung mass   . Macular degeneration   . Nephrolithiasis   .  Pernicious anemia   . Pneumonia 05/2015  . Prostate cancer [facility]) 2007   performed by Dr. Eliberto Ivory  . Skin cancer   . Spinal stenosis   .  Stroke [facility])      Past Surgical History: Past Surgical History:  Procedure Laterality Date  . CATARACT EXTRACTION    . CIRCUMCISION  1994  . CYSTOSCOPY  1946  . CYSTOSCOPY N/A 08/10/2018   Procedure: CYSTOSCOPY, superpubic catheter placement;  Surgeon: Royston Cowper, MD;  Location: [facility] ORS;  Service: Urology;  Laterality: N/A;  . CYSTOSCOPY W/ URETERAL STENT REMOVAL Left 06/15/2017   Procedure: CYSTOSCOPY WITH STENT REMOVAL, RETROGRADE, AND HOLMIUM LASER;  Surgeon: Royston Cowper, MD;  Location: [facility] ORS;  Service: Urology;  Laterality: Left;  . CYSTOSCOPY WITH STENT PLACEMENT Left 05/23/2017   Procedure: CYSTOSCOPY WITH STENT PLACEMENT;  Surgeon: Heron Sabins, MD;  Location: [facility] ORS;  Service: Urology;  Laterality: Left;  . INTRAMEDULLARY (IM) NAIL INTERTROCHANTERIC Left 11/18/2018   Procedure: INTRAMEDULLARY (IM) NAIL INTERTROCHANTRIC;  Surgeon: Dereck Leep, MD;  Location: [facility] ORS;  Service: Orthopedics;  Laterality: Left;  . laparoscopic prostectomy    . Spinal cyst removal    . TRANSURETHRAL RESECTION OF PROSTATE  2007   prostate cancer     Family History: Family History  Problem Relation Age of Onset  . Stroke Father        heart disease  . Pneumonia Father   . Alzheimer's disease Brother   . Colon cancer Daughter 52  . Cancer - Other Paternal Aunt 48       "GYN cancer"  . Cancer - Other Paternal Grandmother 5       "GYN cancer"     Social History: Social History   Socioeconomic History  . Marital status: Married    Spouse name: Not on file  . Number of children: Not on file  . Years of education: Not on file  . Highest education level: Not on file  Occupational History  . Not on file  Social Needs  . Financial resource strain: Not on file  . Food insecurity:    Worry: Not on file    Inability: Not on file  .  Transportation needs:    Medical: Not on file    Non-medical: Not on file  Tobacco Use  . Smoking status: Former Smoker    Packs/day: 1.00    Years: 40.00    Pack years: 40.00    Types: Cigarettes    Last attempt to quit: 06/08/1977    Years since quitting: 41.5  . Smokeless tobacco: Never Used  . Tobacco comment: does not smoke now  Substance and Sexual Activity  . Alcohol use: No    Alcohol/week: 0.0 standard drinks  . Drug use: No  . Sexual activity: Not Currently  Lifestyle  . Physical activity:    Days per week: Not on file    Minutes per session: Not on file  . Stress: Not on file  Relationships  . Social connections:    Talks on phone: Not on file    Gets together: Not on file    Attends religious service: Not on file    Active member of club or organization: Not on file    Attends meetings of clubs or organizations: Not on file    Relationship status: Not on file  . Intimate partner violence:    Fear of current or ex partner: Not on file    Emotionally abused: Not on file    Physically abused: Not on file    Forced sexual activity: Not on file  Other Topics Concern  . Not on file  Social History Narrative  .  Not on file     Review of Systems: Patient is weak and fragile.  Not able to provide meaningful information.  All information is obtained from chart and patient's sons were at bedside Gen:  HEENT:  CV:  Resp:  GI: GU :  MS:  Derm:   Psych: Heme:  Neuro:  Endocrine  Vital Signs: Blood pressure (!) 92/57, pulse 90, temperature (!) 97.4 F (36.3 C), temperature source Oral, resp. rate 20, height 5\' 5"  (1.651 m), weight 61 kg, SpO2 100 %.   Intake/Output Summary (Last 24 hours) at 12/04/2018 1057 Last data filed at 12/04/2018 1000 Gross per 24 hour  Intake 1385.31 ml  Output 600 ml  Net 785.31 ml    Weight trends: [employer]   11/30/18 1732  Weight: 61 kg    Physical Exam: General:  Frail, elderly gentleman, laying in the bed   HEENT  decreased hearing, moist oral mucous membranes  Lungs:  Coarse breath sounds  Heart::  No rub, irregular  Abdomen:  Soft, nontender,  Extremities:  2-3+ peripheral edema  Neurologic:  Alert, able to follow simple commands  Skin:  No acute rashes  Foley:  Suprapubic catheter present       Lab results: Basic Metabolic Panel: Recent Labs  Lab 12/02/18 0332 12/03/18 0509 12/04/18 0546  NA 130* 131* 129*  K 4.3 4.2 4.0  CL 99 103 103  CO2 20* 18* 18*  GLUCOSE 113* 96 94  BUN 53* 67* 71*  CREATININE 2.18* 2.30* 2.05*  CALCIUM 7.2* 6.9* 6.6*    Liver Function Tests: Recent Labs  Lab 12/01/18 0537  AST 30  ALT 35  ALKPHOS 130*  BILITOT 1.3*  PROT 4.6*  ALBUMIN 2.0*   Recent Labs  Lab 11/30/18 1745  LIPASE 18   No results for input(s): AMMONIA in the last 168 hours.  CBC: Recent Labs  Lab 12/02/18 0332 12/03/18 0509 12/04/18 0546  WBC 52.5*  52.1* 43.8* 32.9*  NEUTROABS 45.9*  --   --   HGB 11.1*  11.1* 10.5* 10.7*  HCT 34.2*  34.4* 32.4* 32.8*  MCV 79.2*  78.5* 78.6* 77.5*  PLT 671*  686* 605* 562*    Cardiac Enzymes: Recent Labs  Lab 12/01/18 1223  TROPONINI <0.03    BNP: Invalid input(s): POCBNP  CBG: No results for input(s): GLUCAP in the last 168 hours.  Microbiology: Recent Results (from the past 720 hour(s))  Surgical PCR screen     Status: Abnormal   Collection Time: 11/17/18  1:23 PM  Result Value Ref Range Status   MRSA, PCR NEGATIVE NEGATIVE Final   Staphylococcus aureus POSITIVE (A) NEGATIVE Final    Comment: (NOTE) The Xpert SA Assay (FDA approved for NASAL specimens in patients 35 years of age and older), is one component of a comprehensive surveillance program. It is not intended to diagnose infection nor to guide or monitor treatment. Performed at [facility], [address] [place], [place] [address]   Urine Culture     Status: Abnormal   Collection Time: 11/30/18  5:45 PM  Result Value Ref  Range Status   Specimen Description   Final    URINE, RANDOM Performed at [facility], [address], [place], [place] [address]    Special Requests   Final    NONE Performed at [facility], [address] [place], [place] [address]    Culture >=100,000 COLONIES/mL PROTEUS MIRABILIS (A)  Final   Report Status 12/04/2018  FINAL  Final   Organism ID, Bacteria PROTEUS MIRABILIS (A)  Final      Susceptibility   Proteus mirabilis - MIC*    AMPICILLIN <=2 SENSITIVE Sensitive     CEFAZOLIN <=4 SENSITIVE Sensitive     CEFTRIAXONE <=1 SENSITIVE Sensitive     CIPROFLOXACIN 1 SENSITIVE Sensitive     GENTAMICIN <=1 SENSITIVE Sensitive     IMIPENEM 4 SENSITIVE Sensitive     NITROFURANTOIN 128 RESISTANT Resistant     TRIMETH/SULFA <=20 SENSITIVE Sensitive     AMPICILLIN/SULBACTAM <=2 SENSITIVE Sensitive     PIP/TAZO <=4 SENSITIVE Sensitive     * >=100,000 COLONIES/mL PROTEUS MIRABILIS  Blood culture (routine x 2)     Status: None (Preliminary result)   Collection Time: 11/30/18  5:46 PM  Result Value Ref Range Status   Specimen Description BLOOD  Final   Special Requests NONE  Final   Culture   Final    NO GROWTH 4 DAYS Performed at [facility], [address] [place], [place] [address]    Report Status PENDING  Incomplete  Blood culture (routine x 2)     Status: None (Preliminary result)   Collection Time: 11/30/18  5:46 PM  Result Value Ref Range Status   Specimen Description BLOOD RIGHT ANTECUBITAL  Final   Special Requests   Final    BOTTLES DRAWN AEROBIC AND ANAEROBIC Blood Culture adequate volume   Culture   Final    NO GROWTH 4 DAYS Performed at [facility], [address] [place], [place] [address]    Report Status PENDING  Incomplete  Gastrointestinal Panel by PCR , Stool     Status: None   Collection Time: 12/01/18  5:05 AM  Result Value Ref Range Status   Campylobacter species NOT DETECTED NOT DETECTED Final   Plesimonas  shigelloides NOT DETECTED NOT DETECTED Final   Salmonella species NOT DETECTED NOT DETECTED Final   Yersinia enterocolitica NOT DETECTED NOT DETECTED Final   Vibrio species NOT DETECTED NOT DETECTED Final   Vibrio cholerae NOT DETECTED NOT DETECTED Final   Enteroaggregative E coli (EAEC) NOT DETECTED NOT DETECTED Final   Enteropathogenic E coli (EPEC) NOT DETECTED NOT DETECTED Final   Enterotoxigenic E coli (ETEC) NOT DETECTED NOT DETECTED Final   Shiga like toxin producing E coli (STEC) NOT DETECTED NOT DETECTED Final   Shigella/Enteroinvasive E coli (EIEC) NOT DETECTED NOT DETECTED Final   Cryptosporidium NOT DETECTED NOT DETECTED Final   Cyclospora cayetanensis NOT DETECTED NOT DETECTED Final   Entamoeba histolytica NOT DETECTED NOT DETECTED Final   Giardia lamblia NOT DETECTED NOT DETECTED Final   Adenovirus F40/41 NOT DETECTED NOT DETECTED Final   Astrovirus NOT DETECTED NOT DETECTED Final   Norovirus GI/GII NOT DETECTED NOT DETECTED Final   Rotavirus A NOT DETECTED NOT DETECTED Final   Sapovirus (I, II, IV, and V) NOT DETECTED NOT DETECTED Final    Comment: Performed at [facility], [address] [place], [place] [address]  C difficile quick scan w PCR reflex     Status: Abnormal   Collection Time: 12/01/18  5:05 AM  Result Value Ref Range Status   C Diff antigen POSITIVE (A) NEGATIVE Final   C Diff toxin POSITIVE (A) NEGATIVE Final   C Diff interpretation Toxin producing C. difficile detected.  Final    Comment: CRITICAL RESULT CALLED TO, READ BACK BY AND VERIFIED WITH: Liana Crocker AT 0938 ON 12/01/18 JJB Performed at [facility],  [place], [place] [address]      Coagulation Studies: No results for input(s): LABPROT, INR in the last 72 hours.  Urinalysis: No results for input(s): COLORURINE, LABSPEC, PHURINE, GLUCOSEU, HGBUR, BILIRUBINUR, KETONESUR, PROTEINUR, UROBILINOGEN, NITRITE, LEUKOCYTESUR in the last 72 hours.  Invalid  input(s): APPERANCEUR      Imaging:  No results found.   Assessment & Plan: Pt is a 85 y.o. Caucasian  male with medical problems of atrial fibrillation, aortic stenosis, history of stroke, chronic suprapubic catheter, was admitted on 11/30/2018 with fever and is found to have Proteus UTI, C. difficile colitis, hyponatremia and acute renal failure.   1.  Acute renal failure, baseline creatinine of 1.17/GFR 54 from November 20, 2018 Patient has nonoliguric renal failure likely secondary to ATN.  Multiple factors including ongoing infections, hypotension, intravascular hypovolemia 2.  Hyponatremia, generalized edema Likely dilutional from total body volume overload TSH normal 3.  Urinary tract infection, C. difficile colitis -Antibiotics as per primary team   Plan: We will obtain urine studies Add IV albumin to improve intravascular volume.  Hopefully that will help with diuresis and volume optimization.  If urine output does not improve and respiratory status gets affected, may discontinue albumin We will follow       LOS: [place] 2/23/202010:57 AM  [place], [place]  Note: This note was prepared with Dragon dictation. Any transcription errors are unintentional

## 2018-12-04 NOTE — Progress Notes (Signed)
[place] [facility] [place] NAME: Matthew Hicks    MR#:  [id number]  DATE OF BIRTH:  January 30, 1925  SUBJECTIVE:   Patient had 2 semi-formed stools today.  Denies abdominal pain but decreased appetite 2 sons at bedside   REVIEW OF SYSTEMS:  Review of Systems  Constitutional: Negative for chills and fever.  HENT: Negative for congestion and sore throat.   Eyes: Negative for blurred vision and double vision.  Respiratory: Negative for cough and shortness of breath.   Cardiovascular: Negative for chest pain and palpitations.  Gastrointestinal: Negative for abdominal pain, diarrhea, nausea and vomiting.  Genitourinary: Negative for dysuria and urgency.  Musculoskeletal: Positive for joint pain. Negative for back pain and neck pain.  Neurological: Negative for dizziness and headaches.  Psychiatric/Behavioral: Negative for depression. The patient is not nervous/anxious.     DRUG ALLERGIES:   Allergies  Allergen Reactions  . Amoxicillin-Pot Clavulanate Swelling    Angioedema Has patient had a PCN reaction causing immediate rash, facial/tongue/throat swelling, SOB or lightheadedness with hypotension: Yes Has patient had a PCN reaction causing severe rash involving mucus membranes or skin necrosis: No Has patient had a PCN reaction that required hospitalization: No Has patient had a PCN reaction occurring within the last 10 years: Yes If all of the above answers are "NO", then may proceed with Cephalosporin use.   Marland Kitchen Fesoterodine Fumarate Er Hives and Rash  . Sulfa Antibiotics Rash   VITALS:  Blood pressure (!) 92/57, pulse 90, temperature (!) 97.4 F (36.3 C), temperature source Oral, resp. rate 20, height 5\' 5"  (1.651 m), weight 61 kg, SpO2 100 %. PHYSICAL EXAMINATION:  Physical Exam  GENERAL:  85 y.o.-year-old patient lying in the bed with no acute distress.  Tired-appearing, but nontoxic. EYES: Pupils equal, round, reactive to light and  accommodation. No scleral icterus. Extraocular muscles intact.  HEENT: Head atraumatic, normocephalic. Oropharynx and nasopharynx clear.  NECK:  Supple, no jugular venous distention. No thyroid enlargement, no tenderness.  LUNGS: Normal breath sounds bilaterally, no wheezing, rales,rhonchi or crepitation. No use of accessory muscles of respiration.  CARDIOVASCULAR: Irregularly irregular rate, regular rhythm, S1, S2 normal. No murmurs, rubs, or gallops.  ABDOMEN: Soft, nontender, nondistended. Bowel sounds present.  +Suprapubic catheter appears normal, with some mild surrounding erythema EXTREMITIES: 1+ pitting edema to the mid shins bilaterally.  No cyanosis, or clubbing. + Right upper extremity edema. NEUROLOGIC: Awake alert and oriented x3. Sensation intact. Gait not checked.  PSYCHIATRIC: The patient is alert and oriented x 3. LABORATORY PANEL:  Male CBC Recent Labs  Lab 12/04/18 0546  WBC 32.9*  HGB 10.7*  HCT 32.8*  PLT 562*   ------------------------------------------------------------------------------------------------------------------ Chemistries  Recent Labs  Lab 12/01/18 0537  12/04/18 0546  NA 128*   < > 129*  K 3.9   < > 4.0  CL 96*   < > 103  CO2 21*   < > 18*  GLUCOSE 102*   < > 94  BUN 38*   < > 71*  CREATININE 1.73*   < > 2.05*  CALCIUM 7.2*   < > 6.6*  AST 30  --   --   ALT 35  --   --   ALKPHOS 130*  --   --   BILITOT 1.3*  --   --    < > = values in this interval not displayed.   RADIOLOGY:  No results found. ASSESSMENT AND PLAN:   C. difficile colitis-  lactic acidosis has improved, but with worsening leukocytosis -Continue p.o. vancomycin -IV fluids provided  Acute cystitis patient does have a suprapubic catheter. -No urine culture ordered on admission -urine culture-with Proteus mirabilis, sensitive to ciprofloxacin    Leukocytosis-probably reactive as well as from C. difficile colitis and acute cystitis Started trending down WBC  52.5-43.8-32.8 Hematology Dr. Mike Gip is following.  Patient to follow-up with her as an outpatient after discharge Peripheral smear with neutrophilia and left shift  Right upper extremity edema- new this morning -Negative right upper extremity Doppler ultrasound -rule out DVT -Continue Xarelto  Acute kidney injury- likely due to dehydration in the setting of diarrhea.  Creatinine worsening. 1.7-2.3-2.05 -Provided IV fluids,  pharmacy  renal dosed all medications including vancomycin -Avoid nephrotoxic agents -Recheck creatinine in the morning -If creatinine worsens again tomorrow, would get nephrology consult  Elevated troponin- likely due to demand ischemia.  Resolved -Monitor   Hyponatremia- secondary to dehydration from diarrhea.  Sodium improving. -Given IV fluids Sodium-131-129 Dietary consult placed for p.o. poor p.o. intake Nephrology consult placed  Recent history of left intertrochanteric fracture -Pain control -Seen by orthopedics staples removed 12/03/2018 ,outpatient follow-up with Dr. Marry Guan in 4 weeks -PT consult  Microcytic anemia- likely due to iron deficiency -Anemia panel ordered -Hematology to see  Chronic atrial fibrillation-rate controlled -Continue amiodarone -Xarelto changed to IV heparin in view of acute renal insufficiency  Hypothyroidism-stable -Continue Synthroid  All the records are reviewed and case discussed with Care Management/Social Worker. Management plans discussed with the patient, family and they are in agreement.  CODE STATUS: DNR  TOTAL TIME TAKING CARE OF THIS PATIENT: 35  minutes.   More than 50% of the time was spent in counseling/coordination of care: YES  POSSIBLE D/C IN 2-3 DAYS, DEPENDING ON CLINICAL CONDITION.   Nicholes Mango M.D on 12/04/2018 at 2:46 PM  Between 7am to 6pm - Pager - [phone]  After 6pm go to [email or web address] - Technical brewer [facility]  Office   [phone]  CC: Primary care physician; Kirk Ruths, MD  Note: This dictation was prepared with Dragon dictation along with smaller phrase technology. Any transcriptional errors that result from this process are unintentional.

## 2018-12-05 ENCOUNTER — Inpatient Hospital Stay: Payer: Medicare Other

## 2018-12-05 LAB — CULTURE, BLOOD (ROUTINE X 2)
Culture: NO GROWTH
Culture: NO GROWTH
Special Requests: ADEQUATE

## 2018-12-05 LAB — BASIC METABOLIC PANEL
Anion gap: 10 (ref 5–15)
BUN: 72 mg/dL — ABNORMAL HIGH (ref 8–23)
CO2: 17 mmol/L — ABNORMAL LOW (ref 22–32)
Calcium: 6.7 mg/dL — ABNORMAL LOW (ref 8.9–10.3)
Chloride: 103 mmol/L (ref 98–111)
Creatinine, Ser: 2 mg/dL — ABNORMAL HIGH (ref 0.61–1.24)
GFR calc Af Amer: 33 mL/min — ABNORMAL LOW (ref >60)
GFR calc non Af Amer: 28 mL/min — ABNORMAL LOW (ref >60)
Glucose, Bld: 89 mg/dL (ref 70–99)
Potassium: 3.9 mmol/L (ref 3.5–5.1)
SODIUM: 130 mmol/L — AB (ref 135–145)

## 2018-12-05 LAB — CBC
HCT: 31.4 % — ABNORMAL LOW (ref 39.0–52.0)
Hemoglobin: 10 g/dL — ABNORMAL LOW (ref 13.0–17.0)
MCH: 25.4 pg — ABNORMAL LOW (ref 26.0–34.0)
MCHC: 31.8 g/dL (ref 30.0–36.0)
MCV: 79.7 fL — AB (ref 80.0–100.0)
Platelets: 490 10*3/uL — ABNORMAL HIGH (ref 150–400)
RBC: 3.94 MIL/uL — ABNORMAL LOW (ref 4.22–5.81)
RDW: 17.5 % — ABNORMAL HIGH (ref 11.5–15.5)
WBC: 23.3 10*3/uL — ABNORMAL HIGH (ref 4.0–10.5)
nRBC: 0.1 % (ref 0.0–0.2)

## 2018-12-05 LAB — APTT
APTT: 114 s — AB (ref 24–36)
aPTT: 73 seconds — ABNORMAL HIGH (ref 24–36)
aPTT: 86 seconds — ABNORMAL HIGH (ref 24–36)

## 2018-12-05 LAB — HEPARIN LEVEL (UNFRACTIONATED): Heparin Unfractionated: 0.97 IU/mL — ABNORMAL HIGH (ref 0.30–0.70)

## 2018-12-05 LAB — PHOSPHORUS: Phosphorus: 3.6 mg/dL (ref 2.5–4.6)

## 2018-12-05 MED ORDER — SODIUM CHLORIDE 0.9 % IV SOLN
INTRAVENOUS | Status: DC
  Administered 2018-12-05 – 2018-12-06 (×2): via INTRAVENOUS

## 2018-12-05 MED ORDER — ENSURE ENLIVE PO LIQD
237.0000 mL | Freq: Three times a day (TID) | ORAL | Status: DC
  Administered 2018-12-05 – 2018-12-07 (×6): 237 mL via ORAL

## 2018-12-05 NOTE — Progress Notes (Signed)
Initial Nutrition Assessment  DOCUMENTATION CODES:   Severe malnutrition in context of chronic illness  INTERVENTION:  Recommend liberalizing diet to regular.  Provide Ensure Enlive po TID, each supplement provides 350 kcal and 20 grams of protein.   Recommend measuring daily weights.  NUTRITION DIAGNOSIS:   Severe Malnutrition related to chronic illness(cardiomyopathy, advanced age) as evidenced by severe fat depletion, severe muscle depletion, moderate-severe edema.  GOAL:   Patient will meet greater than or equal to 90% of their needs  MONITOR:   PO intake, Supplement acceptance, Labs, Weight trends, Skin, I & O's  REASON FOR ASSESSMENT:   Consult Poor PO  ASSESSMENT:   85 year old male with PMHx of CVA 2003, HTN, A-fib, HLD, pernicious anemia, GERD, spinal stenosis, aortic stenosis, cardiomyopathy, hearing loss admitted with C. difficile colitis, acute cystitis, AKI, hyponatremia.   Met with patient at bedside. Could not get a very good nutrition or weight history. Patient is very hard of hearing and then his only answer to questions was "I don't know." No family members present. Finished lunch tray was present at bedside and patient had finished almost all of his grilled cheese (didn't eat crust) and then had some soda.   Weight history in chart is difficult to trend as many of the "weights" are copied forward from previous encounters and were not measured. It appears he was around 67-70 kg a few months ago. Then weight dropped to 61 kg on 2/5 and this weight has been copied forward for 3 encounters now. Due to significant edema present, RD suspects weight is currently falsely elevated, but is certainly higher than 61 kg with fluid on board.  Medications reviewed and include: amiodarone, Cipro, levothyroxine, vancomycin, human albumin 12.5 grams BID IV, heparin gtt.  Labs reviewed: Sodium 130, CO2 17, Creatinine 2.  NUTRITION - FOCUSED PHYSICAL EXAM:    Most Recent  Value  Orbital Region  Severe depletion  Upper Arm Region  Unable to assess [edema]  Thoracic and Lumbar Region  Severe depletion  Buccal Region  Severe depletion  Temple Region  Severe depletion  Clavicle Bone Region  Severe depletion  Clavicle and Acromion Bone Region  Severe depletion  Scapular Bone Region  Unable to assess  Dorsal Hand  Unable to assess [edema]  Patellar Region  Unable to assess [edema]  Anterior Thigh Region  Unable to assess [edema]  Posterior Calf Region  Unable to assess [edema]  Edema (RD Assessment)  Moderate [bilateral upper and lower extremities]  Hair  Reviewed  Eyes  Reviewed  Mouth  Unable to assess  Skin  Reviewed  Nails  Reviewed     Diet Order:   Diet Order            Diet Heart Room service appropriate? Yes; Fluid consistency: Thin  Diet effective now             EDUCATION NEEDS:   Not appropriate for education at this time  Skin:  Skin Assessment: Skin Integrity Issues:(stage II coccyx (1.5cm x 1cm))  Last BM:  12/05/2018 - large type 7  Height:   Ht Readings from Last 1 Encounters:  11/30/18 '5\' 5"'$  (1.651 m)   Weight:   Wt Readings from Last 1 Encounters:  11/30/18 61 kg   Ideal Body Weight:  61.8 kg  BMI:  Body mass index is 22.38 kg/m.  Estimated Nutritional Needs:   Kcal:  1400-1600  Protein:  70-80 grams  Fluid:  1.4-1.6 L/day  Willey Blade, MS,  RD, LDN Office: [phone] Pager: [phone] After Hours/Weekend Pager: [phone]  

## 2018-12-05 NOTE — Progress Notes (Signed)
[place] for Heparin  Indication: atrial fibrillation  Allergies  Allergen Reactions  . Amoxicillin-Pot Clavulanate Swelling    Angioedema Has patient had a PCN reaction causing immediate rash, facial/tongue/throat swelling, SOB or lightheadedness with hypotension: Yes Has patient had a PCN reaction causing severe rash involving mucus membranes or skin necrosis: No Has patient had a PCN reaction that required hospitalization: No Has patient had a PCN reaction occurring within the last 10 years: Yes If all of the above answers are "NO", then may proceed with Cephalosporin use.   Marland Kitchen Fesoterodine Fumarate Er Hives and Rash  . Sulfa Antibiotics Rash    Patient Measurements: Height: 5\' 5"  (165.1 cm) Weight: 134 lb 7.7 oz (61 kg) IBW/kg (Calculated) : 61.5 Heparin Dosing Weight:   Vital Signs: Temp: 98.5 F (36.9 C) (02/24 1949) Temp Source: Oral (02/24 1949) BP: 90/54 (02/24 1949) Pulse Rate: 72 (02/24 1949)  Labs: Recent Labs    12/03/18 0509 12/03/18 1604  12/04/18 0546  12/05/18 0626 12/05/18 1258 12/05/18 1901  HGB 10.5*  --   --  10.7*  --  10.0*  --   --   HCT 32.4*  --   --  32.8*  --  31.4*  --   --   PLT 605*  --   --  562*  --  490*  --   --   APTT  --   --    < >  --    < > 114* 73* 86*  HEPARINUNFRC  --  >3.60*  --  2.54*  --  0.97*  --   --   CREATININE 2.30*  --   --  2.05*  --  2.00*  --   --    < > = values in this interval not displayed.    Estimated Creatinine Clearance: 20.8 mL/min (A) (by C-G formula based on SCr of 2 mg/dL (H)).   Medical History: Past Medical History:  Diagnosis Date  . A-fib ([place]) 04/08/2014  . Aortic stenosis   . Cardiomyopathy ([place])   . Cataract   . CVA (cerebral infarction) 2003  . Dysrhythmia   . GERD (gastroesophageal reflux disease)   . Hearing loss   . Hyperlipidemia   . Hypertension 05/04/2014  . Lung mass   . Macular degeneration   . Nephrolithiasis   . Pernicious anemia    . Pneumonia 05/2015  . Prostate cancer [facility]) 2007   performed by Dr. Eliberto Ivory  . Skin cancer   . Spinal stenosis   . Stroke [facility])     Medications:  Medications Prior to Admission  Medication Sig Dispense Refill Last Dose  . acetaminophen (TYLENOL) 650 MG CR tablet Take 650 mg by mouth every 8 (eight) hours as needed for pain.   11/30/2018 at 1303  . amiodarone (PACERONE) 100 MG tablet Take 100 mg by mouth daily.   11/30/2018 at 0923  . atorvastatin (LIPITOR) 20 MG tablet Take 20 mg by mouth daily.   11/29/2018 at 2003  . Cyanocobalamin (B-12) 5000 MCG CAPS Take 5,000 mcg by mouth daily.    11/30/2018 at 0923  . furosemide (LASIX) 20 MG tablet Take 20 mg by mouth daily.    11/30/2018 at 0923  . levothyroxine (SYNTHROID, LEVOTHROID) 50 MCG tablet Take 50 mcg by mouth daily.   11/30/2018 at 0923  . Multiple Vitamin (MULTIVITAMIN WITH MINERALS) TABS tablet Take 1 tablet by mouth daily.   11/30/2018 at 0923  . Multiple  Vitamins-Minerals (ICAPS) CAPS Take 1 tablet by mouth daily.   11/30/2018 at 0923  . rivaroxaban (XARELTO) 10 MG TABS tablet TAKE 1 TABLET(10 MG TOTAL) BY MOUTH DAILY WITH DINNER.   11/29/2018 at 2003  . vitamin C (ASCORBIC ACID) 250 MG tablet Take 500 mg by mouth daily.   11/30/2018 at 0923  . zinc sulfate 220 (50 Zn) MG capsule Take 220 mg by mouth daily.   11/30/2018 at 0923  . azelastine (ASTELIN) 0.1 % nasal spray Place 2 sprays into the nose daily as needed for rhinitis or allergies.    prn at prn  . docusate sodium (COLACE) 100 MG capsule Take 100 mg by mouth 2 (two) times daily.   prn at prn  . oxyCODONE (OXY IR/ROXICODONE) 5 MG immediate release tablet Take 1 tablet (5 mg total) by mouth every 4 (four) hours as needed for moderate pain (pain score 4-6). 30 tablet 0 11/28/2018 at 1920  . Polyethylene Glycol 3350 (PEG 3350) POWD Take 17 g by mouth daily.   prn at prn    Assessment: Pharmacy consulted for heparin drip dosing and monitoring in 85 yo male with PMH of A. Fib. Patient is  currently ordered Rivaroxaban 15mg  daily. Due to increasing Scr and declining renal function patient will be transitioned to heparin infusion.  Last rivaroxaban dose was 2/21 @ 1723.  2/22 heparin infusion Started @ 900 units/hr 2/23 @ 0200 aPTT 70 (900 units/hour) 2/23 @ 1021 aPTT 52 (900 units/hour) 2/23 @ 1925 aPTT 103 (1050 units/hour) 2/24 @ 0626 aPTT 114 (1000 units/hr) heparin level 0.97 2/24 @ 1258 aPTT 73 (900 units/hr) - Therapeutic x 1  Goal of Therapy:  Heparin level 0.3-0.7 units/ml aPTT 66-102 seconds Monitor platelets by anticoagulation protocol: Yes   Plan:  2/24 @ 1901 aPTT 86 (900 units/hr) - Therapeutic x 2  Anti-Xa level and CBC ordered daily with AM labs per protocol.  - will order aPTT one more time in am and if aPTT/HL correlate, will d/c aPTT checks   Lu Duffel, PharmD, BCPS Clinical Pharmacist 12/05/2018 8:04 PM

## 2018-12-05 NOTE — Progress Notes (Signed)
[place] for Heparin  Indication: atrial fibrillation  Allergies  Allergen Reactions  . Amoxicillin-Pot Clavulanate Swelling    Angioedema Has patient had a PCN reaction causing immediate rash, facial/tongue/throat swelling, SOB or lightheadedness with hypotension: Yes Has patient had a PCN reaction causing severe rash involving mucus membranes or skin necrosis: No Has patient had a PCN reaction that required hospitalization: No Has patient had a PCN reaction occurring within the last 10 years: Yes If all of the above answers are "NO", then may proceed with Cephalosporin use.   Marland Kitchen Fesoterodine Fumarate Er Hives and Rash  . Sulfa Antibiotics Rash    Patient Measurements: Height: 5\' 5"  (165.1 cm) Weight: 134 lb 7.7 oz (61 kg) IBW/kg (Calculated) : 61.5 Heparin Dosing Weight:   Vital Signs: Temp: 97.3 F (36.3 C) (02/24 0452) Temp Source: Oral (02/24 0452) BP: 94/68 (02/24 0452) Pulse Rate: 83 (02/24 0452)  Labs: Recent Labs    12/03/18 0509 12/03/18 1604  12/04/18 0546  12/04/18 1925 12/05/18 0626 12/05/18 1258  HGB 10.5*  --   --  10.7*  --   --  10.0*  --   HCT 32.4*  --   --  32.8*  --   --  31.4*  --   PLT 605*  --   --  562*  --   --  490*  --   APTT  --   --    < >  --    < > 103* 114* 73*  HEPARINUNFRC  --  >3.60*  --  2.54*  --   --  0.97*  --   CREATININE 2.30*  --   --  2.05*  --   --  2.00*  --    < > = values in this interval not displayed.    Estimated Creatinine Clearance: 20.8 mL/min (A) (by C-G formula based on SCr of 2 mg/dL (H)).   Medical History: Past Medical History:  Diagnosis Date  . A-fib ([place]) 04/08/2014  . Aortic stenosis   . Cardiomyopathy ([place])   . Cataract   . CVA (cerebral infarction) 2003  . Dysrhythmia   . GERD (gastroesophageal reflux disease)   . Hearing loss   . Hyperlipidemia   . Hypertension 05/04/2014  . Lung mass   . Macular degeneration   . Nephrolithiasis   . Pernicious  anemia   . Pneumonia 05/2015  . Prostate cancer [facility]) 2007   performed by Dr. Eliberto Ivory  . Skin cancer   . Spinal stenosis   . Stroke [facility])     Medications:  Medications Prior to Admission  Medication Sig Dispense Refill Last Dose  . acetaminophen (TYLENOL) 650 MG CR tablet Take 650 mg by mouth every 8 (eight) hours as needed for pain.   11/30/2018 at 1303  . amiodarone (PACERONE) 100 MG tablet Take 100 mg by mouth daily.   11/30/2018 at 0923  . atorvastatin (LIPITOR) 20 MG tablet Take 20 mg by mouth daily.   11/29/2018 at 2003  . Cyanocobalamin (B-12) 5000 MCG CAPS Take 5,000 mcg by mouth daily.    11/30/2018 at 0923  . furosemide (LASIX) 20 MG tablet Take 20 mg by mouth daily.    11/30/2018 at 0923  . levothyroxine (SYNTHROID, LEVOTHROID) 50 MCG tablet Take 50 mcg by mouth daily.   11/30/2018 at 0923  . Multiple Vitamin (MULTIVITAMIN WITH MINERALS) TABS tablet Take 1 tablet by mouth daily.   11/30/2018 at 0923  . Multiple  Vitamins-Minerals (ICAPS) CAPS Take 1 tablet by mouth daily.   11/30/2018 at 0923  . rivaroxaban (XARELTO) 10 MG TABS tablet TAKE 1 TABLET(10 MG TOTAL) BY MOUTH DAILY WITH DINNER.   11/29/2018 at 2003  . vitamin C (ASCORBIC ACID) 250 MG tablet Take 500 mg by mouth daily.   11/30/2018 at 0923  . zinc sulfate 220 (50 Zn) MG capsule Take 220 mg by mouth daily.   11/30/2018 at 0923  . azelastine (ASTELIN) 0.1 % nasal spray Place 2 sprays into the nose daily as needed for rhinitis or allergies.    prn at prn  . docusate sodium (COLACE) 100 MG capsule Take 100 mg by mouth 2 (two) times daily.   prn at prn  . oxyCODONE (OXY IR/ROXICODONE) 5 MG immediate release tablet Take 1 tablet (5 mg total) by mouth every 4 (four) hours as needed for moderate pain (pain score 4-6). 30 tablet 0 11/28/2018 at 1920  . Polyethylene Glycol 3350 (PEG 3350) POWD Take 17 g by mouth daily.   prn at prn    Assessment: Pharmacy consulted for heparin drip dosing and monitoring in 85 yo male with PMH of A. Fib.  Patient is currently ordered Rivaroxaban 15mg  daily. Due to increasing Scr and declining renal function patient will be transitioned to heparin infusion.  Last rivaroxaban dose was 2/21 @ 1723.  2/22 heparin infusion Started @ 900 units/hr 2/23 @ 0200 aPTT 70 2/23 @ 1021 aPTT 52 2/23 @ 1021 aPTT 52. Level is subtherapeutic. Will increase heparin infusion to 1050 units/hr. Recheck aPTT in 8 hours.  Will continue to adjust heparin infusion by aPTT levels until anti-Xa (Heparin level) and aPTT correlate.  2/23: aPTT @ 1925 = 103  Will adjust heparin gtt rate to 1000 units/hr and recheck aPTT 8 hrs after rate change.  2/24 AM aPTT 114, heparin level 0.97. Decrease rate to 900 units/hr and recheck aPTT in 6 hours.  Goal of Therapy:  Heparin level 0.3-0.7 units/ml aPTT 66-102 seconds Monitor platelets by anticoagulation protocol: Yes   Plan:  2/24 1258 aptt=73. aptt within range. Will continue current rate and check confirmatory aPTT in 6 hours. F/u for correlation with Heparin level in am  Anti-Xa level and CBC ordered daily with AM labs per protocol.   Chinita Greenland PharmD Clinical Pharmacist 12/05/2018

## 2018-12-05 NOTE — Progress Notes (Signed)
Pharmacist note:  Patient w/ Cdiff+ on Vancomycin PO. Patient also on Cipro Day 3 for ?UTI. Patient with suprapubic catheter-suspect colonization. Catheter was changed out. Discontinue Cipro.  Chinita Greenland PharmD Clinical Pharmacist 12/05/2018

## 2018-12-05 NOTE — Progress Notes (Signed)
ANTICOAGULATION CONSULT NOTE - Initial Consult  Pharmacy Consult for Heparin  Indication: atrial fibrillation  Allergies  Allergen Reactions  . Amoxicillin-Pot Clavulanate Swelling    Angioedema Has patient had a PCN reaction causing immediate rash, facial/tongue/throat swelling, SOB or lightheadedness with hypotension: Yes Has patient had a PCN reaction causing severe rash involving mucus membranes or skin necrosis: No Has patient had a PCN reaction that required hospitalization: No Has patient had a PCN reaction occurring within the last 10 years: Yes If all of the above answers are "NO", then may proceed with Cephalosporin use.   Marland Kitchen Fesoterodine Fumarate Er Hives and Rash  . Sulfa Antibiotics Rash    Patient Measurements: Height: 5\' 5"  (165.1 cm) Weight: 134 lb 7.7 oz (61 kg) IBW/kg (Calculated) : 61.5 Heparin Dosing Weight:   Vital Signs: Temp: 97.3 F (36.3 C) (02/24 0452) Temp Source: Oral (02/24 0452) BP: 94/68 (02/24 0452) Pulse Rate: 83 (02/24 0452)  Labs: Recent Labs    12/03/18 0509 12/03/18 1604  12/04/18 0546 12/04/18 1021 12/04/18 1925 12/05/18 0626  HGB 10.5*  --   --  10.7*  --   --  10.0*  HCT 32.4*  --   --  32.8*  --   --  31.4*  PLT 605*  --   --  562*  --   --  490*  APTT  --   --    < >  --  52* 103* 114*  HEPARINUNFRC  --  >3.60*  --  2.54*  --   --  0.97*  CREATININE 2.30*  --   --  2.05*  --   --  2.00*   < > = values in this interval not displayed.    Estimated Creatinine Clearance: 20.8 mL/min (A) (by C-G formula based on SCr of 2 mg/dL (H)).   Medical History: Past Medical History:  Diagnosis Date  . A-fib ([place]) 04/08/2014  . Aortic stenosis   . Cardiomyopathy ([place])   . Cataract   . CVA (cerebral infarction) 2003  . Dysrhythmia   . GERD (gastroesophageal reflux disease)   . Hearing loss   . Hyperlipidemia   . Hypertension 05/04/2014  . Lung mass   . Macular degeneration   . Nephrolithiasis   . Pernicious anemia   .  Pneumonia 05/2015  . Prostate cancer [facility]) 2007   performed by Dr. Eliberto Ivory  . Skin cancer   . Spinal stenosis   . Stroke [facility])     Medications:  Medications Prior to Admission  Medication Sig Dispense Refill Last Dose  . acetaminophen (TYLENOL) 650 MG CR tablet Take 650 mg by mouth every 8 (eight) hours as needed for pain.   11/30/2018 at 1303  . amiodarone (PACERONE) 100 MG tablet Take 100 mg by mouth daily.   11/30/2018 at 0923  . atorvastatin (LIPITOR) 20 MG tablet Take 20 mg by mouth daily.   11/29/2018 at 2003  . Cyanocobalamin (B-12) 5000 MCG CAPS Take 5,000 mcg by mouth daily.    11/30/2018 at 0923  . furosemide (LASIX) 20 MG tablet Take 20 mg by mouth daily.    11/30/2018 at 0923  . levothyroxine (SYNTHROID, LEVOTHROID) 50 MCG tablet Take 50 mcg by mouth daily.   11/30/2018 at 0923  . Multiple Vitamin (MULTIVITAMIN WITH MINERALS) TABS tablet Take 1 tablet by mouth daily.   11/30/2018 at 0923  . Multiple Vitamins-Minerals (ICAPS) CAPS Take 1 tablet by mouth daily.   11/30/2018 at 0923  . rivaroxaban (  XARELTO) 10 MG TABS tablet TAKE 1 TABLET(10 MG TOTAL) BY MOUTH DAILY WITH DINNER.   11/29/2018 at 2003  . vitamin C (ASCORBIC ACID) 250 MG tablet Take 500 mg by mouth daily.   11/30/2018 at 0923  . zinc sulfate 220 (50 Zn) MG capsule Take 220 mg by mouth daily.   11/30/2018 at 0923  . azelastine (ASTELIN) 0.1 % nasal spray Place 2 sprays into the nose daily as needed for rhinitis or allergies.    prn at prn  . docusate sodium (COLACE) 100 MG capsule Take 100 mg by mouth 2 (two) times daily.   prn at prn  . oxyCODONE (OXY IR/ROXICODONE) 5 MG immediate release tablet Take 1 tablet (5 mg total) by mouth every 4 (four) hours as needed for moderate pain (pain score 4-6). 30 tablet 0 11/28/2018 at 1920  . Polyethylene Glycol 3350 (PEG 3350) POWD Take 17 g by mouth daily.   prn at prn    Assessment: Pharmacy consulted for heparin drip dosing and monitoring in 85 yo male with PMH of A. Fib. Patient is  currently ordered Rivaroxaban 15mg  daily. Due to increasing Scr and declining renal function patient will be transitioned to heparin infusion.  Last rivaroxaban dose was 2/21 @ 1723.  2/22 heparin infusion Started @ 900 units/hr 2/23 @ 0200 aPTT 70 2/23 @ 1021 aPTT 52  Goal of Therapy:  Heparin level 0.3-0.7 units/ml aPTT 66-102 seconds Monitor platelets by anticoagulation protocol: Yes   Plan:  2/23 @ 1021 aPTT 52. Level is subtherapeutic. Will increase heparin infusion to 1050 units/hr. Recheck aPTT in 8 hours.   Will continue to adjust heparin infusion by aPTT levels until anti-Xa (Heparin level) and aPTT correlate.   2/23: aPTT @ 1925 = 103  Will adjust heparin gtt rate to 1000 units/hr and recheck aPTT 8 hrs after rate change.   2/24 AM aPTT 114, heparin level 0.97. Decrease rate to 900 units/hr and recheck aPTT in 6 hours.  Anti-Xa level and CBC ordered daily with AM labs per protocol.   Sim Boast, PharmD, BCPS  12/05/18 7:01 AM

## 2018-12-05 NOTE — Progress Notes (Signed)
[facility]  ROUNDING NOTE   Subjective:  The patient's renal function is about the same today. BUN 72 with a creatinine of 2.0. Patient quite hard of hearing.   Objective:  Vital signs in last 24 hours:  Temp:  [97.3 F (36.3 C)-98.1 F (36.7 C)] 97.3 F (36.3 C) (02/24 0452) Pulse Rate:  [83-88] 83 (02/24 0452) Resp:  [16-20] 16 (02/24 0452) BP: (94-110)/(53-68) 94/68 (02/24 0452) SpO2:  [96 %-100 %] 98 % (02/24 0452)  Weight change:  Filed Weights   11/30/18 1732  Weight: 61 kg    Intake/Output: I/O last 3 completed shifts: In: 1770.9 [I.V.:1693.5; IV Piggyback:77.4] Out: 8185 [Urine:1175; Stool:1]   Intake/Output this shift:  No intake/output data recorded.  Physical Exam: General: No acute distress  Head: Normocephalic, atraumatic. Moist oral mucosal membranes  Eyes: Anicteric  Neck: Supple, trachea midline  Lungs:  Clear to auscultation, normal effort  Heart: S1S2 no rubs  Abdomen:  Soft, nontender, bowel sounds present  Extremities: 2+ peripheral edema.  Neurologic: Awake, alert, hard of hearing  Skin: LLE with erythema       Basic Metabolic Panel: Recent Labs  Lab 12/01/18 0537 12/02/18 0332 12/03/18 0509 12/04/18 0546 12/05/18 0626  NA 128* 130* 131* 129* 130*  K 3.9 4.3 4.2 4.0 3.9  CL 96* 99 103 103 103  CO2 21* 20* 18* 18* 17*  GLUCOSE 102* 113* 96 94 89  BUN 38* 53* 67* 71* 72*  CREATININE 1.73* 2.18* 2.30* 2.05* 2.00*  CALCIUM 7.2* 7.2* 6.9* 6.6* 6.7*  PHOS  --   --   --   --  3.6    Liver Function Tests: Recent Labs  Lab 11/30/18 1745 12/01/18 0537  AST 39 30  ALT 47* 35  ALKPHOS 119 130*  BILITOT 2.0* 1.3*  PROT 5.3* 4.6*  ALBUMIN 2.4* 2.0*   Recent Labs  Lab 11/30/18 1745  LIPASE 18   No results for input(s): AMMONIA in the last 168 hours.  CBC: Recent Labs  Lab 12/01/18 0537 12/02/18 0332 12/03/18 0509 12/04/18 0546 12/05/18 0626  WBC 43.4* 52.5*  52.1* 43.8* 32.9* 23.3*  NEUTROABS  --   45.9*  --   --   --   HGB 10.4* 11.1*  11.1* 10.5* 10.7* 10.0*  HCT 31.9* 34.2*  34.4* 32.4* 32.8* 31.4*  MCV 79.2* 79.2*  78.5* 78.6* 77.5* 79.7*  PLT 636* 671*  686* 605* 562* 490*    Cardiac Enzymes: Recent Labs  Lab 11/30/18 1745 12/01/18 1223  TROPONINI 0.09* <0.03    BNP: Invalid input(s): POCBNP  CBG: No results for input(s): GLUCAP in the last 168 hours.  Microbiology: Results for orders placed or performed during the hospital encounter of 11/30/18  Urine Culture     Status: Abnormal   Collection Time: 11/30/18  5:45 PM  Result Value Ref Range Status   Specimen Description   Final    URINE, RANDOM Performed at [facility], [address] [place], [place] [address]    Special Requests   Final    NONE Performed at [facility], [address] [place], [place] [address]    Culture >=100,000 COLONIES/mL PROTEUS MIRABILIS (A)  Final   Report Status 12/04/2018 FINAL  Final   Organism ID, Bacteria PROTEUS MIRABILIS (A)  Final      Susceptibility   Proteus mirabilis - MIC*    AMPICILLIN <=2 SENSITIVE Sensitive     CEFAZOLIN <=4 SENSITIVE Sensitive  CEFTRIAXONE <=1 SENSITIVE Sensitive     CIPROFLOXACIN 1 SENSITIVE Sensitive     GENTAMICIN <=1 SENSITIVE Sensitive     IMIPENEM 4 SENSITIVE Sensitive     NITROFURANTOIN 128 RESISTANT Resistant     TRIMETH/SULFA <=20 SENSITIVE Sensitive     AMPICILLIN/SULBACTAM <=2 SENSITIVE Sensitive     PIP/TAZO <=4 SENSITIVE Sensitive     * >=100,000 COLONIES/mL PROTEUS MIRABILIS  Blood culture (routine x 2)     Status: None   Collection Time: 11/30/18  5:46 PM  Result Value Ref Range Status   Specimen Description BLOOD  Final   Special Requests NONE  Final   Culture   Final    NO GROWTH 5 DAYS Performed at [facility], [address] [place], [place] [address]    Report Status 12/05/2018 FINAL  Final  Blood culture (routine x 2)     Status: None   Collection Time: 11/30/18  5:46 PM   Result Value Ref Range Status   Specimen Description BLOOD RIGHT ANTECUBITAL  Final   Special Requests   Final    BOTTLES DRAWN AEROBIC AND ANAEROBIC Blood Culture adequate volume   Culture   Final    NO GROWTH 5 DAYS Performed at [facility], [address] [place], [place] [address]    Report Status 12/05/2018 FINAL  Final  Gastrointestinal Panel by PCR , Stool     Status: None   Collection Time: 12/01/18  5:05 AM  Result Value Ref Range Status   Campylobacter species NOT DETECTED NOT DETECTED Final   Plesimonas shigelloides NOT DETECTED NOT DETECTED Final   Salmonella species NOT DETECTED NOT DETECTED Final   Yersinia enterocolitica NOT DETECTED NOT DETECTED Final   Vibrio species NOT DETECTED NOT DETECTED Final   Vibrio cholerae NOT DETECTED NOT DETECTED Final   Enteroaggregative E coli (EAEC) NOT DETECTED NOT DETECTED Final   Enteropathogenic E coli (EPEC) NOT DETECTED NOT DETECTED Final   Enterotoxigenic E coli (ETEC) NOT DETECTED NOT DETECTED Final   Shiga like toxin producing E coli (STEC) NOT DETECTED NOT DETECTED Final   Shigella/Enteroinvasive E coli (EIEC) NOT DETECTED NOT DETECTED Final   Cryptosporidium NOT DETECTED NOT DETECTED Final   Cyclospora cayetanensis NOT DETECTED NOT DETECTED Final   Entamoeba histolytica NOT DETECTED NOT DETECTED Final   Giardia lamblia NOT DETECTED NOT DETECTED Final   Adenovirus F40/41 NOT DETECTED NOT DETECTED Final   Astrovirus NOT DETECTED NOT DETECTED Final   Norovirus GI/GII NOT DETECTED NOT DETECTED Final   Rotavirus A NOT DETECTED NOT DETECTED Final   Sapovirus (I, II, IV, and V) NOT DETECTED NOT DETECTED Final    Comment: Performed at [facility], [address] [place], [place] [address]  C difficile quick scan w PCR reflex     Status: Abnormal   Collection Time: 12/01/18  5:05 AM  Result Value Ref Range Status   C Diff antigen POSITIVE (A) NEGATIVE Final   C Diff toxin POSITIVE (A) NEGATIVE Final   C  Diff interpretation Toxin producing C. difficile detected.  Final    Comment: CRITICAL RESULT CALLED TO, READ BACK BY AND VERIFIED WITH: Liana Crocker AT 1275 ON 12/01/18 JJB Performed at [facility], [address] [place],  [address]     Coagulation Studies: No results for input(s): LABPROT, INR in the last 72 hours.  Urinalysis: No results for input(s): COLORURINE, LABSPEC, PHURINE, GLUCOSEU, HGBUR, BILIRUBINUR, KETONESUR, PROTEINUR, UROBILINOGEN, NITRITE, LEUKOCYTESUR in the last 72  hours.  Invalid input(s): APPERANCEUR    Imaging: No results found.   Medications:   . albumin human 12.5 g (12/05/18 1024)  . heparin 900 Units/hr (12/05/18 0732)   . amiodarone  100 mg Oral Daily  . ciprofloxacin  250 mg Oral BID  . levothyroxine  50 mcg Oral Q0600  . sodium chloride flush  3 mL Intravenous Q12H  . vancomycin  125 mg Oral QID   acetaminophen, azelastine, ondansetron **OR** ondansetron (ZOFRAN) IV, oxyCODONE, sodium chloride flush, traMADol  Assessment/ Plan:  85 y.o. male  with medical problems of atrial fibrillation, aortic stenosis, history of stroke, chronic suprapubic catheter, was admitted on 11/30/2018 with fever and is found to have Proteus UTI, C. difficile colitis, hyponatremia and acute renal failure.   1.  Acute renal failure, baseline creatinine of 1.17/GFR 54 from November 20, 2018 Patient has nonoliguric renal failure likely secondary to ATN.  Multiple factors including ongoing infections, hypotension, intravascular hypovolemia 2.  Hyponatremia, generalized edema Likely dilutional from total body volume overload TSH normal 3.  Urinary tract infection, C. difficile colitis 4.  Metabolic acidosis.   Plan: renal function has not improved significantly.  We will obtain renal ultrasound to make sure there is no underlying obstruction.  Start the patient on gentle hydration with 0.9 normal saline at 40 cc per hour and maintain the patient on  albumin as well.  No urgent indication for dialysis at the moment.  Continue to monitor renal parameters closely and avoid nephrotoxins as possible.   LOS: 5 Anicia Leuthold 2/24/20201:43 PM

## 2018-12-05 NOTE — Progress Notes (Signed)
[facility] NAME: Matthew Hicks    MR#:  [id number]  DATE OF BIRTH:  1925-04-07  SUBJECTIVE:   Patient denies any watery bowel movements.  Denies abdominal pain but decreased appetite   REVIEW OF SYSTEMS:  Review of Systems  Constitutional: Negative for chills and fever.  HENT: Negative for congestion and sore throat.   Eyes: Negative for blurred vision and double vision.  Respiratory: Negative for cough and shortness of breath.   Cardiovascular: Negative for chest pain and palpitations.  Gastrointestinal: Negative for abdominal pain, diarrhea, nausea and vomiting.  Genitourinary: Negative for dysuria and urgency.  Musculoskeletal: Positive for joint pain. Negative for back pain and neck pain.  Neurological: Negative for dizziness and headaches.  Psychiatric/Behavioral: Negative for depression. The patient is not nervous/anxious.     DRUG ALLERGIES:   Allergies  Allergen Reactions  . Amoxicillin-Pot Clavulanate Swelling    Angioedema Has patient had a PCN reaction causing immediate rash, facial/tongue/throat swelling, SOB or lightheadedness with hypotension: Yes Has patient had a PCN reaction causing severe rash involving mucus membranes or skin necrosis: No Has patient had a PCN reaction that required hospitalization: No Has patient had a PCN reaction occurring within the last 10 years: Yes If all of the above answers are "NO", then may proceed with Cephalosporin use.   Marland Kitchen Fesoterodine Fumarate Er Hives and Rash  . Sulfa Antibiotics Rash   VITALS:  Blood pressure 94/68, pulse 83, temperature (!) 97.3 F (36.3 C), temperature source Oral, resp. rate 16, height 5\' 5"  (1.651 m), weight 61 kg, SpO2 98 %. PHYSICAL EXAMINATION:  Physical Exam  GENERAL:  85 y.o.-year-old patient lying in the bed with no acute distress.  Tired-appearing, but nontoxic. EYES: Pupils equal, round, reactive to light and accommodation. No scleral  icterus. Extraocular muscles intact.  HEENT: Head atraumatic, normocephalic. Oropharynx and nasopharynx clear.  NECK:  Supple, no jugular venous distention. No thyroid enlargement, no tenderness.  LUNGS: Normal breath sounds bilaterally, no wheezing, rales,rhonchi or crepitation. No use of accessory muscles of respiration.  CARDIOVASCULAR: Irregularly irregular rate, regular rhythm, S1, S2 normal. No murmurs, rubs, or gallops.  ABDOMEN: Soft, nontender, nondistended. Bowel sounds present.  +Suprapubic catheter appears normal, with some mild surrounding erythema EXTREMITIES: 1+ pitting edema to the mid shins bilaterally.  No cyanosis, or clubbing. + Right upper extremity edema. NEUROLOGIC: Awake alert and oriented x3. Sensation intact. Gait not checked.  PSYCHIATRIC: The patient is alert and oriented x 3. LABORATORY PANEL:  Male CBC Recent Labs  Lab 12/05/18 0626  WBC 23.3*  HGB 10.0*  HCT 31.4*  PLT 490*   ------------------------------------------------------------------------------------------------------------------ Chemistries  Recent Labs  Lab 12/01/18 0537  12/05/18 0626  NA 128*   < > 130*  K 3.9   < > 3.9  CL 96*   < > 103  CO2 21*   < > 17*  GLUCOSE 102*   < > 89  BUN 38*   < > 72*  CREATININE 1.73*   < > 2.00*  CALCIUM 7.2*   < > 6.7*  AST 30  --   --   ALT 35  --   --   ALKPHOS 130*  --   --   BILITOT 1.3*  --   --    < > = values in this interval not displayed.   RADIOLOGY:  No results found. ASSESSMENT AND PLAN:  Acute kidney injury- likely due to dehydration in the  setting of diarrhea.  Creatinine worsening. 1.7-2.3-2.05-2.0 IV albumin -Provided IV fluids,  pharmacy  renal dosed all medications including vancomycin -Avoid nephrotoxic agents -Recheck creatinine in the morning    C. difficile colitis- lactic acidosis has improved, but with worsening leukocytosis -Continue p.o. vancomycin -IV fluids provided  Acute cystitis patient does have a  suprapubic catheter. -No urine culture ordered on admission -urine culture-with Proteus mirabilis, sensitive to ciprofloxacin    Leukocytosis-probably reactive as well as from C. difficile colitis and acute cystitis Started trending down WBC 52.5-43.8-32.8-23.3 Hematology Dr. Mike Gip is following.  Patient to follow-up with her as an outpatient after discharge Peripheral smear with neutrophilia and left shift  Right upper extremity edema- new this morning -Negative right upper extremity Doppler ultrasound -rule out DVT -Continue Xarelto   Elevated troponin- likely due to demand ischemia.  Resolved -Monitor   Hyponatremia- secondary to dehydration from diarrhea.  Sodium improving. -Given IV fluids Sodium-131-129-130 Dietary consult placed for p.o. poor p.o. intake Nephrology consult placed  Recent history of left intertrochanteric fracture -Pain control -Seen by orthopedics staples removed 12/03/2018 ,outpatient follow-up with Dr. Marry Guan in 4 weeks -PT consult  Microcytic anemia- likely due to iron deficiency -Anemia panel ordered -Hematology to see  Chronic atrial fibrillation-rate controlled -Continue amiodarone -Xarelto changed to IV heparin in view of acute renal insufficiency  Hypothyroidism-stable -Continue Synthroid  All the records are reviewed and case discussed with Care Management/Social Worker. Management plans discussed with the patient, family and they are in agreement.  CODE STATUS: DNR  TOTAL TIME TAKING CARE OF THIS PATIENT: 35  minutes.   More than 50% of the time was spent in counseling/coordination of care: YES  POSSIBLE D/C IN 2-3 DAYS, DEPENDING ON CLINICAL CONDITION.   Nicholes Mango M.D on 12/05/2018 at 2:36 PM  Between 7am to 6pm - Pager - [phone]  After 6pm go to [email or web address] - [employer] [facility]  Office  [phone]  CC: Primary care physician; Kirk Ruths, MD  Note: This  dictation was prepared with Dragon dictation along with smaller phrase technology. Any transcriptional errors that result from this process are unintentional.

## 2018-12-06 DIAGNOSIS — E43 Unspecified severe protein-calorie malnutrition: Secondary | ICD-10-CM

## 2018-12-06 LAB — BASIC METABOLIC PANEL
Anion gap: 3 — ABNORMAL LOW (ref 5–15)
BUN: 63 mg/dL — ABNORMAL HIGH (ref 8–23)
CO2: 21 mmol/L — ABNORMAL LOW (ref 22–32)
Calcium: 6.8 mg/dL — ABNORMAL LOW (ref 8.9–10.3)
Chloride: 107 mmol/L (ref 98–111)
Creatinine, Ser: 1.74 mg/dL — ABNORMAL HIGH (ref 0.61–1.24)
GFR calc Af Amer: 39 mL/min — ABNORMAL LOW (ref >60)
GFR, EST NON AFRICAN AMERICAN: 34 mL/min — AB (ref >60)
Glucose, Bld: 113 mg/dL — ABNORMAL HIGH (ref 70–99)
Potassium: 3.7 mmol/L (ref 3.5–5.1)
Sodium: 131 mmol/L — ABNORMAL LOW (ref 135–145)

## 2018-12-06 LAB — HEPARIN LEVEL (UNFRACTIONATED)
HEPARIN UNFRACTIONATED: 0.4 [IU]/mL (ref 0.30–0.70)
Heparin Unfractionated: 0.17 IU/mL — ABNORMAL LOW (ref 0.30–0.70)
Heparin Unfractionated: 0.34 IU/mL (ref 0.30–0.70)

## 2018-12-06 LAB — CBC
HCT: 30.6 % — ABNORMAL LOW (ref 39.0–52.0)
HEMOGLOBIN: 9.9 g/dL — AB (ref 13.0–17.0)
MCH: 25.4 pg — ABNORMAL LOW (ref 26.0–34.0)
MCHC: 32.4 g/dL (ref 30.0–36.0)
MCV: 78.5 fL — ABNORMAL LOW (ref 80.0–100.0)
Platelets: 465 10*3/uL — ABNORMAL HIGH (ref 150–400)
RBC: 3.9 MIL/uL — ABNORMAL LOW (ref 4.22–5.81)
RDW: 17.5 % — ABNORMAL HIGH (ref 11.5–15.5)
WBC: 19.2 10*3/uL — ABNORMAL HIGH (ref 4.0–10.5)
nRBC: 0 % (ref 0.0–0.2)

## 2018-12-06 LAB — APTT
aPTT: 98 seconds — ABNORMAL HIGH (ref 24–36)
aPTT: 45 seconds — ABNORMAL HIGH (ref 24–36)
aPTT: 97 seconds — ABNORMAL HIGH (ref 24–36)

## 2018-12-06 NOTE — Evaluation (Addendum)
Physical Therapy Evaluation Patient Details Name: Matthew Hicks. MRN: [id number] DOB: Feb 03, 1925 Today's Date: 12/06/2018   History of Present Illness  From MD H&P: Pt is a 85 y.o. male with a known history of chronic atrial fibrillation on Xarelto, cardiomyopathy, recent history of left intertrochanteric fracture status post repair February 5 currently at rehab center was brought into the ED as patient had a fever and elevated white blood cell count.  Patient was having diarrhea and came into the ED CT abdomen has revealed proctocolitis and free fluid.  Patient was started on IV antibiotics.  Patient has a suprapubic catheter.  Assessment includes: Acute kidney injury, C-diff, acute cystitis, leukocytosis, RUE edema (-) for DVT, severe protein energy malnutrition, elevated troponin likely demand ischemia, hyponatremia, and recent left intertrochanteric hip fracture.     Clinical Impression  Pt presents with deficits in strength, transfers, mobility, gait, balance, and activity tolerance.  Pt required near total assist with bed mobility tasks.  Once up in sitting the pt was unable to maintain static sitting balance without constant physical assistance to prevent posterior LOB.  Pt did participate with the below therex and is motivated to ambulate again.  Per pt while at the SNF he was able to ambulate 20-30' with a RW.  The patient now presents with profound functional weakness and would not be safe to return home at this time.  Pt will benefit from PT services in a SNF setting upon discharge to safely address above deficits for decreased caregiver assistance and eventual return to PLOF.      Follow Up Recommendations SNF    Equipment Recommendations  None recommended by PT    Recommendations for Other Services       Precautions / Restrictions Precautions Precautions: Fall Restrictions Weight Bearing Restrictions: No LLE Weight Bearing: Weight bearing as tolerated(No WB orders noted  during chart review with pt being WBAT to the LLE during previous admission for LLE ORIF.  )      Mobility  Bed Mobility Overal bed mobility: Needs Assistance Bed Mobility: Supine to Sit;Sit to Supine Rolling: Max assist   Supine to sit: Max assist Sit to supine: Max assist   General bed mobility comments: Patient required max A for both LEs and torso in/out of bed; pt unable to maintain static sitting position without constant asssistance at the EOB  Transfers                 General transfer comment: Unable/unsafe to attempt; pt unable to maintain static sitting at EOB requiring constant assistance to prevent posterior LOB  Ambulation/Gait             General Gait Details: unable  Stairs            Wheelchair Mobility    Modified Rankin (Stroke Patients Only)       Balance Overall balance assessment: History of Falls;Needs assistance Sitting-balance support: Feet supported Sitting balance-Leahy Scale: Poor   Postural control: Posterior lean     Standing balance comment: unable                             Pertinent Vitals/Pain Pain Assessment: 0-10    Home Living Family/patient expects to be discharged to:: Skilled nursing facility Living Arrangements: Spouse/significant other Available Help at Discharge: Family;Available 24 hours/day Type of Home: House Home Access: Stairs to enter Entrance Stairs-Rails: Right;Left(too wide for both) Entrance Stairs-Number of Steps: 2 front  entrance, 5 back entrance (typically uses back) Handrails on both. Home Layout: One level Home Equipment: Walker - 2 wheels;Cane - single point      Prior Function Level of Independence: Independent with assistive device(s)         Comments: Mod Ind amb with a SPC prior to recent LLE Fx s/p ORIF     Hand Dominance   Dominant Hand: Right    Extremity/Trunk Assessment   Upper Extremity Assessment Upper Extremity Assessment: Generalized  weakness    Lower Extremity Assessment Lower Extremity Assessment: Generalized weakness       Communication   Communication: HOH  Cognition Arousal/Alertness: Awake/alert Behavior During Therapy: Flat affect Overall Cognitive Status: Within Functional Limits for tasks assessed                                 General Comments: Pt very HOH making communication difficult      General Comments      Exercises Total Joint Exercises Ankle Circles/Pumps: AROM;Both;10 reps;5 reps Quad Sets: Strengthening;Both;5 reps;10 reps Gluteal Sets: Strengthening;Both;10 reps;5 reps Short Arc Quad: AROM;Both;10 reps Heel Slides: AAROM;Both;5 reps Hip ABduction/ADduction: AAROM;Both;5 reps Straight Leg Raises: AAROM;Both;5 reps Long Arc Quad: AROM;Both;10 reps Knee Flexion: AROM;Both;10 reps   Assessment/Plan    PT Assessment Patient needs continued PT services  PT Problem List Decreased strength;Decreased activity tolerance;Decreased balance;Decreased mobility       PT Treatment Interventions DME instruction;Gait training;Stair training;Functional mobility training;Therapeutic activities;Therapeutic exercise;Balance training;Patient/family education    PT Goals (Current goals can be found in the Care Plan section)  Acute Rehab PT Goals Patient Stated Goal: To get stronger PT Goal Formulation: With patient Time For Goal Achievement: 12/19/18 Potential to Achieve Goals: Good    Frequency 2x/wk   Barriers to discharge Inaccessible home environment;Decreased caregiver support      Co-evaluation               AM-PAC PT "6 Clicks" Mobility  Outcome Measure Help needed turning from your back to your side while in a flat bed without using bedrails?: A Lot Help needed moving from lying on your back to sitting on the side of a flat bed without using bedrails?: A Lot Help needed moving to and from a bed to a chair (including a wheelchair)?: Total Help needed standing  up from a chair using your arms (e.g., wheelchair or bedside chair)?: Total Help needed to walk in hospital room?: Total Help needed climbing 3-5 steps with a railing? : Total 6 Click Score: 8    End of Session Equipment Utilized During Treatment: Gait belt;Oxygen Activity Tolerance: Patient limited by fatigue Patient left: with call bell/phone within reach;in bed;with bed alarm set;with nursing/sitter in room Nurse Communication: Mobility status PT Visit Diagnosis: History of falling (Z91.81);Muscle weakness (generalized) (M62.81);Difficulty in walking, not elsewhere classified (R26.2)    Time: 1455-1520 PT Time Calculation (min) (ACUTE ONLY): 25 min   Charges:   PT Evaluation $PT Eval Low Complexity: 1 Low PT Treatments $Therapeutic Exercise: 8-22 mins       D. Scott Ryhanna [place] PT, DPT 12/07/18, 10:51 AM

## 2018-12-06 NOTE — Progress Notes (Signed)
[place] for Heparin  Indication: atrial fibrillation  Allergies  Allergen Reactions  . Amoxicillin-Pot Clavulanate Swelling    Angioedema Has patient had a PCN reaction causing immediate rash, facial/tongue/throat swelling, SOB or lightheadedness with hypotension: Yes Has patient had a PCN reaction causing severe rash involving mucus membranes or skin necrosis: No Has patient had a PCN reaction that required hospitalization: No Has patient had a PCN reaction occurring within the last 10 years: Yes If all of the above answers are "NO", then may proceed with Cephalosporin use.   Marland Kitchen Fesoterodine Fumarate Er Hives and Rash  . Sulfa Antibiotics Rash    Patient Measurements: Height: 5\' 5"  (165.1 cm) Weight: 134 lb 7.7 oz (61 kg) IBW/kg (Calculated) : 61.5 Heparin Dosing Weight:   Vital Signs: Temp: 97.8 F (36.6 C) (02/25 0417) Temp Source: Oral (02/25 0417) BP: 99/60 (02/25 0545) Pulse Rate: 68 (02/25 0417)  Labs: Recent Labs    12/04/18 0546  12/05/18 0626 12/05/18 1258 12/05/18 1901 12/06/18 0441  HGB 10.7*  --  10.0*  --   --  9.9*  HCT 32.8*  --  31.4*  --   --  30.6*  PLT 562*  --  490*  --   --  465*  APTT  --    < > 114* 73* 86* 98*  HEPARINUNFRC 2.54*  --  0.97*  --   --  0.40  CREATININE 2.05*  --  2.00*  --   --  1.74*   < > = values in this interval not displayed.    Estimated Creatinine Clearance: 23.9 mL/min (A) (by C-G formula based on SCr of 1.74 mg/dL (H)).   Medical History: Past Medical History:  Diagnosis Date  . A-fib ([place]) 04/08/2014  . Aortic stenosis   . Cardiomyopathy ([place])   . Cataract   . CVA (cerebral infarction) 2003  . Dysrhythmia   . GERD (gastroesophageal reflux disease)   . Hearing loss   . Hyperlipidemia   . Hypertension 05/04/2014  . Lung mass   . Macular degeneration   . Nephrolithiasis   . Pernicious anemia   . Pneumonia 05/2015  . Prostate cancer [facility]) 2007   performed by Dr. Eliberto Ivory   . Skin cancer   . Spinal stenosis   . Stroke [facility])     Medications:  Medications Prior to Admission  Medication Sig Dispense Refill Last Dose  . acetaminophen (TYLENOL) 650 MG CR tablet Take 650 mg by mouth every 8 (eight) hours as needed for pain.   11/30/2018 at 1303  . amiodarone (PACERONE) 100 MG tablet Take 100 mg by mouth daily.   11/30/2018 at 0923  . atorvastatin (LIPITOR) 20 MG tablet Take 20 mg by mouth daily.   11/29/2018 at 2003  . Cyanocobalamin (B-12) 5000 MCG CAPS Take 5,000 mcg by mouth daily.    11/30/2018 at 0923  . furosemide (LASIX) 20 MG tablet Take 20 mg by mouth daily.    11/30/2018 at 0923  . levothyroxine (SYNTHROID, LEVOTHROID) 50 MCG tablet Take 50 mcg by mouth daily.   11/30/2018 at 0923  . Multiple Vitamin (MULTIVITAMIN WITH MINERALS) TABS tablet Take 1 tablet by mouth daily.   11/30/2018 at 0923  . Multiple Vitamins-Minerals (ICAPS) CAPS Take 1 tablet by mouth daily.   11/30/2018 at 0923  . rivaroxaban (XARELTO) 10 MG TABS tablet TAKE 1 TABLET(10 MG TOTAL) BY MOUTH DAILY WITH DINNER.   11/29/2018 at 2003  . vitamin C (ASCORBIC  ACID) 250 MG tablet Take 500 mg by mouth daily.   11/30/2018 at 0923  . zinc sulfate 220 (50 Zn) MG capsule Take 220 mg by mouth daily.   11/30/2018 at 0923  . azelastine (ASTELIN) 0.1 % nasal spray Place 2 sprays into the nose daily as needed for rhinitis or allergies.    prn at prn  . docusate sodium (COLACE) 100 MG capsule Take 100 mg by mouth 2 (two) times daily.   prn at prn  . oxyCODONE (OXY IR/ROXICODONE) 5 MG immediate release tablet Take 1 tablet (5 mg total) by mouth every 4 (four) hours as needed for moderate pain (pain score 4-6). 30 tablet 0 11/28/2018 at 1920  . Polyethylene Glycol 3350 (PEG 3350) POWD Take 17 g by mouth daily.   prn at prn    Assessment: Pharmacy consulted for heparin drip dosing and monitoring in 85 yo male with PMH of A. Fib. Patient is currently ordered Rivaroxaban 15mg  daily. Due to increasing Scr and declining  renal function patient will be transitioned to heparin infusion.  Last rivaroxaban dose was 2/21 @ 1723.  2/22 heparin infusion Started @ 900 units/hr 2/23 @ 0200 aPTT 70 (900 units/hour) 2/23 @ 1021 aPTT 52 (900 units/hour) 2/23 @ 1925 aPTT 103 (1050 units/hour) 2/24 @ 0626 aPTT 114 (1000 units/hr) heparin level 0.97 2/24 @ 1258 aPTT 73 (900 units/hr) - Therapeutic x 1 2/25 @  0500 aPTT 98 (900 units/hr)- heparin level 0.4 2/25 @ 1300  Heparin level- 0.17. Called RN to make sure heparin drip was running. Ordered and called lab with add on APTT.  2/25 @ 1304 aPTT 45 and Heparin level 0.17. Increase drip back to 1000units/hr and repeat levels in 8 hours.  Goal of Therapy:  Heparin level 0.3-0.7 units/ml aPTT 66-102 seconds Monitor platelets by anticoagulation protocol: Yes   Plan:  02/25 @ 0500 aPTT 98 seconds; HL 0.40 both therapeutic and correlating. Will dose off of HL now since both are correlating. Will continue current rate and will recheck HL @ 1300, CBC trending down slightly will continue to monitor.  Dorothyann Mourer, PharmD, BCPS Clinical Pharmacist 12/06/2018 8:13 AM

## 2018-12-06 NOTE — Care Management Important Message (Signed)
Important Message  Patient Details  Name: Matthew Hicks. MRN: [id number] Date of Birth: [birth year]   Medicare Important Message Given:  Yes    Katrina Stack, RN 12/06/2018, 6:08 PM

## 2018-12-06 NOTE — Progress Notes (Signed)
[place] for Heparin  Indication: atrial fibrillation  Allergies  Allergen Reactions  . Amoxicillin-Pot Clavulanate Swelling    Angioedema Has patient had a PCN reaction causing immediate rash, facial/tongue/throat swelling, SOB or lightheadedness with hypotension: Yes Has patient had a PCN reaction causing severe rash involving mucus membranes or skin necrosis: No Has patient had a PCN reaction that required hospitalization: No Has patient had a PCN reaction occurring within the last 10 years: Yes If all of the above answers are "NO", then may proceed with Cephalosporin use.   Marland Kitchen Fesoterodine Fumarate Er Hives and Rash  . Sulfa Antibiotics Rash    Patient Measurements: Height: 5\' 5"  (165.1 cm) Weight: 134 lb 7.7 oz (61 kg) IBW/kg (Calculated) : 61.5 Heparin Dosing Weight:   Vital Signs: Temp: 97.8 F (36.6 C) (02/25 0417) Temp Source: Oral (02/25 0417) BP: 85/52 (02/25 0417) Pulse Rate: 68 (02/25 0417)  Labs: Recent Labs    12/04/18 0546  12/05/18 0626 12/05/18 1258 12/05/18 1901 12/06/18 0441  HGB 10.7*  --  10.0*  --   --  9.9*  HCT 32.8*  --  31.4*  --   --  30.6*  PLT 562*  --  490*  --   --  465*  APTT  --    < > 114* 73* 86* 98*  HEPARINUNFRC 2.54*  --  0.97*  --   --  0.40  CREATININE 2.05*  --  2.00*  --   --  1.74*   < > = values in this interval not displayed.    Estimated Creatinine Clearance: 23.9 mL/min (A) (by C-G formula based on SCr of 1.74 mg/dL (H)).   Medical History: Past Medical History:  Diagnosis Date  . A-fib ([place]) 04/08/2014  . Aortic stenosis   . Cardiomyopathy ([place])   . Cataract   . CVA (cerebral infarction) 2003  . Dysrhythmia   . GERD (gastroesophageal reflux disease)   . Hearing loss   . Hyperlipidemia   . Hypertension 05/04/2014  . Lung mass   . Macular degeneration   . Nephrolithiasis   . Pernicious anemia   . Pneumonia 05/2015  . Prostate cancer [facility]) 2007   performed by Dr. Eliberto Ivory   . Skin cancer   . Spinal stenosis   . Stroke [facility])     Medications:  Medications Prior to Admission  Medication Sig Dispense Refill Last Dose  . acetaminophen (TYLENOL) 650 MG CR tablet Take 650 mg by mouth every 8 (eight) hours as needed for pain.   11/30/2018 at 1303  . amiodarone (PACERONE) 100 MG tablet Take 100 mg by mouth daily.   11/30/2018 at 0923  . atorvastatin (LIPITOR) 20 MG tablet Take 20 mg by mouth daily.   11/29/2018 at 2003  . Cyanocobalamin (B-12) 5000 MCG CAPS Take 5,000 mcg by mouth daily.    11/30/2018 at 0923  . furosemide (LASIX) 20 MG tablet Take 20 mg by mouth daily.    11/30/2018 at 0923  . levothyroxine (SYNTHROID, LEVOTHROID) 50 MCG tablet Take 50 mcg by mouth daily.   11/30/2018 at 0923  . Multiple Vitamin (MULTIVITAMIN WITH MINERALS) TABS tablet Take 1 tablet by mouth daily.   11/30/2018 at 0923  . Multiple Vitamins-Minerals (ICAPS) CAPS Take 1 tablet by mouth daily.   11/30/2018 at 0923  . rivaroxaban (XARELTO) 10 MG TABS tablet TAKE 1 TABLET(10 MG TOTAL) BY MOUTH DAILY WITH DINNER.   11/29/2018 at 2003  . vitamin C (ASCORBIC  ACID) 250 MG tablet Take 500 mg by mouth daily.   11/30/2018 at 0923  . zinc sulfate 220 (50 Zn) MG capsule Take 220 mg by mouth daily.   11/30/2018 at 0923  . azelastine (ASTELIN) 0.1 % nasal spray Place 2 sprays into the nose daily as needed for rhinitis or allergies.    prn at prn  . docusate sodium (COLACE) 100 MG capsule Take 100 mg by mouth 2 (two) times daily.   prn at prn  . oxyCODONE (OXY IR/ROXICODONE) 5 MG immediate release tablet Take 1 tablet (5 mg total) by mouth every 4 (four) hours as needed for moderate pain (pain score 4-6). 30 tablet 0 11/28/2018 at 1920  . Polyethylene Glycol 3350 (PEG 3350) POWD Take 17 g by mouth daily.   prn at prn    Assessment: Pharmacy consulted for heparin drip dosing and monitoring in 85 yo male with PMH of A. Fib. Patient is currently ordered Rivaroxaban 15mg  daily. Due to increasing Scr and declining  renal function patient will be transitioned to heparin infusion.  Last rivaroxaban dose was 2/21 @ 1723.  2/22 heparin infusion Started @ 900 units/hr 2/23 @ 0200 aPTT 70 (900 units/hour) 2/23 @ 1021 aPTT 52 (900 units/hour) 2/23 @ 1925 aPTT 103 (1050 units/hour) 2/24 @ 0626 aPTT 114 (1000 units/hr) heparin level 0.97 2/24 @ 1258 aPTT 73 (900 units/hr) - Therapeutic x 1  Goal of Therapy:  Heparin level 0.3-0.7 units/ml aPTT 66-102 seconds Monitor platelets by anticoagulation protocol: Yes   Plan:  02/25 @ 0500 aPTT 98 seconds; HL 0.40 both therapeutic and correlating. Will dose off of HL now since both are correlating. Will continue current rate and will recheck HL @ 1300, CBC trending down slightly will continue to monitor.  Tobie Lords, PharmD, BCPS Clinical Pharmacist 12/06/2018 5:36 AM

## 2018-12-06 NOTE — Care Management (Signed)
[place]- Renal status slowly improving. Still requiring iv hydration. Remains on IV heparin rather than his chronic Xarelto due to renal issues. WBC down to 19.2 from the 52.5 on 2/21.

## 2018-12-06 NOTE — Progress Notes (Signed)
[facility] [place] NAME: Artice Bergerson    MR#:  [id number]  DATE OF BIRTH:  Mar 01, 1925  SUBJECTIVE:   Patient denies any watery bowel movements.  Denies abdominal pain but decreased appetite , extremely weak  REVIEW OF SYSTEMS:  Review of Systems  Constitutional: Negative for chills and fever.  HENT: Negative for congestion and sore throat.   Eyes: Negative for blurred vision and double vision.  Respiratory: Negative for cough and shortness of breath.   Cardiovascular: Negative for chest pain and palpitations.  Gastrointestinal: Negative for abdominal pain, diarrhea, nausea and vomiting.  Genitourinary: Negative for dysuria and urgency.  Musculoskeletal: Positive for joint pain. Negative for back pain and neck pain.  Neurological: Negative for dizziness and headaches.  Psychiatric/Behavioral: Negative for depression. The patient is not nervous/anxious.     DRUG ALLERGIES:   Allergies  Allergen Reactions  . Amoxicillin-Pot Clavulanate Swelling    Angioedema Has patient had a PCN reaction causing immediate rash, facial/tongue/throat swelling, SOB or lightheadedness with hypotension: Yes Has patient had a PCN reaction causing severe rash involving mucus membranes or skin necrosis: No Has patient had a PCN reaction that required hospitalization: No Has patient had a PCN reaction occurring within the last 10 years: Yes If all of the above answers are "NO", then may proceed with Cephalosporin use.   Marland Kitchen Fesoterodine Fumarate Er Hives and Rash  . Sulfa Antibiotics Rash   VITALS:  Blood pressure 99/60, pulse 68, temperature 97.8 F (36.6 C), temperature source Oral, resp. rate 18, height 5\' 5"  (1.651 m), weight 61 kg, SpO2 100 %. PHYSICAL EXAMINATION:  Physical Exam  GENERAL:  85 y.o.-year-old patient lying in the bed with no acute distress.  Tired-appearing, but nontoxic. EYES: Pupils equal, round, reactive to light and accommodation.  No scleral icterus. Extraocular muscles intact.  HEENT: Head atraumatic, normocephalic. Oropharynx and nasopharynx clear.  NECK:  Supple, no jugular venous distention. No thyroid enlargement, no tenderness.  LUNGS: Normal breath sounds bilaterally, no wheezing, rales,rhonchi or crepitation. No use of accessory muscles of respiration.  CARDIOVASCULAR: Irregularly irregular rate, regular rhythm, S1, S2 normal. No murmurs, rubs, or gallops.  ABDOMEN: Soft, nontender, nondistended. Bowel sounds present.  +Suprapubic catheter appears normal, with some mild surrounding erythema EXTREMITIES: 1+ pitting edema to the mid shins bilaterally.  No cyanosis, or clubbing. + Right upper extremity edema. NEUROLOGIC: Awake alert and oriented x3. Sensation intact. Gait not checked.  PSYCHIATRIC: The patient is alert and oriented x 3. LABORATORY PANEL:  Male CBC Recent Labs  Lab 12/06/18 0441  WBC 19.2*  HGB 9.9*  HCT 30.6*  PLT 465*   ------------------------------------------------------------------------------------------------------------------ Chemistries  Recent Labs  Lab 12/01/18 0537  12/06/18 0441  NA 128*   < > 131*  K 3.9   < > 3.7  CL 96*   < > 107  CO2 21*   < > 21*  GLUCOSE 102*   < > 113*  BUN 38*   < > 63*  CREATININE 1.73*   < > 1.74*  CALCIUM 7.2*   < > 6.8*  AST 30  --   --   ALT 35  --   --   ALKPHOS 130*  --   --   BILITOT 1.3*  --   --    < > = values in this interval not displayed.   RADIOLOGY:  US Renal  Result Date: 12/05/2018 CLINICAL DATA:  Acute kidney failure EXAM: RENAL /  URINARY TRACT ULTRASOUND COMPLETE COMPARISON:  None. FINDINGS: Right Kidney: Renal measurements: 9.1 x 4.3 x 5 cm = volume: 102 mL . Echogenicity within normal limits. 11 mm cysts within the upper pole. No suspicious mass or hydronephrosis visualized. Left Kidney: Renal measurements: 9.9 x 4.7 x 4.3 cm = volume: 106 mL. Echogenicity within normal limits. No mass or hydronephrosis visualized.  Bladder: Decompressed by a suprapubic catheter. Incidental note made of ascites within the abdomen and pelvis. IMPRESSION: 1. No acute or significant renal findings.  No hydronephrosis. 2. Bladder decompressed by a suprapubic catheter. 3. Ascites. Electronically Signed   By: Franki Cabot M.D.   On: 12/05/2018 16:16   ASSESSMENT AND PLAN:  Acute kidney injury- likely due to dehydration in the setting of diarrhea.  Creatinine worsening. 1.7-2.3-2.05-2.0-1.7 IV albumin -Provided IV fluids,  pharmacy  renal dosed all medications including vancomycin -Avoid nephrotoxic agents -Recheck creatinine in the morning  C. difficile colitis- lactic acidosis has improved, but with worsening leukocytosis -Continue p.o. vancomycin -IV fluids provided  Acute cystitis patient does have a suprapubic catheter. -No urine culture ordered on admission -urine culture-with Proteus mirabilis, sensitive to ciprofloxacin  Leukocytosis-probably reactive as well as from C. difficile colitis and acute cystitis Started trending down WBC 52.5-43.8-32.8-23.3 -19.2  Hematology Dr. Mike Gip is following.  Patient to follow-up with her as an outpatient after discharge Peripheral smear with neutrophilia and left shift  Right upper extremity edema- new this morning -Negative right upper extremity Doppler ultrasound -rule out DVT -Continue Xarelto  Severe protein energy malnutrition Recommending to liberalize diet to regular diet Ensure Enlive p.o. 3 times daily  Elevated troponin- likely due to demand ischemia.  Resolved -Monitor   Hyponatremia- secondary to dehydration from diarrhea.  Sodium improving. -Given IV fluids Sodium-131-129-130-131 Nephrology following and recommending IV albumin for another day  Recent history of left intertrochanteric fracture -Pain control -Seen by orthopedics staples removed 12/03/2018 ,outpatient follow-up with Dr. Marry Guan in 4 weeks -PT consult- rehab  Microcytic anemia-  likely due to iron deficiency -Anemia panel ordered -Hematology to see  Chronic atrial fibrillation-rate controlled -Continue amiodarone -Xarelto changed to IV heparin in view of acute renal insufficiency  Hypothyroidism-stable -Continue Synthroid  All the records are reviewed and case discussed with Care Management/Social Worker. Management plans discussed with the patient, family and they are in agreement.  CODE STATUS: DNR  TOTAL TIME TAKING CARE OF THIS PATIENT: 35  minutes.   More than 50% of the time was spent in counseling/coordination of care: YES  POSSIBLE D/C IN 2-3 DAYS, DEPENDING ON CLINICAL CONDITION.   Nicholes Mango M.D on 12/06/2018 at 1:33 PM  Between 7am to 6pm - Pager - [phone]  After 6pm go to [email or web address] - Technical brewer [place] [facility]  Office  [phone]  CC: Primary care physician; Kirk Ruths, MD  Note: This dictation was prepared with Dragon dictation along with smaller phrase technology. Any transcriptional errors that result from this process are unintentional.

## 2018-12-06 NOTE — Progress Notes (Signed)
[facility]  ROUNDING NOTE   Subjective:  Patient seen at bedside. Renal function has improved. Son also at bedside.   Objective:  Vital signs in last 24 hours:  Temp:  [97.5 F (36.4 C)-98.5 F (36.9 C)] 97.8 F (36.6 C) (02/25 0417) Pulse Rate:  [68-72] 68 (02/25 0417) Resp:  [16-18] 18 (02/25 0417) BP: (85-100)/(52-60) 99/60 (02/25 0545) SpO2:  [97 %-100 %] 100 % (02/25 0417)  Weight change:  Filed Weights   11/30/18 1732  Weight: 61 kg    Intake/Output: I/O last 3 completed shifts: In: 390.8 [I.V.:313.8; IV Piggyback:77] Out: 3875 [Urine:1175; Stool:1]   Intake/Output this shift:  No intake/output data recorded.  Physical Exam: General: No acute distress  Head: Normocephalic, atraumatic. Moist oral mucosal membranes  Eyes: Anicteric  Neck: Supple, trachea midline  Lungs:  Clear to auscultation, normal effort  Heart: S1S2 no rubs  Abdomen:  Soft, nontender, bowel sounds present  Extremities: 2+ peripheral edema.  Neurologic: Awake, alert, hard of hearing  Skin: LLE with erythema       Basic Metabolic Panel: Recent Labs  Lab 12/02/18 0332 12/03/18 0509 12/04/18 0546 12/05/18 0626 12/06/18 0441  NA 130* 131* 129* 130* 131*  K 4.3 4.2 4.0 3.9 3.7  CL 99 103 103 103 107  CO2 20* 18* 18* 17* 21*  GLUCOSE 113* 96 94 89 113*  BUN 53* 67* 71* 72* 63*  CREATININE 2.18* 2.30* 2.05* 2.00* 1.74*  CALCIUM 7.2* 6.9* 6.6* 6.7* 6.8*  PHOS  --   --   --  3.6  --     Liver Function Tests: Recent Labs  Lab 11/30/18 1745 12/01/18 0537  AST 39 30  ALT 47* 35  ALKPHOS 119 130*  BILITOT 2.0* 1.3*  PROT 5.3* 4.6*  ALBUMIN 2.4* 2.0*   Recent Labs  Lab 11/30/18 1745  LIPASE 18   No results for input(s): AMMONIA in the last 168 hours.  CBC: Recent Labs  Lab 12/02/18 0332 12/03/18 0509 12/04/18 0546 12/05/18 0626 12/06/18 0441  WBC 52.5*  52.1* 43.8* 32.9* 23.3* 19.2*  NEUTROABS 45.9*  --   --   --   --   HGB 11.1*  11.1* 10.5*  10.7* 10.0* 9.9*  HCT 34.2*  34.4* 32.4* 32.8* 31.4* 30.6*  MCV 79.2*  78.5* 78.6* 77.5* 79.7* 78.5*  PLT 671*  686* 605* 562* 490* 465*    Cardiac Enzymes: Recent Labs  Lab 11/30/18 1745 12/01/18 1223  TROPONINI 0.09* <0.03    BNP: Invalid input(s): POCBNP  CBG: No results for input(s): GLUCAP in the last 168 hours.  Microbiology: Results for orders placed or performed during the hospital encounter of 11/30/18  Urine Culture     Status: Abnormal   Collection Time: 11/30/18  5:45 PM  Result Value Ref Range Status   Specimen Description   Final    URINE, RANDOM Performed at [facility], [address] [place], [place] [address]    Special Requests   Final    NONE Performed at [facility], [address] [place], [place] [address]    Culture >=100,000 COLONIES/mL PROTEUS MIRABILIS (A)  Final   Report Status 12/04/2018 FINAL  Final   Organism ID, Bacteria PROTEUS MIRABILIS (A)  Final      Susceptibility   Proteus mirabilis - MIC*    AMPICILLIN <=2 SENSITIVE Sensitive     CEFAZOLIN <=4 SENSITIVE Sensitive     CEFTRIAXONE <=1 SENSITIVE Sensitive     CIPROFLOXACIN  1 SENSITIVE Sensitive     GENTAMICIN <=1 SENSITIVE Sensitive     IMIPENEM 4 SENSITIVE Sensitive     NITROFURANTOIN 128 RESISTANT Resistant     TRIMETH/SULFA <=20 SENSITIVE Sensitive     AMPICILLIN/SULBACTAM <=2 SENSITIVE Sensitive     PIP/TAZO <=4 SENSITIVE Sensitive     * >=100,000 COLONIES/mL PROTEUS MIRABILIS  Blood culture (routine x 2)     Status: None   Collection Time: 11/30/18  5:46 PM  Result Value Ref Range Status   Specimen Description BLOOD  Final   Special Requests NONE  Final   Culture   Final    NO GROWTH 5 DAYS Performed at [facility], [address] [place], [place] [address]    Report Status 12/05/2018 FINAL  Final  Blood culture (routine x 2)     Status: None   Collection Time: 11/30/18  5:46 PM  Result Value Ref Range Status   Specimen  Description BLOOD RIGHT ANTECUBITAL  Final   Special Requests   Final    BOTTLES DRAWN AEROBIC AND ANAEROBIC Blood Culture adequate volume   Culture   Final    NO GROWTH 5 DAYS Performed at [facility], [address] [place], [place] [address]    Report Status 12/05/2018 FINAL  Final  Gastrointestinal Panel by PCR , Stool     Status: None   Collection Time: 12/01/18  5:05 AM  Result Value Ref Range Status   Campylobacter species NOT DETECTED NOT DETECTED Final   Plesimonas shigelloides NOT DETECTED NOT DETECTED Final   Salmonella species NOT DETECTED NOT DETECTED Final   Yersinia enterocolitica NOT DETECTED NOT DETECTED Final   Vibrio species NOT DETECTED NOT DETECTED Final   Vibrio cholerae NOT DETECTED NOT DETECTED Final   Enteroaggregative E coli (EAEC) NOT DETECTED NOT DETECTED Final   Enteropathogenic E coli (EPEC) NOT DETECTED NOT DETECTED Final   Enterotoxigenic E coli (ETEC) NOT DETECTED NOT DETECTED Final   Shiga like toxin producing E coli (STEC) NOT DETECTED NOT DETECTED Final   Shigella/Enteroinvasive E coli (EIEC) NOT DETECTED NOT DETECTED Final   Cryptosporidium NOT DETECTED NOT DETECTED Final   Cyclospora cayetanensis NOT DETECTED NOT DETECTED Final   Entamoeba histolytica NOT DETECTED NOT DETECTED Final   Giardia lamblia NOT DETECTED NOT DETECTED Final   Adenovirus F40/41 NOT DETECTED NOT DETECTED Final   Astrovirus NOT DETECTED NOT DETECTED Final   Norovirus GI/GII NOT DETECTED NOT DETECTED Final   Rotavirus A NOT DETECTED NOT DETECTED Final   Sapovirus (I, II, IV, and V) NOT DETECTED NOT DETECTED Final    Comment: Performed at [facility], [address] [place], [place] [address]  C difficile quick scan w PCR reflex     Status: Abnormal   Collection Time: 12/01/18  5:05 AM  Result Value Ref Range Status   C Diff antigen POSITIVE (A) NEGATIVE Final   C Diff toxin POSITIVE (A) NEGATIVE Final   C Diff interpretation Toxin producing C.  difficile detected.  Final    Comment: CRITICAL RESULT CALLED TO, READ BACK BY AND VERIFIED WITH: Liana Crocker AT 4627 ON 12/01/18 JJB Performed at [facility], [address] [place],  [address]     Coagulation Studies: No results for input(s): LABPROT, INR in the last 72 hours.  Urinalysis: No results for input(s): COLORURINE, LABSPEC, PHURINE, GLUCOSEU, HGBUR, BILIRUBINUR, KETONESUR, PROTEINUR, UROBILINOGEN, NITRITE, LEUKOCYTESUR in the last 72 hours.  Invalid input(s): APPERANCEUR    Imaging:  US Renal  Result Date: 12/05/2018 CLINICAL DATA:  Acute kidney failure EXAM: RENAL / URINARY TRACT ULTRASOUND COMPLETE COMPARISON:  None. FINDINGS: Right Kidney: Renal measurements: 9.1 x 4.3 x 5 cm = volume: 102 mL . Echogenicity within normal limits. 11 mm cysts within the upper pole. No suspicious mass or hydronephrosis visualized. Left Kidney: Renal measurements: 9.9 x 4.7 x 4.3 cm = volume: 106 mL. Echogenicity within normal limits. No mass or hydronephrosis visualized. Bladder: Decompressed by a suprapubic catheter. Incidental note made of ascites within the abdomen and pelvis. IMPRESSION: 1. No acute or significant renal findings.  No hydronephrosis. 2. Bladder decompressed by a suprapubic catheter. 3. Ascites. Electronically Signed   By: Franki Cabot M.D.   On: 12/05/2018 16:16     Medications:   . sodium chloride 40 mL/hr at 12/05/18 1700  . albumin human 12.5 g (12/06/18 0842)  . heparin 900 Units/hr (12/05/18 2226)   . amiodarone  100 mg Oral Daily  . feeding supplement (ENSURE ENLIVE)  237 mL Oral TID BM  . levothyroxine  50 mcg Oral Q0600  . sodium chloride flush  3 mL Intravenous Q12H  . vancomycin  125 mg Oral QID   acetaminophen, azelastine, ondansetron **OR** ondansetron (ZOFRAN) IV, oxyCODONE, sodium chloride flush, traMADol  Assessment/ Plan:  85 y.o. male  with medical problems of atrial fibrillation, aortic stenosis, history of stroke, chronic  suprapubic catheter, was admitted on 11/30/2018 with fever and is found to have Proteus UTI, C. difficile colitis, hyponatremia and acute renal failure.   1.  Acute renal failure/CKD stage III, baseline creatinine of 1.17/GFR 54 from November 20, 2018 Patient has nonoliguric renal failure likely secondary to ATN.  Multiple factors including ongoing infections, hypotension, intravascular hypovolemia 2.  Hyponatremia, generalized edema Likely dilutional from total body volume overload TSH normal 3.  Urinary tract infection, C. difficile colitis 4.  Metabolic acidosis.   Plan: Renal function improved today.  BUN down to 62 with a creatinine 1.74.  Renal ultrasound reviewed and negative for hydronephrosis.  Continue gentle hydration for one additional day.  Hyponatremia also slightly improved his serum sodium up to 131.  Further plan as patient progresses.   LOS: 6 Matthew Hicks 2/25/202012:12 PM

## 2018-12-07 LAB — MULTIPLE MYELOMA PANEL, SERUM
Albumin SerPl Elph-Mcnc: 1.7 g/dL — ABNORMAL LOW (ref 2.9–4.4)
Albumin/Glob SerPl: 0.8 (ref 0.7–1.7)
Alpha 1: 0.4 g/dL (ref 0.0–0.4)
Alpha2 Glob SerPl Elph-Mcnc: 0.8 g/dL (ref 0.4–1.0)
B-Globulin SerPl Elph-Mcnc: 0.5 g/dL — ABNORMAL LOW (ref 0.7–1.3)
Gamma Glob SerPl Elph-Mcnc: 0.5 g/dL (ref 0.4–1.8)
Globulin, Total: 2.2 g/dL (ref 2.2–3.9)
IgA: 206 mg/dL (ref 61–437)
IgG (Immunoglobin G), Serum: 590 mg/dL — ABNORMAL LOW (ref 700–1600)
IgM (Immunoglobulin M), Srm: 25 mg/dL (ref 15–143)
Total Protein ELP: 3.9 g/dL — ABNORMAL LOW (ref 6.0–8.5)

## 2018-12-07 LAB — CBC
HCT: 34.3 % — ABNORMAL LOW (ref 39.0–52.0)
Hemoglobin: 10.9 g/dL — ABNORMAL LOW (ref 13.0–17.0)
MCH: 25.5 pg — ABNORMAL LOW (ref 26.0–34.0)
MCHC: 31.8 g/dL (ref 30.0–36.0)
MCV: 80.3 fL (ref 80.0–100.0)
Platelets: 469 10*3/uL — ABNORMAL HIGH (ref 150–400)
RBC: 4.27 MIL/uL (ref 4.22–5.81)
RDW: 18.1 % — ABNORMAL HIGH (ref 11.5–15.5)
WBC: 21.8 10*3/uL — ABNORMAL HIGH (ref 4.0–10.5)
nRBC: 0 % (ref 0.0–0.2)

## 2018-12-07 LAB — BASIC METABOLIC PANEL
Anion gap: 8 (ref 5–15)
BUN: 58 mg/dL — ABNORMAL HIGH (ref 8–23)
CO2: 19 mmol/L — ABNORMAL LOW (ref 22–32)
Calcium: 7.4 mg/dL — ABNORMAL LOW (ref 8.9–10.3)
Chloride: 107 mmol/L (ref 98–111)
Creatinine, Ser: 1.45 mg/dL — ABNORMAL HIGH (ref 0.61–1.24)
GFR calc Af Amer: 48 mL/min — ABNORMAL LOW (ref >60)
GFR calc non Af Amer: 42 mL/min — ABNORMAL LOW (ref >60)
Glucose, Bld: 122 mg/dL — ABNORMAL HIGH (ref 70–99)
Potassium: 4 mmol/L (ref 3.5–5.1)
Sodium: 134 mmol/L — ABNORMAL LOW (ref 135–145)

## 2018-12-07 LAB — HEPARIN LEVEL (UNFRACTIONATED): Heparin Unfractionated: 0.14 IU/mL — ABNORMAL LOW (ref 0.30–0.70)

## 2018-12-07 MED ORDER — VANCOMYCIN 50 MG/ML ORAL SOLUTION: 125.0000 mg | Freq: Four times a day (QID) | ORAL | 0 refills | Status: DC

## 2018-12-07 MED ORDER — ORAL CARE MOUTH RINSE
15.0000 mL | Freq: Two times a day (BID) | OROMUCOSAL | Status: DC
  Administered 2018-12-07: 15 mL via OROMUCOSAL

## 2018-12-07 MED ORDER — HEPARIN BOLUS VIA INFUSION
1800.0000 [IU] | Freq: Once | INTRAVENOUS | Status: AC
  Administered 2018-12-07: 11:00:00 1800 [IU] via INTRAVENOUS
  Filled 2018-12-07: qty 1800

## 2018-12-07 MED ORDER — DOCUSATE SODIUM 100 MG PO CAPS: 100.0000 mg | ORAL_CAPSULE | Freq: Two times a day (BID) | ORAL | 0 refills | Status: AC | PRN

## 2018-12-07 MED ORDER — ENSURE ENLIVE PO LIQD: 237.0000 mL | Freq: Three times a day (TID) | ORAL | 0 refills | Status: AC

## 2018-12-07 MED ORDER — OXYCODONE HCL 5 MG PO TABS: 5.0000 mg | ORAL_TABLET | ORAL | 0 refills | Status: DC | PRN

## 2018-12-07 NOTE — Clinical Social Work Note (Signed)
Patient to be d/c'ed today to [facility].  Patient and family agreeable to plans will transport via ems RN to call report to [phone].  Patient's daughter was at bed side and will notify patient's son that he is leaving today.  Evette Cristal, MSW, LCSW [phone]

## 2018-12-07 NOTE — Progress Notes (Signed)
Pt is being discharged to [facility] room 809 traveling via EMS. Pt is AOx3, his bp=90/53, hr=61 and his  Oxygen saturation is 100% on 4L [place]. AVS with hard copy prescriptions and packet was given to EMS for transport to the recieving facility. Report was called and given to Johnette Abraham, RN and she verbalized understanding of the report given.

## 2018-12-07 NOTE — Progress Notes (Signed)
[place] for Heparin  Indication: atrial fibrillation  Allergies  Allergen Reactions  . Amoxicillin-Pot Clavulanate Swelling    Angioedema Has patient had a PCN reaction causing immediate rash, facial/tongue/throat swelling, SOB or lightheadedness with hypotension: Yes Has patient had a PCN reaction causing severe rash involving mucus membranes or skin necrosis: No Has patient had a PCN reaction that required hospitalization: No Has patient had a PCN reaction occurring within the last 10 years: Yes If all of the above answers are "NO", then may proceed with Cephalosporin use.   Marland Kitchen Fesoterodine Fumarate Er Hives and Rash  . Sulfa Antibiotics Rash    Patient Measurements: Height: 5\' 5"  (165.1 cm) Weight: 134 lb 4.2 oz (60.9 kg) IBW/kg (Calculated) : 61.5 Heparin Dosing Weight:   Vital Signs: Temp: 98.2 F (36.8 C) (02/26 0547) BP: 106/59 (02/26 0547) Pulse Rate: 74 (02/26 0547)  Labs: Recent Labs    12/05/18 0626  12/06/18 0441 12/06/18 1304 12/06/18 2313 12/07/18 0330 12/07/18 0728  HGB 10.0*  --  9.9*  --   --   --  10.9*  HCT 31.4*  --  30.6*  --   --   --  34.3*  PLT 490*  --  465*  --   --   --  469*  APTT 114*   < > 98* 45* 97*  --   --   HEPARINUNFRC 0.97*  --  0.40 0.17* 0.34  --  0.14*  CREATININE 2.00*  --  1.74*  --   --  1.45*  --    < > = values in this interval not displayed.    Estimated Creatinine Clearance: 28.6 mL/min (A) (by C-G formula based on SCr of 1.45 mg/dL (H)).   Medical History: Past Medical History:  Diagnosis Date  . A-fib ([place]) 04/08/2014  . Aortic stenosis   . Cardiomyopathy ([place])   . Cataract   . CVA (cerebral infarction) 2003  . Dysrhythmia   . GERD (gastroesophageal reflux disease)   . Hearing loss   . Hyperlipidemia   . Hypertension 05/04/2014  . Lung mass   . Macular degeneration   . Nephrolithiasis   . Pernicious anemia   . Pneumonia 05/2015  . Prostate cancer [facility]) 2007   performed by Dr. Eliberto Ivory  . Skin cancer   . Spinal stenosis   . Stroke [facility])     Medications:  Medications Prior to Admission  Medication Sig Dispense Refill Last Dose  . acetaminophen (TYLENOL) 650 MG CR tablet Take 650 mg by mouth every 8 (eight) hours as needed for pain.   11/30/2018 at 1303  . amiodarone (PACERONE) 100 MG tablet Take 100 mg by mouth daily.   11/30/2018 at 0923  . atorvastatin (LIPITOR) 20 MG tablet Take 20 mg by mouth daily.   11/29/2018 at 2003  . Cyanocobalamin (B-12) 5000 MCG CAPS Take 5,000 mcg by mouth daily.    11/30/2018 at 0923  . furosemide (LASIX) 20 MG tablet Take 20 mg by mouth daily.    11/30/2018 at 0923  . levothyroxine (SYNTHROID, LEVOTHROID) 50 MCG tablet Take 50 mcg by mouth daily.   11/30/2018 at 0923  . Multiple Vitamin (MULTIVITAMIN WITH MINERALS) TABS tablet Take 1 tablet by mouth daily.   11/30/2018 at 0923  . Multiple Vitamins-Minerals (ICAPS) CAPS Take 1 tablet by mouth daily.   11/30/2018 at 0923  . rivaroxaban (XARELTO) 10 MG TABS tablet TAKE 1 TABLET(10 MG TOTAL) BY MOUTH DAILY WITH DINNER.  11/29/2018 at 2003  . vitamin C (ASCORBIC ACID) 250 MG tablet Take 500 mg by mouth daily.   11/30/2018 at 0923  . zinc sulfate 220 (50 Zn) MG capsule Take 220 mg by mouth daily.   11/30/2018 at 0923  . azelastine (ASTELIN) 0.1 % nasal spray Place 2 sprays into the nose daily as needed for rhinitis or allergies.    prn at prn  . docusate sodium (COLACE) 100 MG capsule Take 100 mg by mouth 2 (two) times daily.   prn at prn  . oxyCODONE (OXY IR/ROXICODONE) 5 MG immediate release tablet Take 1 tablet (5 mg total) by mouth every 4 (four) hours as needed for moderate pain (pain score 4-6). 30 tablet 0 11/28/2018 at 1920  . Polyethylene Glycol 3350 (PEG 3350) POWD Take 17 g by mouth daily.   prn at prn    Assessment: Pharmacy consulted for heparin drip dosing and monitoring in 85 yo male with PMH of A. Fib. Patient is currently ordered Rivaroxaban 15mg  daily. Due to  increasing Scr and declining renal function patient will be transitioned to heparin infusion.  Last rivaroxaban dose was 2/21 @ 1723.  2/22 heparin infusion Started @ 900 units/hr 2/23 @ 0200 aPTT 70 (900 units/hour) 2/23 @ 1021 aPTT 52 (900 units/hour) 2/23 @ 1925 aPTT 103 (1050 units/hour) 2/24 @ 0626 aPTT 114 (1000 units/hr) heparin level 0.97 2/24 @ 1258 aPTT 73 (900 units/hr) - Therapeutic x 1 2/25 @  0500 aPTT 98 (900 units/hr)- heparin level 0.4 2/25 @ 1300  Heparin level- 0.17. Called RN to make sure heparin drip was running. Ordered and called lab with add on APTT.  02/25 @ 0500 aPTT 98 seconds; HL 0.40 both therapeutic and correlating. Will dose off of HL now since both are correlating. Will continue current rate and will recheck HL @ 1300, CBC trending down slightly will continue to monitor. 2/25 @ 1304 aPTT 45 and Heparin level 0.17. Increase drip back to 1000units/hr and repeat levels in 8 hours. 02/25 2313 HL= 0.34, aPTT 97. Drip rate continued. Will check confirmatory HL in am.   Goal of Therapy:  Heparin level 0.3-0.7 units/ml aPTT 66-102 seconds Monitor platelets by anticoagulation protocol: Yes   Plan:  02/26 0728  HL= 0.14. Will order bolus of 1800 units and increase drip to 1200 units/hr. Will check  HL in 8 hours.   Noralee Space, PharmD, BCPS Clinical Pharmacist 12/07/2018 9:25 AM

## 2018-12-07 NOTE — Discharge Instructions (Signed)
Follow-up with primary care physician at the facility in 2 to 3 days Dr. Mike Gip in 10 days Dr. Marry Guan as scheduled or in a week

## 2018-12-07 NOTE — Progress Notes (Signed)
[place] for Heparin  Indication: atrial fibrillation  Allergies  Allergen Reactions  . Amoxicillin-Pot Clavulanate Swelling    Angioedema Has patient had a PCN reaction causing immediate rash, facial/tongue/throat swelling, SOB or lightheadedness with hypotension: Yes Has patient had a PCN reaction causing severe rash involving mucus membranes or skin necrosis: No Has patient had a PCN reaction that required hospitalization: No Has patient had a PCN reaction occurring within the last 10 years: Yes If all of the above answers are "NO", then may proceed with Cephalosporin use.   Marland Kitchen Fesoterodine Fumarate Er Hives and Rash  . Sulfa Antibiotics Rash    Patient Measurements: Height: 5\' 5"  (165.1 cm) Weight: 134 lb 4.2 oz (60.9 kg) IBW/kg (Calculated) : 61.5 Heparin Dosing Weight:   Vital Signs: Temp: 98.2 F (36.8 C) (02/26 0547) BP: 106/59 (02/26 0547) Pulse Rate: 74 (02/26 0547)  Labs: Recent Labs    12/05/18 0626  12/06/18 0441 12/06/18 1304 12/06/18 2313 12/07/18 0330  HGB 10.0*  --  9.9*  --   --   --   HCT 31.4*  --  30.6*  --   --   --   PLT 490*  --  465*  --   --   --   APTT 114*   < > 98* 45* 97*  --   HEPARINUNFRC 0.97*  --  0.40 0.17* 0.34  --   CREATININE 2.00*  --  1.74*  --   --  1.45*   < > = values in this interval not displayed.    Estimated Creatinine Clearance: 28.6 mL/min (A) (by C-G formula based on SCr of 1.45 mg/dL (H)).   Medical History: Past Medical History:  Diagnosis Date  . A-fib ([place]) 04/08/2014  . Aortic stenosis   . Cardiomyopathy ([place])   . Cataract   . CVA (cerebral infarction) 2003  . Dysrhythmia   . GERD (gastroesophageal reflux disease)   . Hearing loss   . Hyperlipidemia   . Hypertension 05/04/2014  . Lung mass   . Macular degeneration   . Nephrolithiasis   . Pernicious anemia   . Pneumonia 05/2015  . Prostate cancer [facility]) 2007   performed by Dr. Eliberto Ivory  . Skin cancer   . Spinal  stenosis   . Stroke [facility])     Medications:  Medications Prior to Admission  Medication Sig Dispense Refill Last Dose  . acetaminophen (TYLENOL) 650 MG CR tablet Take 650 mg by mouth every 8 (eight) hours as needed for pain.   11/30/2018 at 1303  . amiodarone (PACERONE) 100 MG tablet Take 100 mg by mouth daily.   11/30/2018 at 0923  . atorvastatin (LIPITOR) 20 MG tablet Take 20 mg by mouth daily.   11/29/2018 at 2003  . Cyanocobalamin (B-12) 5000 MCG CAPS Take 5,000 mcg by mouth daily.    11/30/2018 at 0923  . furosemide (LASIX) 20 MG tablet Take 20 mg by mouth daily.    11/30/2018 at 0923  . levothyroxine (SYNTHROID, LEVOTHROID) 50 MCG tablet Take 50 mcg by mouth daily.   11/30/2018 at 0923  . Multiple Vitamin (MULTIVITAMIN WITH MINERALS) TABS tablet Take 1 tablet by mouth daily.   11/30/2018 at 0923  . Multiple Vitamins-Minerals (ICAPS) CAPS Take 1 tablet by mouth daily.   11/30/2018 at 0923  . rivaroxaban (XARELTO) 10 MG TABS tablet TAKE 1 TABLET(10 MG TOTAL) BY MOUTH DAILY WITH DINNER.   11/29/2018 at 2003  . vitamin C (ASCORBIC ACID)  250 MG tablet Take 500 mg by mouth daily.   11/30/2018 at 0923  . zinc sulfate 220 (50 Zn) MG capsule Take 220 mg by mouth daily.   11/30/2018 at 0923  . azelastine (ASTELIN) 0.1 % nasal spray Place 2 sprays into the nose daily as needed for rhinitis or allergies.    prn at prn  . docusate sodium (COLACE) 100 MG capsule Take 100 mg by mouth 2 (two) times daily.   prn at prn  . oxyCODONE (OXY IR/ROXICODONE) 5 MG immediate release tablet Take 1 tablet (5 mg total) by mouth every 4 (four) hours as needed for moderate pain (pain score 4-6). 30 tablet 0 11/28/2018 at 1920  . Polyethylene Glycol 3350 (PEG 3350) POWD Take 17 g by mouth daily.   prn at prn    Assessment: Pharmacy consulted for heparin drip dosing and monitoring in 85 yo male with PMH of A. Fib. Patient is currently ordered Rivaroxaban 15mg  daily. Due to increasing Scr and declining renal function patient will  be transitioned to heparin infusion.  Last rivaroxaban dose was 2/21 @ 1723.  2/22 heparin infusion Started @ 900 units/hr 2/23 @ 0200 aPTT 70 (900 units/hour) 2/23 @ 1021 aPTT 52 (900 units/hour) 2/23 @ 1925 aPTT 103 (1050 units/hour) 2/24 @ 0626 aPTT 114 (1000 units/hr) heparin level 0.97 2/24 @ 1258 aPTT 73 (900 units/hr) - Therapeutic x 1 2/25 @  0500 aPTT 98 (900 units/hr)- heparin level 0.4 2/25 @ 1300  Heparin level- 0.17. Called RN to make sure heparin drip was running. Ordered and called lab with add on APTT.  02/25 @ 0500 aPTT 98 seconds; HL 0.40 both therapeutic and correlating. Will dose off of HL now since both are correlating. Will continue current rate and will recheck HL @ 1300, CBC trending down slightly will continue to monitor. 2/25 @ 1304 aPTT 45 and Heparin level 0.17. Increase drip back to 1000units/hr and repeat levels in 8 hours.   Goal of Therapy:  Heparin level 0.3-0.7 units/ml aPTT 66-102 seconds Monitor platelets by anticoagulation protocol: Yes   Plan:  02/25 2313 HL= 0.34, aPTT 97. Drip rate continued. Will check confirmatory HL in am. CBC trending down slightly will continue to monitor.  Noralee Space, PharmD, BCPS Clinical Pharmacist 12/07/2018 7:04 AM

## 2018-12-07 NOTE — Progress Notes (Signed)
Pt has been hypotensive (bp 56'H systolic), and appears to not be doing well but has discharge orders. He is very edematous in his BUE, BLE as well as his scrotum. Gouru, MD was notified regarding hypotension at 1246, leaking of the pt's suprapubic catheter and increased edema and she came to the pt's bedside to assess him and spoke with the family. Orders were given to repeat the bp and change the suprapubic catheter and this task was completed.  Repeat bp was 107/64 with map of 75. Discharge orders remained. Pt again became hypotensive at 1646 with hight bp reading 90/53, Gouru, MD was page and RN was told that was his baseline bp and to proceed with discharge. Report will be called to the facility and EMS will be called. Family is at the bedside and will be notified. We will continue to monitor the pt.

## 2018-12-07 NOTE — Clinical Social Work Placement (Signed)
   CLINICAL SOCIAL WORK PLACEMENT  NOTE  Date:  12/07/2018  Patient Details  Name: Matthew Hicks. MRN: [id number] Date of Birth: [birth year]  Clinical Social Work is seeking post-discharge placement for this patient at the [place] level of care (*CSW will initial, date and re-position this form in  chart as items are completed):  Yes   Patient/family provided with [place] Work Department's list of facilities offering this level of care within the geographic area requested by the patient (or if unable, by the patient's family).  Yes   Patient/family informed of their freedom to choose among providers that offer the needed level of care, that participate in Medicare, Medicaid or managed care program needed by the patient, have an available bed and are willing to accept the patient.  Yes   Patient/family informed of [facility]'s ownership interest in [facility] and [facility], as well as of the fact that they are under no obligation to receive care at these facilities.  PASRR submitted to EDS on 12/06/18     PASRR number received on       Existing PASRR number confirmed on 12/06/18     FL2 transmitted to all facilities in geographic area requested by pt/family on 12/06/18     FL2 transmitted to all facilities within larger geographic area on       Patient informed that his/her managed care company has contracts with or will negotiate with certain facilities, including the following:        Yes   Patient/family informed of bed offers received.  Patient chooses bed at [facility]     Physician recommends and patient chooses bed at      Patient to be transferred to [facility] [place] on 12/07/18.  Patient to be transferred to facility by [facility]     Patient family notified on 12/07/18 of transfer.  Name of family member notified:  Patient's daughter was at bedside     PHYSICIAN Please sign DNR, Please  sign FL2, Please prepare prescriptions     Additional Comment:    _______________________________________________ Ross Ludwig, LCSW 12/07/2018, 3:55 PM

## 2018-12-07 NOTE — Progress Notes (Signed)
[facility]  ROUNDING NOTE   Subjective:  Renal function continues to improve. BUN down to 58 with a creatinine of 1.45. Serum sodium also up to 134.   Objective:  Vital signs in last 24 hours:  Temp:  [98.1 F (36.7 C)-98.6 F (37 C)] 98.1 F (36.7 C) (02/26 1116) Pulse Rate:  [74-95] 88 (02/26 1116) Resp:  [18-20] 20 (02/26 0547) BP: (92-139)/(59-64) 92/60 (02/26 1116) SpO2:  [83 %-100 %] 100 % (02/26 1116) Weight:  [60.9 kg] 60.9 kg (02/26 0559)  Weight change:  Filed Weights   11/30/18 1732 12/07/18 0559  Weight: 61 kg 60.9 kg    Intake/Output: I/O last 3 completed shifts: In: 1096.5 [P.O.:237; I.V.:762.9; IV Piggyback:96.6] Out: 1400 [Urine:1400]   Intake/Output this shift:  Total I/O In: 3 [I.V.:3] Out: -   Physical Exam: General: No acute distress  Head: Normocephalic, atraumatic. Moist oral mucosal membranes  Eyes: Anicteric  Neck: Supple, trachea midline  Lungs:  Clear to auscultation, normal effort  Heart: S1S2 no rubs  Abdomen:  Soft, nontender, bowel sounds present  Extremities: 2+ peripheral edema.  Neurologic: Awake, alert, hard of hearing  Skin: LLE with erythema       Basic Metabolic Panel: Recent Labs  Lab 12/03/18 0509 12/04/18 0546 12/05/18 0626 12/06/18 0441 12/07/18 0330  NA 131* 129* 130* 131* 134*  K 4.2 4.0 3.9 3.7 4.0  CL 103 103 103 107 107  CO2 18* 18* 17* 21* 19*  GLUCOSE 96 94 89 113* 122*  BUN 67* 71* 72* 63* 58*  CREATININE 2.30* 2.05* 2.00* 1.74* 1.45*  CALCIUM 6.9* 6.6* 6.7* 6.8* 7.4*  PHOS  --   --  3.6  --   --     Liver Function Tests: Recent Labs  Lab 11/30/18 1745 12/01/18 0537  AST 39 30  ALT 47* 35  ALKPHOS 119 130*  BILITOT 2.0* 1.3*  PROT 5.3* 4.6*  ALBUMIN 2.4* 2.0*   Recent Labs  Lab 11/30/18 1745  LIPASE 18   No results for input(s): AMMONIA in the last 168 hours.  CBC: Recent Labs  Lab 12/02/18 0332 12/03/18 0509 12/04/18 0546 12/05/18 0626 12/06/18 0441  12/07/18 0728  WBC 52.5*  52.1* 43.8* 32.9* 23.3* 19.2* 21.8*  NEUTROABS 45.9*  --   --   --   --   --   HGB 11.1*  11.1* 10.5* 10.7* 10.0* 9.9* 10.9*  HCT 34.2*  34.4* 32.4* 32.8* 31.4* 30.6* 34.3*  MCV 79.2*  78.5* 78.6* 77.5* 79.7* 78.5* 80.3  PLT 671*  686* 605* 562* 490* 465* 469*    Cardiac Enzymes: Recent Labs  Lab 11/30/18 1745 12/01/18 1223  TROPONINI 0.09* <0.03    BNP: Invalid input(s): POCBNP  CBG: No results for input(s): GLUCAP in the last 168 hours.  Microbiology: Results for orders placed or performed during the hospital encounter of 11/30/18  Urine Culture     Status: Abnormal   Collection Time: 11/30/18  5:45 PM  Result Value Ref Range Status   Specimen Description   Final    URINE, RANDOM Performed at [facility], [address], [place], [place] [address]    Special Requests   Final    NONE Performed at [facility], [address] [place], [place] [address]    Culture >=100,000 COLONIES/mL PROTEUS MIRABILIS (A)  Final   Report Status 12/04/2018 FINAL  Final   Organism ID, Bacteria PROTEUS MIRABILIS (A)  Final      Susceptibility  Proteus mirabilis - MIC*    AMPICILLIN <=2 SENSITIVE Sensitive     CEFAZOLIN <=4 SENSITIVE Sensitive     CEFTRIAXONE <=1 SENSITIVE Sensitive     CIPROFLOXACIN 1 SENSITIVE Sensitive     GENTAMICIN <=1 SENSITIVE Sensitive     IMIPENEM 4 SENSITIVE Sensitive     NITROFURANTOIN 128 RESISTANT Resistant     TRIMETH/SULFA <=20 SENSITIVE Sensitive     AMPICILLIN/SULBACTAM <=2 SENSITIVE Sensitive     PIP/TAZO <=4 SENSITIVE Sensitive     * >=100,000 COLONIES/mL PROTEUS MIRABILIS  Blood culture (routine x 2)     Status: None   Collection Time: 11/30/18  5:46 PM  Result Value Ref Range Status   Specimen Description BLOOD  Final   Special Requests NONE  Final   Culture   Final    NO GROWTH 5 DAYS Performed at [facility], [address] [place], [place] [address]    Report Status  12/05/2018 FINAL  Final  Blood culture (routine x 2)     Status: None   Collection Time: 11/30/18  5:46 PM  Result Value Ref Range Status   Specimen Description BLOOD RIGHT ANTECUBITAL  Final   Special Requests   Final    BOTTLES DRAWN AEROBIC AND ANAEROBIC Blood Culture adequate volume   Culture   Final    NO GROWTH 5 DAYS Performed at [facility], [address] [place], [place] [address]    Report Status 12/05/2018 FINAL  Final  Gastrointestinal Panel by PCR , Stool     Status: None   Collection Time: 12/01/18  5:05 AM  Result Value Ref Range Status   Campylobacter species NOT DETECTED NOT DETECTED Final   Plesimonas shigelloides NOT DETECTED NOT DETECTED Final   Salmonella species NOT DETECTED NOT DETECTED Final   Yersinia enterocolitica NOT DETECTED NOT DETECTED Final   Vibrio species NOT DETECTED NOT DETECTED Final   Vibrio cholerae NOT DETECTED NOT DETECTED Final   Enteroaggregative E coli (EAEC) NOT DETECTED NOT DETECTED Final   Enteropathogenic E coli (EPEC) NOT DETECTED NOT DETECTED Final   Enterotoxigenic E coli (ETEC) NOT DETECTED NOT DETECTED Final   Shiga like toxin producing E coli (STEC) NOT DETECTED NOT DETECTED Final   Shigella/Enteroinvasive E coli (EIEC) NOT DETECTED NOT DETECTED Final   Cryptosporidium NOT DETECTED NOT DETECTED Final   Cyclospora cayetanensis NOT DETECTED NOT DETECTED Final   Entamoeba histolytica NOT DETECTED NOT DETECTED Final   Giardia lamblia NOT DETECTED NOT DETECTED Final   Adenovirus F40/41 NOT DETECTED NOT DETECTED Final   Astrovirus NOT DETECTED NOT DETECTED Final   Norovirus GI/GII NOT DETECTED NOT DETECTED Final   Rotavirus A NOT DETECTED NOT DETECTED Final   Sapovirus (I, II, IV, and V) NOT DETECTED NOT DETECTED Final    Comment: Performed at [facility], [address] [place], [place] [address]  C difficile quick scan w PCR reflex     Status: Abnormal   Collection Time: 12/01/18  5:05 AM  Result Value  Ref Range Status   C Diff antigen POSITIVE (A) NEGATIVE Final   C Diff toxin POSITIVE (A) NEGATIVE Final   C Diff interpretation Toxin producing C. difficile detected.  Final    Comment: CRITICAL RESULT CALLED TO, READ BACK BY AND VERIFIED WITH: Liana Crocker AT 6789 ON 12/01/18 JJB Performed at [facility], [address] [place], [place] [address]     Coagulation Studies: No results for input(s): LABPROT, INR in the last  72 hours.  Urinalysis: No results for input(s): COLORURINE, LABSPEC, PHURINE, GLUCOSEU, HGBUR, BILIRUBINUR, KETONESUR, PROTEINUR, UROBILINOGEN, NITRITE, LEUKOCYTESUR in the last 72 hours.  Invalid input(s): APPERANCEUR    Imaging: US Renal  Result Date: 12/05/2018 CLINICAL DATA:  Acute kidney failure EXAM: RENAL / URINARY TRACT ULTRASOUND COMPLETE COMPARISON:  None. FINDINGS: Right Kidney: Renal measurements: 9.1 x 4.3 x 5 cm = volume: 102 mL . Echogenicity within normal limits. 11 mm cysts within the upper pole. No suspicious mass or hydronephrosis visualized. Left Kidney: Renal measurements: 9.9 x 4.7 x 4.3 cm = volume: 106 mL. Echogenicity within normal limits. No mass or hydronephrosis visualized. Bladder: Decompressed by a suprapubic catheter. Incidental note made of ascites within the abdomen and pelvis. IMPRESSION: 1. No acute or significant renal findings.  No hydronephrosis. 2. Bladder decompressed by a suprapubic catheter. 3. Ascites. Electronically Signed   By: Franki Cabot M.D.   On: 12/05/2018 16:16     Medications:   . sodium chloride 40 mL/hr at 12/06/18 2225  . albumin human 12.5 g (12/07/18 1125)  . heparin 1,200 Units/hr (12/07/18 1119)   . amiodarone  100 mg Oral Daily  . feeding supplement (ENSURE ENLIVE)  237 mL Oral TID BM  . levothyroxine  50 mcg Oral Q0600  . mouth rinse  15 mL Mouth Rinse BID  . sodium chloride flush  3 mL Intravenous Q12H  . vancomycin  125 mg Oral QID   acetaminophen, azelastine, ondansetron **OR**  ondansetron (ZOFRAN) IV, oxyCODONE, sodium chloride flush, traMADol  Assessment/ Plan:  85 y.o. male  with medical problems of atrial fibrillation, aortic stenosis, history of stroke, chronic suprapubic catheter, was admitted on 11/30/2018 with fever and is found to have Proteus UTI, C. difficile colitis, hyponatremia and acute renal failure.   1.  Acute renal failure/CKD stage III, baseline creatinine of 1.17/GFR 54 from November 20, 2018 Patient has nonoliguric renal failure likely secondary to ATN.  Multiple factors including ongoing infections, hypotension, intravascular hypovolemia 2.  Hyponatremia, generalized edema Likely dilutional from total body volume overload TSH normal 3.  Urinary tract infection, C. difficile colitis 4.  Metabolic acidosis.  5.  Anemia of chronic kidney disease.  Plan: Kidney function appears to be improving.  BUN currently 58 with a creatinine 1.45.  Okay to stop IV fluids upon discharge.  Hyponatremia improved as serum sodium up to 134.  Patient also has anemia of chronic kidney disease with most recent hemoglobin of 10.9.  No indication for Epogen at the moment.   LOS: 7 Matthew Hicks 2/26/202011:33 AM

## 2018-12-07 NOTE — Discharge Summary (Signed)
[facility] [place] NAME: Matthew Hicks    MR#:  [id number]  DATE OF BIRTH:  1925/01/30  DATE OF ADMISSION:  11/30/2018 ADMITTING PHYSICIAN: Nicholes Mango, MD  DATE OF DISCHARGE:  12/07/18   PRIMARY CARE PHYSICIAN: Kirk Ruths, MD    ADMISSION DIAGNOSIS:  Hyponatremia [E87.1] Acute renal insufficiency [N28.9] Elevated troponin [R79.89] Proctocolitis with complication [J50.0] Acute on chronic congestive heart failure, unspecified heart failure type ([place]) [I50.9] Sepsis, due to unspecified organism, unspecified whether acute organ dysfunction present ([place]) [A41.9]  DISCHARGE DIAGNOSIS:   Severe protein energy malnutrition  C. difficile colitis SECONDARY DIAGNOSIS:   Past Medical History:  Diagnosis Date  . A-fib ([place]) 04/08/2014  . Aortic stenosis   . Cardiomyopathy ([place])   . Cataract   . CVA (cerebral infarction) 2003  . Dysrhythmia   . GERD (gastroesophageal reflux disease)   . Hearing loss   . Hyperlipidemia   . Hypertension 05/04/2014  . Lung mass   . Macular degeneration   . Nephrolithiasis   . Pernicious anemia   . Pneumonia 05/2015  . Prostate cancer [facility]) 2007   performed by Dr. Eliberto Ivory  . Skin cancer   . Spinal stenosis   . Stroke [facility])     HOSPITAL COURSE:    Acute kidney injury- likely due to dehydration in the setting of diarrhea.  Creatinine worsening. 1.7-2.3-2.05-2.0-1.7-1.4 IV albumin -Provided IV fluids,  pharmacy  renal dosed all medications including vancomycin -Avoid nephrotoxic agents -Okay to discharge patient from nephrology standpoint  C. difficile colitis- lactic acidosis has improved, but with worsening leukocytosis -Continue p.o. vancomycin, complete the course -IV fluids provided  Acute cystitis patient does have a suprapubic catheter. -No urine culture ordered on admission -urine culture-with Proteus mirabilis, sensitive to ciprofloxacin Treated with ciprofloxacin but  according to ID pharmacist Proteus mirabilis is probably from colonization and in view of C. difficile colitis which might get worse ciprofloxacin was discontinued.  Patient is clinically doing fine.  Leukocytosis-probably reactive as well as from C. difficile colitis and acute cystitis Started trending down WBC 52.5-43.8-32.8-23.3 -19.2  Hematology Dr. Mike Gip is following.  Patient to follow-up with her as an outpatient after discharge Peripheral smear with neutrophilia and left shift  Right upper extremity edema- new this morning -Negative right upper extremity Doppler ultrasound -rule out DVT -Continue Xarelto  Severe protein energy malnutrition Recommending to liberalize diet to regular diet Ensure Enlive p.o. 3 times daily  Elevated troponin- likely due to demand ischemia.  Resolved -Monitor   Hyponatremia- secondary to dehydration from diarrhea.  Sodium improving. -Given IV fluids Sodium-131-129-130-131 Nephrology following and recommending IV albumin for another day  Recent history of left intertrochanteric fracture -Pain control -Seen by orthopedics staples removed 12/03/2018 ,outpatient follow-up with Dr. Marry Guan in 4 weeks -PT consult- rehab  Microcytic anemia- likely due to iron deficiency -Anemia panel ordered -Hematology to see op-Dr. Mike Gip  Chronic atrial fibrillation-rate controlled -Continue amiodarone -Xarelto changed to IV heparin in view of acute renal insufficiency.  Now renal function is significantly better will resume his Xarelto and discontinue IV heparin  Hypothyroidism-stable -Continue Synthroid  Discharge to Synthyroid DISCHARGE CONDITIONS:  ok   CONSULTS OBTAINED:  Treatment Team:  Dereck Leep, MD Murlean Iba, MD   PROCEDURES staple removal from the left hip and left knee Suprapubic catheter changed with a new cath  DRUG ALLERGIES:   Allergies  Allergen Reactions  . Amoxicillin-Pot Clavulanate Swelling     Angioedema  Has patient had a PCN reaction causing immediate rash, facial/tongue/throat swelling, SOB or lightheadedness with hypotension: Yes Has patient had a PCN reaction causing severe rash involving mucus membranes or skin necrosis: No Has patient had a PCN reaction that required hospitalization: No Has patient had a PCN reaction occurring within the last 10 years: Yes If all of the above answers are "NO", then may proceed with Cephalosporin use.   Marland Kitchen Fesoterodine Fumarate Er Hives and Rash  . Sulfa Antibiotics Rash    DISCHARGE MEDICATIONS:   Allergies as of 12/07/2018      Reactions   Amoxicillin-pot Clavulanate Swelling   Angioedema Has patient had a PCN reaction causing immediate rash, facial/tongue/throat swelling, SOB or lightheadedness with hypotension: Yes Has patient had a PCN reaction causing severe rash involving mucus membranes or skin necrosis: No Has patient had a PCN reaction that required hospitalization: No Has patient had a PCN reaction occurring within the last 10 years: Yes If all of the above answers are "NO", then may proceed with Cephalosporin use.   Fesoterodine Fumarate Er Hives, Rash   Sulfa Antibiotics Rash      Medication List    STOP taking these medications   furosemide 20 MG tablet Commonly known as:  LASIX     TAKE these medications   acetaminophen 650 MG CR tablet Commonly known as:  TYLENOL Take 650 mg by mouth every 8 (eight) hours as needed for pain.   amiodarone 100 MG tablet Commonly known as:  PACERONE Take 100 mg by mouth daily.   atorvastatin 20 MG tablet Commonly known as:  LIPITOR Take 20 mg by mouth daily.   azelastine 0.1 % nasal spray Commonly known as:  ASTELIN Place 2 sprays into the nose daily as needed for rhinitis or allergies.   B-12 5000 MCG Caps Take 5,000 mcg by mouth daily.   docusate sodium 100 MG capsule Commonly known as:  COLACE Take 1 capsule (100 mg total) by mouth 2 (two) times daily as needed for  mild constipation. What changed:    when to take this  reasons to take this   feeding supplement (ENSURE ENLIVE) Liqd Take 237 mLs by mouth 3 (three) times daily between meals.   ICAPS Caps Take 1 tablet by mouth daily.   levothyroxine 50 MCG tablet Commonly known as:  SYNTHROID, LEVOTHROID Take 50 mcg by mouth daily.   multivitamin with minerals Tabs tablet Take 1 tablet by mouth daily.   oxyCODONE 5 MG immediate release tablet Commonly known as:  Oxy IR/ROXICODONE Take 1 tablet (5 mg total) by mouth every 4 (four) hours as needed for moderate pain (pain score 4-6).   PEG 3350 Powd Take 17 g by mouth daily.   vancomycin 50 mg/mL  oral solution Commonly known as:  VANCOCIN Take 2.5 mLs (125 mg total) by mouth 4 (four) times daily for 7 days.   vitamin C 250 MG tablet Commonly known as:  ASCORBIC ACID Take 500 mg by mouth daily.   XARELTO 10 MG Tabs tablet Generic drug:  rivaroxaban TAKE 1 TABLET(10 MG TOTAL) BY MOUTH DAILY WITH DINNER.   zinc sulfate 220 (50 Zn) MG capsule Take 220 mg by mouth daily.        DISCHARGE INSTRUCTIONS:  Follow-up with primary care physician at the facility in 2 to 3 days Dr. Mike Gip in 10 days Dr. Marry Guan as scheduled or in a week   DIET:  Cardiac diet  DISCHARGE CONDITION:  Fair  ACTIVITY:  Activity as tolerated per PT  OXYGEN:  Home Oxygen: No.   Oxygen Delivery: room air  DISCHARGE LOCATION:  nursing home   If you experience worsening of your admission symptoms, develop shortness of breath, life threatening emergency, suicidal or homicidal thoughts you must seek medical attention immediately by calling 911 or calling your MD immediately  if symptoms less severe.  You Must read complete instructions/literature along with all the possible adverse reactions/side effects for all the Medicines you take and that have been prescribed to you. Take any new Medicines after you have completely understood and accpet all the  possible adverse reactions/side effects.   Please note  You were cared for by a hospitalist during your hospital stay. If you have any questions about your discharge medications or the care you received while you were in the hospital after you are discharged, you can call the unit and asked to speak with the hospitalist on call if the hospitalist that took care of you is not available. Once you are discharged, your primary care physician will handle any further medical issues. Please note that NO REFILLS for any discharge medications will be authorized once you are discharged, as it is imperative that you return to your primary care physician (or establish a relationship with a primary care physician if you do not have one) for your aftercare needs so that they can reassess your need for medications and monitor your lab values.     Today  Chief Complaint  Patient presents with  . Shortness of Breath   Patient is feeling, needs rehabilitation.  [facility].  Daughter at bedside agreeable Suprapubic catheter will be changed prior to the discharge.  Okay to discharge patient from orthopedic standpoint after staple removal from the left knee  ROS:  CONSTITUTIONAL: Denies fevers, chills. Denies any fatigue, weakness.  EYES: Denies blurry vision, double vision, eye pain. EARS, NOSE, THROAT: Denies tinnitus, ear pain, hearing loss. RESPIRATORY: Denies cough, wheeze, shortness of breath.  CARDIOVASCULAR: Denies chest pain, palpitations, edema.  GASTROINTESTINAL: Denies nausea, vomiting, diarrhea, abdominal pain. Denies bright red blood per rectum. GENITOURINARY: Denies dysuria, hematuria. ENDOCRINE: Denies nocturia or thyroid problems. HEMATOLOGIC AND LYMPHATIC: Denies easy bruising or bleeding. SKIN: Denies rash or lesion. MUSCULOSKELETAL: Denies pain in neck, back, shoulder, knees, hips or arthritic symptoms.  NEUROLOGIC: Denies paralysis,  paresthesias.  PSYCHIATRIC: Denies anxiety or depressive symptoms.   VITAL SIGNS:  Blood pressure 92/60, pulse 88, temperature 98.1 F (36.7 C), resp. rate 20, height 5\' 5"  (1.651 m), weight 60.9 kg, SpO2 100 %.  I/O:    Intake/Output Summary (Last 24 hours) at 12/07/2018 1445 Last data filed at 12/07/2018 1122 Gross per 24 hour  Intake 1099.46 ml  Output 500 ml  Net 599.46 ml    PHYSICAL EXAMINATION:  GENERAL:  85 y.o.-year-old patient lying in the bed with no acute distress.  EYES: Pupils equal, round, reactive to light and accommodation. No scleral icterus. Extraocular muscles intact.  HEENT: Head atraumatic, normocephalic. Oropharynx and nasopharynx clear.  NECK:  Supple, no jugular venous distention. No thyroid enlargement, no tenderness.  LUNGS: Normal breath sounds bilaterally, no wheezing, rales,rhonchi or crepitation. No use of accessory muscles of respiration.  CARDIOVASCULAR: S1, S2 normal. No murmurs, rubs, or gallops.  ABDOMEN: Soft, non-tender, non-distended. Bowel sounds present.  EXTREMITIES: Left hip incision healing well, no pedal edema, cyanosis, or clubbing.  NEUROLOGIC: Awake, alert and oriented x3 sensation intact. Gait not  checked.  PSYCHIATRIC: The patient is alert and oriented x 3.  SKIN: No obvious rash, lesion, or ulcer.   DATA REVIEW:   CBC Recent Labs  Lab 12/07/18 0728  WBC 21.8*  HGB 10.9*  HCT 34.3*  PLT 469*    Chemistries  Recent Labs  Lab 12/01/18 0537  12/07/18 0330  NA 128*   < > 134*  K 3.9   < > 4.0  CL 96*   < > 107  CO2 21*   < > 19*  GLUCOSE 102*   < > 122*  BUN 38*   < > 58*  CREATININE 1.73*   < > 1.45*  CALCIUM 7.2*   < > 7.4*  AST 30  --   --   ALT 35  --   --   ALKPHOS 130*  --   --   BILITOT 1.3*  --   --    < > = values in this interval not displayed.    Cardiac Enzymes Recent Labs  Lab 12/01/18 1223  TROPONINI <0.03    Microbiology Results  Results for orders placed or performed during the hospital  encounter of 11/30/18  Urine Culture     Status: Abnormal   Collection Time: 11/30/18  5:45 PM  Result Value Ref Range Status   Specimen Description   Final    URINE, RANDOM Performed at [facility], [address] [place], [place] [address]    Special Requests   Final    NONE Performed at [facility], [address] [place], [place] [address]    Culture >=100,000 COLONIES/mL PROTEUS MIRABILIS (A)  Final   Report Status 12/04/2018 FINAL  Final   Organism ID, Bacteria PROTEUS MIRABILIS (A)  Final      Susceptibility   Proteus mirabilis - MIC*    AMPICILLIN <=2 SENSITIVE Sensitive     CEFAZOLIN <=4 SENSITIVE Sensitive     CEFTRIAXONE <=1 SENSITIVE Sensitive     CIPROFLOXACIN 1 SENSITIVE Sensitive     GENTAMICIN <=1 SENSITIVE Sensitive     IMIPENEM 4 SENSITIVE Sensitive     NITROFURANTOIN 128 RESISTANT Resistant     TRIMETH/SULFA <=20 SENSITIVE Sensitive     AMPICILLIN/SULBACTAM <=2 SENSITIVE Sensitive     PIP/TAZO <=4 SENSITIVE Sensitive     * >=100,000 COLONIES/mL PROTEUS MIRABILIS  Blood culture (routine x 2)     Status: None   Collection Time: 11/30/18  5:46 PM  Result Value Ref Range Status   Specimen Description BLOOD  Final   Special Requests NONE  Final   Culture   Final    NO GROWTH 5 DAYS Performed at [facility], [address] [place], [place] [address]    Report Status 12/05/2018 FINAL  Final  Blood culture (routine x 2)     Status: None   Collection Time: 11/30/18  5:46 PM  Result Value Ref Range Status   Specimen Description BLOOD RIGHT ANTECUBITAL  Final   Special Requests   Final    BOTTLES DRAWN AEROBIC AND ANAEROBIC Blood Culture adequate volume   Culture   Final    NO GROWTH 5 DAYS Performed at [facility], [address], [place],  [address]    Report Status 12/05/2018 FINAL  Final  Gastrointestinal Panel by PCR , Stool     Status: None   Collection Time: 12/01/18  5:05 AM  Result Value Ref  Range Status   Campylobacter species NOT DETECTED NOT DETECTED  Final   Plesimonas shigelloides NOT DETECTED NOT DETECTED Final   Salmonella species NOT DETECTED NOT DETECTED Final   Yersinia enterocolitica NOT DETECTED NOT DETECTED Final   Vibrio species NOT DETECTED NOT DETECTED Final   Vibrio cholerae NOT DETECTED NOT DETECTED Final   Enteroaggregative E coli (EAEC) NOT DETECTED NOT DETECTED Final   Enteropathogenic E coli (EPEC) NOT DETECTED NOT DETECTED Final   Enterotoxigenic E coli (ETEC) NOT DETECTED NOT DETECTED Final   Shiga like toxin producing E coli (STEC) NOT DETECTED NOT DETECTED Final   Shigella/Enteroinvasive E coli (EIEC) NOT DETECTED NOT DETECTED Final   Cryptosporidium NOT DETECTED NOT DETECTED Final   Cyclospora cayetanensis NOT DETECTED NOT DETECTED Final   Entamoeba histolytica NOT DETECTED NOT DETECTED Final   Giardia lamblia NOT DETECTED NOT DETECTED Final   Adenovirus F40/41 NOT DETECTED NOT DETECTED Final   Astrovirus NOT DETECTED NOT DETECTED Final   Norovirus GI/GII NOT DETECTED NOT DETECTED Final   Rotavirus A NOT DETECTED NOT DETECTED Final   Sapovirus (I, II, IV, and V) NOT DETECTED NOT DETECTED Final    Comment: Performed at [facility] [facility], [address] [place], [place] [address]  C difficile quick scan w PCR reflex     Status: Abnormal   Collection Time: 12/01/18  5:05 AM  Result Value Ref Range Status   C Diff antigen POSITIVE (A) NEGATIVE Final   C Diff toxin POSITIVE (A) NEGATIVE Final   C Diff interpretation Toxin producing C. difficile detected.  Final    Comment: CRITICAL RESULT CALLED TO, READ BACK BY AND VERIFIED WITH: Liana Crocker AT 9326 ON 12/01/18 JJB Performed at [facility], [address] [place], [place] [address]     RADIOLOGY:  US Renal  Result Date: 12/05/2018 CLINICAL DATA:  Acute kidney failure EXAM: RENAL / URINARY TRACT ULTRASOUND COMPLETE COMPARISON:  None. FINDINGS: Right Kidney: Renal measurements:  9.1 x 4.3 x 5 cm = volume: 102 mL . Echogenicity within normal limits. 11 mm cysts within the upper pole. No suspicious mass or hydronephrosis visualized. Left Kidney: Renal measurements: 9.9 x 4.7 x 4.3 cm = volume: 106 mL. Echogenicity within normal limits. No mass or hydronephrosis visualized. Bladder: Decompressed by a suprapubic catheter. Incidental note made of ascites within the abdomen and pelvis. IMPRESSION: 1. No acute or significant renal findings.  No hydronephrosis. 2. Bladder decompressed by a suprapubic catheter. 3. Ascites. Electronically Signed   By: Franki Cabot M.D.   On: 12/05/2018 16:16    EKG:   Orders placed or performed during the hospital encounter of 11/30/18  . EKG 12-Lead  . EKG 12-Lead      Management plans discussed with the patient, family and they are in agreement.  CODE STATUS:     Code Status Orders  (From admission, onward)         Start     Ordered   11/30/18 2249  Do not attempt resuscitation (DNR)  Continuous    Question Answer Comment  In the event of cardiac or respiratory ARREST Do not call a "code blue"   In the event of cardiac or respiratory ARREST Do not perform Intubation, CPR, defibrillation or ACLS   In the event of cardiac or respiratory ARREST Use medication by any route, position, wound care, and other measures to relive pain and suffering. May use oxygen, suction and manual treatment of airway obstruction as needed for comfort.   Comments RN may pronounce  11/30/18 2249        Code Status History    Date Active Date Inactive Code Status Order ID Comments User Context   11/16/2018 1739 11/20/2018 2031 Full Code [id number]  Nicholes Mango, MD Inpatient   05/23/2017 2213 05/27/2017 1734 Full Code [id number]  Henreitta Leber, MD Inpatient    Advance Directive Documentation     Most Recent Value  Type of Advance Directive  Healthcare Power of [place], Living will, Out of facility DNR (pink MOST or yellow form)  Pre-existing out of  facility DNR order (yellow form or pink MOST form)  Pink MOST form placed in chart (order not valid for inpatient use)  "MOST" Form in Place?  -     TOTAL TIME TAKING CARE OF THIS PATIENT: 45  minutes.   Note: This dictation was prepared with Dragon dictation along with smaller phrase technology. Any transcriptional errors that result from this process are unintentional.   @[facility] @  on 12/07/2018 at 2:45 PM  Between 7am to 6pm - Pager - [phone]  After 6pm go to [email or web address] - password [email or web address] [facility]  [facility]  Office  [phone]  CC: Primary care physician; Kirk Ruths, MD

## 2018-12-11 ENCOUNTER — Emergency Department: Payer: Medicare Other

## 2018-12-11 ENCOUNTER — Inpatient Hospital Stay
Admission: EM | Admit: 2018-12-11 | Discharge: 2018-12-13 | DRG: 641 | Disposition: A | Discharge status: Hospice | Payer: Medicare Other | Source: Skilled Nursing Facility | Attending: Internal Medicine | Admitting: Internal Medicine

## 2018-12-11 ENCOUNTER — Other Ambulatory Visit: Payer: Self-pay

## 2018-12-11 DIAGNOSIS — I5042 Chronic combined systolic (congestive) and diastolic (congestive) heart failure: Secondary | ICD-10-CM | POA: Diagnosis present

## 2018-12-11 DIAGNOSIS — I429 Cardiomyopathy, unspecified: Secondary | ICD-10-CM | POA: Diagnosis present

## 2018-12-11 DIAGNOSIS — Z820 Family history of epilepsy and other diseases of the nervous system: Secondary | ICD-10-CM

## 2018-12-11 DIAGNOSIS — Z79890 Hormone replacement therapy (postmenopausal): Secondary | ICD-10-CM

## 2018-12-11 DIAGNOSIS — E785 Hyperlipidemia, unspecified: Secondary | ICD-10-CM | POA: Diagnosis present

## 2018-12-11 DIAGNOSIS — I350 Nonrheumatic aortic (valve) stenosis: Secondary | ICD-10-CM | POA: Diagnosis present

## 2018-12-11 DIAGNOSIS — R609 Edema, unspecified: Secondary | ICD-10-CM

## 2018-12-11 DIAGNOSIS — Z85828 Personal history of other malignant neoplasm of skin: Secondary | ICD-10-CM

## 2018-12-11 DIAGNOSIS — J90 Pleural effusion, not elsewhere classified: Secondary | ICD-10-CM | POA: Diagnosis present

## 2018-12-11 DIAGNOSIS — Z87891 Personal history of nicotine dependence: Secondary | ICD-10-CM

## 2018-12-11 DIAGNOSIS — L89890 Pressure ulcer of other site, unstageable: Secondary | ICD-10-CM | POA: Diagnosis present

## 2018-12-11 DIAGNOSIS — I110 Hypertensive heart disease with heart failure: Secondary | ICD-10-CM | POA: Diagnosis present

## 2018-12-11 DIAGNOSIS — Z7901 Long term (current) use of anticoagulants: Secondary | ICD-10-CM

## 2018-12-11 DIAGNOSIS — E871 Hypo-osmolality and hyponatremia: Secondary | ICD-10-CM | POA: Diagnosis present

## 2018-12-11 DIAGNOSIS — E039 Hypothyroidism, unspecified: Secondary | ICD-10-CM | POA: Diagnosis present

## 2018-12-11 DIAGNOSIS — R2231 Localized swelling, mass and lump, right upper limb: Secondary | ICD-10-CM | POA: Diagnosis present

## 2018-12-11 DIAGNOSIS — R0902 Hypoxemia: Secondary | ICD-10-CM

## 2018-12-11 DIAGNOSIS — E43 Unspecified severe protein-calorie malnutrition: Principal | ICD-10-CM | POA: Diagnosis present

## 2018-12-11 DIAGNOSIS — Z6829 Body mass index (BMI) 29.0-29.9, adult: Secondary | ICD-10-CM | POA: Diagnosis not present

## 2018-12-11 DIAGNOSIS — Z7401 Bed confinement status: Secondary | ICD-10-CM

## 2018-12-11 DIAGNOSIS — Z66 Do not resuscitate: Secondary | ICD-10-CM | POA: Diagnosis present

## 2018-12-11 DIAGNOSIS — R531 Weakness: Secondary | ICD-10-CM

## 2018-12-11 DIAGNOSIS — Z8546 Personal history of malignant neoplasm of prostate: Secondary | ICD-10-CM

## 2018-12-11 DIAGNOSIS — Z960 Presence of urogenital implants: Secondary | ICD-10-CM | POA: Diagnosis present

## 2018-12-11 DIAGNOSIS — L89310 Pressure ulcer of right buttock, unstageable: Secondary | ICD-10-CM | POA: Diagnosis present

## 2018-12-11 DIAGNOSIS — A0472 Enterocolitis due to Clostridium difficile, not specified as recurrent: Secondary | ICD-10-CM | POA: Diagnosis present

## 2018-12-11 DIAGNOSIS — Z880 Allergy status to penicillin: Secondary | ICD-10-CM

## 2018-12-11 DIAGNOSIS — Z515 Encounter for palliative care: Secondary | ICD-10-CM | POA: Diagnosis not present

## 2018-12-11 DIAGNOSIS — Z9079 Acquired absence of other genital organ(s): Secondary | ICD-10-CM | POA: Diagnosis not present

## 2018-12-11 DIAGNOSIS — R627 Adult failure to thrive: Secondary | ICD-10-CM | POA: Diagnosis present

## 2018-12-11 DIAGNOSIS — I482 Chronic atrial fibrillation, unspecified: Secondary | ICD-10-CM | POA: Diagnosis present

## 2018-12-11 DIAGNOSIS — Z882 Allergy status to sulfonamides status: Secondary | ICD-10-CM

## 2018-12-11 DIAGNOSIS — X58XXXD Exposure to other specified factors, subsequent encounter: Secondary | ICD-10-CM | POA: Diagnosis present

## 2018-12-11 DIAGNOSIS — T83090A Other mechanical complication of cystostomy catheter, initial encounter: Secondary | ICD-10-CM

## 2018-12-11 DIAGNOSIS — Z836 Family history of other diseases of the respiratory system: Secondary | ICD-10-CM

## 2018-12-11 DIAGNOSIS — Z823 Family history of stroke: Secondary | ICD-10-CM

## 2018-12-11 DIAGNOSIS — S72142D Displaced intertrochanteric fracture of left femur, subsequent encounter for closed fracture with routine healing: Secondary | ICD-10-CM

## 2018-12-11 DIAGNOSIS — Z79899 Other long term (current) drug therapy: Secondary | ICD-10-CM

## 2018-12-11 DIAGNOSIS — Z2239 Carrier of other specified bacterial diseases: Secondary | ICD-10-CM

## 2018-12-11 DIAGNOSIS — Z8673 Personal history of transient ischemic attack (TIA), and cerebral infarction without residual deficits: Secondary | ICD-10-CM

## 2018-12-11 DIAGNOSIS — H3530 Unspecified macular degeneration: Secondary | ICD-10-CM | POA: Diagnosis present

## 2018-12-11 DIAGNOSIS — H9190 Unspecified hearing loss, unspecified ear: Secondary | ICD-10-CM | POA: Diagnosis present

## 2018-12-11 DIAGNOSIS — Z87442 Personal history of urinary calculi: Secondary | ICD-10-CM

## 2018-12-11 DIAGNOSIS — Z800 Family history of malignant neoplasm of digestive organs: Secondary | ICD-10-CM

## 2018-12-11 DIAGNOSIS — Z8619 Personal history of other infectious and parasitic diseases: Secondary | ICD-10-CM

## 2018-12-11 DIAGNOSIS — Z888 Allergy status to other drugs, medicaments and biological substances status: Secondary | ICD-10-CM

## 2018-12-11 LAB — URINALYSIS, ROUTINE W REFLEX MICROSCOPIC
Bacteria, UA: NONE SEEN
Bilirubin Urine: NEGATIVE
Glucose, UA: NEGATIVE mg/dL
Ketones, ur: NEGATIVE mg/dL
Nitrite: NEGATIVE
Protein, ur: 30 mg/dL — AB
RBC / HPF: >50 RBC/hpf — ABNORMAL HIGH (ref 0–5)
Specific Gravity, Urine: 1.042 — ABNORMAL HIGH (ref 1.005–1.030)
Squamous Epithelial / HPF: NONE SEEN (ref 0–5)
WBC, UA: >50 WBC/hpf — ABNORMAL HIGH (ref 0–5)
pH: 7 (ref 5.0–8.0)

## 2018-12-11 LAB — CBC
HCT: 35.9 % — ABNORMAL LOW (ref 39.0–52.0)
Hemoglobin: 11.5 g/dL — ABNORMAL LOW (ref 13.0–17.0)
MCH: 26 pg (ref 26.0–34.0)
MCHC: 32 g/dL (ref 30.0–36.0)
MCV: 81 fL (ref 80.0–100.0)
Platelets: 453 10*3/uL — ABNORMAL HIGH (ref 150–400)
RBC: 4.43 MIL/uL (ref 4.22–5.81)
RDW: 19.2 % — ABNORMAL HIGH (ref 11.5–15.5)
WBC: 21.1 10*3/uL — ABNORMAL HIGH (ref 4.0–10.5)
nRBC: 0 % (ref 0.0–0.2)

## 2018-12-11 LAB — BASIC METABOLIC PANEL
Anion gap: 5 (ref 5–15)
BUN: 42 mg/dL — ABNORMAL HIGH (ref 8–23)
CO2: 22 mmol/L (ref 22–32)
Calcium: 7.5 mg/dL — ABNORMAL LOW (ref 8.9–10.3)
Chloride: 104 mmol/L (ref 98–111)
Creatinine, Ser: 1.23 mg/dL (ref 0.61–1.24)
GFR calc Af Amer: 59 mL/min — ABNORMAL LOW (ref >60)
GFR calc non Af Amer: 51 mL/min — ABNORMAL LOW (ref >60)
Glucose, Bld: 103 mg/dL — ABNORMAL HIGH (ref 70–99)
Potassium: 4.8 mmol/L (ref 3.5–5.1)
Sodium: 131 mmol/L — ABNORMAL LOW (ref 135–145)

## 2018-12-11 LAB — BRAIN NATRIURETIC PEPTIDE: B Natriuretic Peptide: 306 pg/mL — ABNORMAL HIGH (ref 0.0–100.0)

## 2018-12-11 LAB — HEPATIC FUNCTION PANEL
ALK PHOS: 72 U/L (ref 38–126)
ALT: 35 U/L (ref 0–44)
AST: 37 U/L (ref 15–41)
Albumin: 2.3 g/dL — ABNORMAL LOW (ref 3.5–5.0)
Bilirubin, Direct: 0.4 mg/dL — ABNORMAL HIGH (ref 0.0–0.2)
Indirect Bilirubin: 0.6 mg/dL (ref 0.3–0.9)
Total Bilirubin: 1 mg/dL (ref 0.3–1.2)
Total Protein: 4.5 g/dL — ABNORMAL LOW (ref 6.5–8.1)

## 2018-12-11 LAB — INFLUENZA PANEL BY PCR (TYPE A & B)
INFLBPCR: NEGATIVE
Influenza A By PCR: NEGATIVE

## 2018-12-11 LAB — LACTIC ACID, PLASMA: Lactic Acid, Venous: 1.3 mmol/L (ref 0.5–1.9)

## 2018-12-11 LAB — TROPONIN I: Troponin I: <0.03 ng/mL (ref <0.03)

## 2018-12-11 MED ORDER — VITAMIN C 500 MG PO TABS
500.0000 mg | ORAL_TABLET | Freq: Every day | ORAL | Status: DC
  Administered 2018-12-11 – 2018-12-13 (×3): 500 mg via ORAL
  Filled 2018-12-11 (×3): qty 1

## 2018-12-11 MED ORDER — ZINC SULFATE 220 (50 ZN) MG PO CAPS
220.0000 mg | ORAL_CAPSULE | Freq: Every day | ORAL | Status: DC
  Administered 2018-12-11 – 2018-12-13 (×3): 220 mg via ORAL
  Filled 2018-12-11 (×3): qty 1

## 2018-12-11 MED ORDER — SODIUM CHLORIDE 0.9 % IV BOLUS
1000.0000 mL | Freq: Once | INTRAVENOUS | Status: AC
  Administered 2018-12-11: 1000 mL via INTRAVENOUS

## 2018-12-11 MED ORDER — POLYETHYLENE GLYCOL 3350 17 G PO PACK: 17.0000 g | PACK | Freq: Every day | ORAL | Status: DC | PRN

## 2018-12-11 MED ORDER — RIVAROXABAN 10 MG PO TABS
10.0000 mg | ORAL_TABLET | Freq: Every day | ORAL | Status: DC
  Administered 2018-12-11 – 2018-12-12 (×2): 10 mg via ORAL
  Filled 2018-12-11 (×3): qty 1

## 2018-12-11 MED ORDER — ATORVASTATIN CALCIUM 20 MG PO TABS
20.0000 mg | ORAL_TABLET | Freq: Every day | ORAL | Status: DC
  Administered 2018-12-11 – 2018-12-12 (×2): 20 mg via ORAL
  Filled 2018-12-11 (×2): qty 1

## 2018-12-11 MED ORDER — ONDANSETRON HCL 4 MG PO TABS: 4.0000 mg | ORAL_TABLET | Freq: Four times a day (QID) | ORAL | Status: DC | PRN

## 2018-12-11 MED ORDER — ACETAMINOPHEN 325 MG PO TABS: 650.0000 mg | ORAL_TABLET | Freq: Four times a day (QID) | ORAL | Status: DC | PRN

## 2018-12-11 MED ORDER — VANCOMYCIN HCL 10 G IV SOLR
1250.0000 mg | Freq: Once | INTRAVENOUS | Status: AC
  Administered 2018-12-11: 1250 mg via INTRAVENOUS
  Filled 2018-12-11: qty 1250

## 2018-12-11 MED ORDER — POLYETHYLENE GLYCOL 3350 17 G PO PACK
17.0000 g | PACK | Freq: Every day | ORAL | Status: DC
  Filled 2018-12-11: qty 1

## 2018-12-11 MED ORDER — OCUVITE-LUTEIN PO CAPS
1.0000 | ORAL_CAPSULE | Freq: Every day | ORAL | Status: DC
  Administered 2018-12-11 – 2018-12-13 (×3): 1 via ORAL
  Filled 2018-12-11 (×3): qty 1

## 2018-12-11 MED ORDER — ENSURE ENLIVE PO LIQD
237.0000 mL | Freq: Three times a day (TID) | ORAL | Status: DC
  Administered 2018-12-11 – 2018-12-13 (×6): 237 mL via ORAL

## 2018-12-11 MED ORDER — VANCOMYCIN 50 MG/ML ORAL SOLUTION
125.0000 mg | Freq: Four times a day (QID) | ORAL | Status: DC
  Administered 2018-12-11 – 2018-12-13 (×9): 125 mg via ORAL
  Filled 2018-12-11 (×14): qty 2.5

## 2018-12-11 MED ORDER — DOCUSATE SODIUM 100 MG PO CAPS: 100.0000 mg | ORAL_CAPSULE | Freq: Two times a day (BID) | ORAL | Status: DC | PRN

## 2018-12-11 MED ORDER — VITAMIN B-12 1000 MCG PO TABS
5000.0000 ug | ORAL_TABLET | Freq: Every day | ORAL | Status: DC
  Administered 2018-12-11 – 2018-12-13 (×3): 5000 ug via ORAL
  Filled 2018-12-11 (×3): qty 5

## 2018-12-11 MED ORDER — ONDANSETRON HCL 4 MG/2ML IJ SOLN
4.0000 mg | Freq: Four times a day (QID) | INTRAMUSCULAR | Status: DC | PRN
  Administered 2018-12-12: 4 mg via INTRAVENOUS
  Filled 2018-12-11: qty 2

## 2018-12-11 MED ORDER — ADULT MULTIVITAMIN W/MINERALS CH
1.0000 | ORAL_TABLET | Freq: Every day | ORAL | Status: DC
  Administered 2018-12-11 – 2018-12-13 (×3): 1 via ORAL
  Filled 2018-12-11 (×3): qty 1

## 2018-12-11 MED ORDER — IOHEXOL 350 MG/ML SOLN
75.0000 mL | Freq: Once | INTRAVENOUS | Status: AC | PRN
  Administered 2018-12-11: 75 mL via INTRAVENOUS

## 2018-12-11 MED ORDER — SODIUM CHLORIDE 0.9 % IV SOLN
INTRAVENOUS | Status: DC
  Administered 2018-12-11 – 2018-12-12 (×2): via INTRAVENOUS

## 2018-12-11 MED ORDER — ACETAMINOPHEN 650 MG RE SUPP: 650.0000 mg | Freq: Four times a day (QID) | RECTAL | Status: DC | PRN

## 2018-12-11 MED ORDER — PIPERACILLIN-TAZOBACTAM 3.375 G IVPB 30 MIN
3.3750 g | Freq: Once | INTRAVENOUS | Status: AC
  Administered 2018-12-11: 3.375 g via INTRAVENOUS
  Filled 2018-12-11: qty 50

## 2018-12-11 MED ORDER — AMIODARONE HCL 200 MG PO TABS
100.0000 mg | ORAL_TABLET | Freq: Every day | ORAL | Status: DC
  Administered 2018-12-11 – 2018-12-13 (×3): 100 mg via ORAL
  Filled 2018-12-11 (×3): qty 1

## 2018-12-11 MED ORDER — LEVOTHYROXINE SODIUM 50 MCG PO TABS
75.0000 ug | ORAL_TABLET | Freq: Every day | ORAL | Status: DC
  Administered 2018-12-11 – 2018-12-13 (×3): 75 ug via ORAL
  Filled 2018-12-11 (×4): qty 2

## 2018-12-11 MED ORDER — OXYCODONE HCL 5 MG PO TABS
5.0000 mg | ORAL_TABLET | ORAL | Status: DC | PRN
  Administered 2018-12-12: 5 mg via ORAL
  Filled 2018-12-11: qty 1

## 2018-12-11 NOTE — ED Notes (Signed)
ED TO INPATIENT HANDOFF REPORT  ED Nurse Name and Phone #:  Anda Kraft [phone]  S Name/Age/Gender Matthew Hicks. 85 y.o. male Room/Bed: ED26A/ED26A  Code Status   Code Status: DNR  Home/SNF/Other Rehab Patient oriented to: self Is this baseline? Yes   Triage Complete: Triage complete  Chief Complaint r/o c-diff   Triage Note Pt arrives from [facility] via [facility] for c-diff, weakness, and poss clogged suprapubic cath. Pt arrives pale, c/o of burning from his penis. Hx of L femur fx. EMS reports pt oxygen anywhere from 80 to 95%. 92% on 4 L on arrival. Arms are edematous. Legs as well.    Allergies Allergies  Allergen Reactions  . Amoxicillin-Pot Clavulanate Swelling    Angioedema Has patient had a PCN reaction causing immediate rash, facial/tongue/throat swelling, SOB or lightheadedness with hypotension: Yes Has patient had a PCN reaction causing severe rash involving mucus membranes or skin necrosis: No Has patient had a PCN reaction that required hospitalization: No Has patient had a PCN reaction occurring within the last 10 years: Yes If all of the above answers are "NO", then may proceed with Cephalosporin use.   Marland Kitchen Fesoterodine Fumarate Er Hives and Rash  . Sulfa Antibiotics Rash    Level of Care/Admitting Diagnosis ED Disposition    ED Disposition Condition [facility] Area: [place] [[id number]]  Level of Care: Med-Surg [16]  Diagnosis: Weakness [[id number]]  Admitting Physician: Bettey Costa [[id number]]  Attending Physician: MODY, Ulice Bold [[id number]]  Estimated length of stay: 3 - 4 days  Certification:: I certify this patient will need inpatient services for at least 2 midnights  PT Class (Do Not Modify): Inpatient [101]  PT Acc Code (Do Not Modify): Private [1]       B Medical/Surgery History Past Medical History:  Diagnosis Date  . A-fib ([place]) 04/08/2014  . Aortic stenosis   . Cardiomyopathy ([place])   . Cataract   . CVA (cerebral  infarction) 2003  . Dysrhythmia   . GERD (gastroesophageal reflux disease)   . Hearing loss   . Hyperlipidemia   . Hypertension 05/04/2014  . Lung mass   . Macular degeneration   . Nephrolithiasis   . Pernicious anemia   . Pneumonia 05/2015  . Prostate cancer [facility]) 2007   performed by Dr. Eliberto Ivory  . Skin cancer   . Spinal stenosis   . Stroke [facility])    Past Surgical History:  Procedure Laterality Date  . CATARACT EXTRACTION    . CIRCUMCISION  1994  . CYSTOSCOPY  1946  . CYSTOSCOPY N/A 08/10/2018   Procedure: CYSTOSCOPY, superpubic catheter placement;  Surgeon: Royston Cowper, MD;  Location: [facility] ORS;  Service: Urology;  Laterality: N/A;  . CYSTOSCOPY W/ URETERAL STENT REMOVAL Left 06/15/2017   Procedure: CYSTOSCOPY WITH STENT REMOVAL, RETROGRADE, AND HOLMIUM LASER;  Surgeon: Royston Cowper, MD;  Location: [facility] ORS;  Service: Urology;  Laterality: Left;  . CYSTOSCOPY WITH STENT PLACEMENT Left 05/23/2017   Procedure: CYSTOSCOPY WITH STENT PLACEMENT;  Surgeon: Heron Sabins, MD;  Location: [facility] ORS;  Service: Urology;  Laterality: Left;  . INTRAMEDULLARY (IM) NAIL INTERTROCHANTERIC Left 11/18/2018   Procedure: INTRAMEDULLARY (IM) NAIL INTERTROCHANTRIC;  Surgeon: Dereck Leep, MD;  Location: [facility] ORS;  Service: Orthopedics;  Laterality: Left;  . laparoscopic prostectomy    . Spinal cyst removal    . TRANSURETHRAL RESECTION OF PROSTATE  2007   prostate cancer     A IV Location/Drains/Wounds Patient  Lines/Drains/Airways Status   Active Line/Drains/Airways    Name:   Placement date:   Placement time:   Site:   Days:   Peripheral IV 11/30/18 Right Antecubital   11/30/18    1909    Antecubital   11   Peripheral IV 12/11/18 Left Hand   12/11/18    0858    Hand   less than 1   Peripheral IV 12/11/18 Left Antecubital   12/11/18    0858    Antecubital   less than 1   Peripheral IV 12/11/18 Right Hand   12/11/18    0859    Hand   less than 1   Suprapubic Catheter Non-latex 18 Fr.    12/11/18    1448    Non-latex   less than 1   Incision (Closed) 06/15/17 Penis Other (Comment)   06/15/17    1620     544   Incision (Closed) 08/10/18 Abdomen   08/10/18    2339     123   Incision (Closed) 11/18/18 Hip Left   11/18/18    1813     23   Pressure Injury 12/01/18 Stage II -  Partial thickness loss of dermis presenting as a shallow open ulcer with a red, pink wound bed without slough.   12/01/18    1651     10          Intake/Output Last 24 hours No intake or output data in the 24 hours ending 12/11/18 1758  Labs/Imaging Results for orders placed or performed during the hospital encounter of 12/11/18 (from the past 48 hour(s))  Lactic acid, plasma     Status: None   Collection Time: 12/11/18  8:07 AM  Result Value Ref Range   Lactic Acid, Venous 1.3 0.5 - 1.9 mmol/L    Comment: Performed at [facility], [address] [place], [place] [address]  Basic metabolic panel     Status: Abnormal   Collection Time: 12/11/18  8:46 AM  Result Value Ref Range   Sodium 131 (L) 135 - 145 mmol/L   Potassium 4.8 3.5 - 5.1 mmol/L   Chloride 104 98 - 111 mmol/L   CO2 22 22 - 32 mmol/L   Glucose, Bld 103 (H) 70 - 99 mg/dL   BUN 42 (H) 8 - 23 mg/dL   Creatinine, Ser 1.23 0.61 - 1.24 mg/dL   Calcium 7.5 (L) 8.9 - 10.3 mg/dL   GFR calc non Af Amer 51 (L) >60 mL/min   GFR calc Af Amer 59 (L) >60 mL/min   Anion gap 5 5 - 15    Comment: Performed at [facility], [address] [place], [place] [address]  CBC     Status: Abnormal   Collection Time: 12/11/18  8:46 AM  Result Value Ref Range   WBC 21.1 (H) 4.0 - 10.5 K/uL   RBC 4.43 4.22 - 5.81 MIL/uL   Hemoglobin 11.5 (L) 13.0 - 17.0 g/dL   HCT 35.9 (L) 39.0 - 52.0 %   MCV 81.0 80.0 - 100.0 fL   MCH 26.0 26.0 - 34.0 pg   MCHC 32.0 30.0 - 36.0 g/dL   RDW 19.2 (H) 11.5 - 15.5 %   Platelets 453 (H) 150 - 400 K/uL   nRBC 0.0 0.0 - 0.2 %    Comment: Performed at [facility], [address]  [place], [place] [address]  Hepatic function panel     Status: Abnormal  Collection Time: 12/11/18  8:46 AM  Result Value Ref Range   Total Protein 4.5 (L) 6.5 - 8.1 g/dL   Albumin 2.3 (L) 3.5 - 5.0 g/dL   AST 37 15 - 41 U/L   ALT 35 0 - 44 U/L   Alkaline Phosphatase 72 38 - 126 U/L   Total Bilirubin 1.0 0.3 - 1.2 mg/dL   Bilirubin, Direct 0.4 (H) 0.0 - 0.2 mg/dL   Indirect Bilirubin 0.6 0.3 - 0.9 mg/dL    Comment: Performed at [facility], [address] [place], [place] [address]  Troponin I - ONCE - STAT     Status: None   Collection Time: 12/11/18  8:46 AM  Result Value Ref Range   Troponin I <0.03 <0.03 ng/mL    Comment: Performed at [facility], [address] [place], [place] [address]  Influenza panel by PCR (type A & B)     Status: None   Collection Time: 12/11/18  8:47 AM  Result Value Ref Range   Influenza A By PCR NEGATIVE NEGATIVE   Influenza B By PCR NEGATIVE NEGATIVE    Comment: (NOTE) The Xpert Xpress Flu assay is intended as an aid in the diagnosis of  influenza and should not be used as a sole basis for treatment.  This  assay is FDA approved for nasopharyngeal swab specimens only. Nasal  washings and aspirates are unacceptable for Xpert Xpress Flu testing. Performed at [facility], [address] [place], [place] [address]   Brain natriuretic peptide     Status: Abnormal   Collection Time: 12/11/18  8:47 AM  Result Value Ref Range   B Natriuretic Peptide 306.0 (H) 0.0 - 100.0 pg/mL    Comment: Performed at [facility], [address] [place], [place] [address]  Blood gas, venous ([facility], [facility], [facility])     Status: Abnormal (Preliminary result)   Collection Time: 12/11/18 11:15 AM  Result Value Ref Range   pH, Ven 7.38 7.250 - 7.430   pCO2, Ven 39 (L) 44.0 - 60.0 mmHg   pO2, Ven PENDING 32.0 - 45.0 mmHg   Bicarbonate 23.1 20.0 - 28.0 mmol/L   Acid-base deficit 1.8 0.0 - 2.0 mmol/L   O2 Saturation 41.1 %   Patient  temperature 37.0    Collection site VEIN    Sample type VEIN     Comment: Performed at [facility], [address] [place], [place] [address]  Urinalysis, Routine w reflex microscopic     Status: Abnormal   Collection Time: 12/11/18  2:39 PM  Result Value Ref Range   Color, Urine AMBER (A) YELLOW    Comment: BIOCHEMICALS MAY BE AFFECTED BY COLOR   APPearance CLOUDY (A) CLEAR   Specific Gravity, Urine 1.042 (H) 1.005 - 1.030   pH 7.0 5.0 - 8.0   Glucose, UA NEGATIVE NEGATIVE mg/dL   Hgb urine dipstick MODERATE (A) NEGATIVE   Bilirubin Urine NEGATIVE NEGATIVE   Ketones, ur NEGATIVE NEGATIVE mg/dL   Protein, ur 30 (A) NEGATIVE mg/dL   Nitrite NEGATIVE NEGATIVE   Leukocytes,Ua LARGE (A) NEGATIVE   RBC / HPF >50 (H) 0 - 5 RBC/hpf   WBC, UA >50 (H) 0 - 5 WBC/hpf   Bacteria, UA NONE SEEN NONE SEEN   Squamous Epithelial / LPF NONE SEEN 0 - 5   WBC Clumps PRESENT    Ca Oxalate Crys, UA PRESENT    Non Squamous Epithelial PRESENT (A) NONE SEEN  Comment: Performed at [facility], [place], [place] [address]   Ct Angio Chest Pe W And/or Wo Contrast  Result Date: 12/11/2018 CLINICAL DATA:  85 year old male with acute shortness of breath. History of lung cancer and interstitial lung disease. EXAM: CT ANGIOGRAPHY CHEST WITH CONTRAST TECHNIQUE: Multidetector CT imaging of the chest was performed using the standard protocol during bolus administration of intravenous contrast. Multiplanar CT image reconstructions and MIPs were obtained to evaluate the vascular anatomy. CONTRAST:  90mL OMNIPAQUE IOHEXOL 350 MG/ML SOLN COMPARISON:  12/11/2018 chest radiograph, 08/09/2017 chest CT and prior studies FINDINGS: Cardiovascular: This is a technically satisfactory study. No pulmonary emboli identified. Moderate to marked cardiomegaly again noted. Aortic valvular calcifications. Again noted without thoracic aortic aneurysm. Coronary artery and aortic atherosclerotic  calcifications are present. No pericardial effusion. Mediastinum/Nodes: No enlarged lymph nodes.  No mediastinal mass. Lungs/Pleura: RIGHT hemithorax volume loss again noted. Moderate round atelectasis in the mid and LOWER RIGHT lung again noted. A small loculated RIGHT pleural effusion is unchanged. A new moderate LEFT pleural effusion and LEFT LOWER lung atelectasis identified. Mild central bilateral ground-glass opacities are identified and may represent mild edema but nonspecific. No pneumothorax. No new pulmonary opacities are identified otherwise. Centrilobular and paraseptal emphysema identified. Upper Abdomen: Small amount of ascites in the UPPER abdomen noted. Diffuse subcutaneous edema noted. Musculoskeletal: No acute or suspicious bony abnormality. A T4 compression fracture is unchanged. Review of the MIP images confirms the above findings. IMPRESSION: 1. No evidence of pulmonary emboli. 2. New moderate LEFT pleural effusion and LEFT basilar atelectasis. 3. Mild central bilateral ground-glass opacities which may represent mild pulmonary edema. 4. Small amount of ascites and diffuse subcutaneous edema. 5. Unchanged small loculated RIGHT pleural effusion and RIGHT lung atelectasis. 6. Cardiomegaly, aortic valvular calcifications and coronary artery disease. 7.  Aortic Atherosclerosis (ICD10-I70.0). Electronically Signed   By: Margarette Canada M.D.   On: 12/11/2018 12:41   Dg Chest Port 1 View  Result Date: 12/11/2018 CLINICAL DATA:  Shortness of breath EXAM: PORTABLE CHEST 1 VIEW COMPARISON:  11/30/2018 FINDINGS: Cardiac shadow is mildly enlarged but stable. Thoracic aorta is tortuous with calcifications stable from the prior exam. Right-sided pleural effusion is noted with slight increased capping superiorly. Patchy opacity is noted stable from the prior exam again likely related to scarring. The left lung remains clear. IMPRESSION: Chronic changes on the right.  No acute abnormality noted. Electronically  Signed   By: Inez Catalina M.D.   On: 12/11/2018 09:48    Pending Labs Unresulted Labs (From admission, onward)    Start     Ordered   12/12/18 8657  Basic metabolic panel  Tomorrow morning,   STAT     12/11/18 1419   12/12/18 0500  CBC  Tomorrow morning,   STAT     12/11/18 1419   12/11/18 1115  Urine culture  ONCE - STAT,   STAT     12/11/18 1116   12/11/18 0846  Blood culture (routine x 2)  BLOOD CULTURE X 2,   STAT     12/11/18 0846          Vitals/Pain Today's Vitals   12/11/18 1604 12/11/18 1630 12/11/18 1700 12/11/18 1741  BP: (!) 107/52 106/62 (!) 108/58   Pulse:      Resp: 15 16    Temp:      TempSrc:      SpO2:      Weight:      Height:  PainSc:    0-No pain    Isolation Precautions No active isolations  Medications Medications  multivitamin-lutein (OCUVITE-LUTEIN) capsule 1 capsule (1 capsule Oral Given 12/11/18 1544)  vitamin B-12 (CYANOCOBALAMIN) tablet 5,000 mcg (5,000 mcg Oral Given 12/11/18 1544)  rivaroxaban (XARELTO) tablet 10 mg (10 mg Oral Given 12/11/18 1738)  atorvastatin (LIPITOR) tablet 20 mg (20 mg Oral Given 12/11/18 1738)  amiodarone (PACERONE) tablet 100 mg (100 mg Oral Given 12/11/18 1544)  levothyroxine (SYNTHROID, LEVOTHROID) tablet 75 mcg (75 mcg Oral Given 12/11/18 1542)  polyethylene glycol (MIRALAX / GLYCOLAX) packet 17 g (17 g Oral Refused 12/11/18 1605)  multivitamin with minerals tablet 1 tablet (1 tablet Oral Given 12/11/18 1543)  vitamin C (ASCORBIC ACID) tablet 500 mg (500 mg Oral Given 12/11/18 1543)  zinc sulfate capsule 220 mg (220 mg Oral Given 12/11/18 1542)  oxyCODONE (Oxy IR/ROXICODONE) immediate release tablet 5 mg (has no administration in time range)  docusate sodium (COLACE) capsule 100 mg (has no administration in time range)  feeding supplement (ENSURE ENLIVE) (ENSURE ENLIVE) liquid 237 mL (has no administration in time range)  vancomycin (VANCOCIN) 50 mg/mL oral solution 125 mg (125 mg Oral Given 12/11/18 1740)  0.9 %  sodium  chloride infusion (has no administration in time range)  acetaminophen (TYLENOL) tablet 650 mg (has no administration in time range)    Or  acetaminophen (TYLENOL) suppository 650 mg (has no administration in time range)  polyethylene glycol (MIRALAX / GLYCOLAX) packet 17 g (has no administration in time range)  ondansetron (ZOFRAN) tablet 4 mg (has no administration in time range)    Or  ondansetron (ZOFRAN) injection 4 mg (has no administration in time range)  vancomycin (VANCOCIN) 1,250 mg in sodium chloride 0.9 % 250 mL IVPB (0 mg Intravenous Stopped 12/11/18 1356)  piperacillin-tazobactam (ZOSYN) IVPB 3.375 g (0 g Intravenous Stopped 12/11/18 1447)  sodium chloride 0.9 % bolus 1,000 mL (0 mLs Intravenous Stopped 12/11/18 1357)  iohexol (OMNIPAQUE) 350 MG/ML injection 75 mL (75 mLs Intravenous Contrast Given 12/11/18 1151)    Mobility non-ambulatory High fall risk   Focused Assessments    R Recommendations: See Admitting Provider Note  Report given to:   Additional Notes:

## 2018-12-11 NOTE — Clinical Social Work Note (Addendum)
CSW met with the patient in ED at patient's bedside.  Mr. Matthew Hicks noted that "he is not going back to [facility]." Family dissatisfied with SNF. Requesting new list and also inquired about Hospice or Palliative Care.   CSW will consult with Attending.   Berenice Bouton, MSW, LCSW  [phone]

## 2018-12-11 NOTE — ED Notes (Signed)
Called dietary to get Ensure sent to ED

## 2018-12-11 NOTE — ED Notes (Signed)
Dr. Mariea Clonts changed out pt subrapubic catheter. Re-inserted an 18g blue cath with 30 cc in balloon. Pt tolerated procedure well. Urine returned into new catheter bag. Urine aspirated from tubing and sent to lab. Gauze and tegaderm placed over catheter insertion site.   Changed pt brief d/t being saturated from urine. Pt had brown BM. Able to turn side to side with assistance. Cleaned with wipes. Pink bottom pad placed over sacral ulcer.

## 2018-12-11 NOTE — Clinical Social Work Note (Signed)
CSW consulted with Attending after meeting with in ED at patient's bedside.  Discussed families wishes for patient not to return to [facility]. Family is asking for patient to be recommended for Palliative Care or Hospice Care.   Berenice Bouton, MSW, LCSW  [phone]

## 2018-12-11 NOTE — ED Notes (Signed)
Admitting MD at bedside.

## 2018-12-11 NOTE — H&P (Signed)
[facility] [place] NAME: Matthew Hicks    MR#:  [id number]  DATE OF BIRTH:  [birth year]  DATE OF ADMISSION:  12/11/2018  PRIMARY CARE PHYSICIAN: Kirk Ruths, MD   REQUESTING/REFERRING PHYSICIAN: DR Mariea Clonts  CHIEF COMPLAINT:   clogged suprapubic cath HISTORY OF PRESENT ILLNESS:  Matthew Hicks  is a 85 y.o. male with a known history of recent hospitalization with C. difficile colitis, chronic atrial fibrillation, Chronic systolic and diastolic heart failure ejection fraction 45% and recent left intertrochanteric fracture repair who was brought in from [facility] due to clogged suprapubic catheter, generalized weakness and increasing edema.  Family is at bedside.  Patient reports no shortness of breath, nausea or vomiting.  Patient is having 2-3 bowel movements a day.  Patient denies abdominal pain. Suprapubic catheter was noted to be clogged this morning by the staff at the [facility].  He does state he has some generalized weakness.  He is pretty much bedbound since being in the hospital from C. difficile colitis.  Patient was discharged on 26 February on vancomycin.  He was also discharged on 2 L of oxygen. During last hospitalization it was noted that he had Proteus in his urine however this was felt to be due to chronic colonization Today's CT scan shows a new left pleural effusion.  Patient is on Xarelto for chronic atrial fibrillation.   ER physician and nursing staff will change out suprapubic cath. Patient is not complaining of dysuria, frequency urgency.  PAST MEDICAL HISTORY:   Past Medical History:  Diagnosis Date  . A-fib ([place]) 04/08/2014  . Aortic stenosis   . Cardiomyopathy ([place])   . Cataract   . CVA (cerebral infarction) 2003  . Dysrhythmia   . GERD (gastroesophageal reflux disease)   . Hearing loss   . Hyperlipidemia   . Hypertension 05/04/2014  . Lung mass   . Macular degeneration   . Nephrolithiasis   .  Pernicious anemia   . Pneumonia 05/2015  . Prostate cancer [facility]) 2007   performed by Dr. Eliberto Ivory  . Skin cancer   . Spinal stenosis   . Stroke [facility])     PAST SURGICAL HISTORY:   Past Surgical History:  Procedure Laterality Date  . CATARACT EXTRACTION    . CIRCUMCISION  1994  . CYSTOSCOPY  1946  . CYSTOSCOPY N/A 08/10/2018   Procedure: CYSTOSCOPY, superpubic catheter placement;  Surgeon: Royston Cowper, MD;  Location: [facility] ORS;  Service: Urology;  Laterality: N/A;  . CYSTOSCOPY W/ URETERAL STENT REMOVAL Left 06/15/2017   Procedure: CYSTOSCOPY WITH STENT REMOVAL, RETROGRADE, AND HOLMIUM LASER;  Surgeon: Royston Cowper, MD;  Location: [facility] ORS;  Service: Urology;  Laterality: Left;  . CYSTOSCOPY WITH STENT PLACEMENT Left 05/23/2017   Procedure: CYSTOSCOPY WITH STENT PLACEMENT;  Surgeon: Heron Sabins, MD;  Location: [facility] ORS;  Service: Urology;  Laterality: Left;  . INTRAMEDULLARY (IM) NAIL INTERTROCHANTERIC Left 11/18/2018   Procedure: INTRAMEDULLARY (IM) NAIL INTERTROCHANTRIC;  Surgeon: Dereck Leep, MD;  Location: [facility] ORS;  Service: Orthopedics;  Laterality: Left;  . laparoscopic prostectomy    . Spinal cyst removal    . TRANSURETHRAL RESECTION OF PROSTATE  2007   prostate cancer    SOCIAL HISTORY:   Social History   Tobacco Use  . Smoking status: Former Smoker    Packs/day: 1.00    Years: 40.00    Pack years: 40.00    Types: Cigarettes    Last attempt  to quit: 06/08/1977    Years since quitting: 41.5  . Smokeless tobacco: Never Used  . Tobacco comment: does not smoke now  Substance Use Topics  . Alcohol use: No    Alcohol/week: 0.0 standard drinks    FAMILY HISTORY:   Family History  Problem Relation Age of Onset  . Stroke Father        heart disease  . Pneumonia Father   . Alzheimer's disease Brother   . Colon cancer Daughter 16  . Cancer - Other Paternal Aunt 48       "GYN cancer"  . Cancer - Other Paternal Grandmother 40       "GYN cancer"    DRUG  ALLERGIES:   Allergies  Allergen Reactions  . Amoxicillin-Pot Clavulanate Swelling    Angioedema Has patient had a PCN reaction causing immediate rash, facial/tongue/throat swelling, SOB or lightheadedness with hypotension: Yes Has patient had a PCN reaction causing severe rash involving mucus membranes or skin necrosis: No Has patient had a PCN reaction that required hospitalization: No Has patient had a PCN reaction occurring within the last 10 years: Yes If all of the above answers are "NO", then may proceed with Cephalosporin use.   Marland Kitchen Fesoterodine Fumarate Er Hives and Rash  . Sulfa Antibiotics Rash    REVIEW OF SYSTEMS:   Review of Systems  Constitutional: Positive for malaise/fatigue. Negative for chills and fever.  HENT: Positive for hearing loss. Negative for ear discharge, ear pain, nosebleeds and sore throat.   Eyes: Negative.  Negative for blurred vision and pain.  Respiratory: Negative.  Negative for cough, hemoptysis, shortness of breath and wheezing.   Cardiovascular: Positive for leg swelling. Negative for chest pain and palpitations.  Gastrointestinal: Positive for diarrhea (better). Negative for abdominal pain, blood in stool, nausea and vomiting.  Genitourinary: Negative.  Negative for dysuria.  Musculoskeletal: Negative.  Negative for back pain.       Right arm swelling  Skin: Negative.   Neurological: Negative for dizziness, tremors, speech change, focal weakness, seizures and headaches.  Endo/Heme/Allergies: Negative.  Does not bruise/bleed easily.  Psychiatric/Behavioral: Negative.  Negative for depression, hallucinations and suicidal ideas.    MEDICATIONS AT HOME:   Prior to Admission medications   Medication Sig Start Date End Date Taking? Authorizing Provider  acetaminophen (TYLENOL) 650 MG CR tablet Take 650 mg by mouth every 8 (eight) hours as needed for pain.   Yes [provider]  amiodarone (PACERONE) 100 MG tablet Take 100 mg by mouth  daily.   Yes [provider]  atorvastatin (LIPITOR) 20 MG tablet Take 20 mg by mouth daily.   Yes [provider]  azelastine (ASTELIN) 0.1 % nasal spray Place 2 sprays into the nose daily as needed for rhinitis or allergies.  11/14/15 12/11/18 Yes [provider]  Cyanocobalamin (B-12) 5000 MCG CAPS Take 5,000 mcg by mouth daily.    Yes [provider]  docusate sodium (COLACE) 100 MG capsule Take 1 capsule (100 mg total) by mouth 2 (two) times daily as needed for mild constipation. 12/07/18  Yes Gouru, Illene Silver, MD  feeding supplement, ENSURE ENLIVE, (ENSURE ENLIVE) LIQD Take 237 mLs by mouth 3 (three) times daily between meals. 12/07/18  Yes Gouru, Illene Silver, MD  levothyroxine (SYNTHROID, LEVOTHROID) 75 MCG tablet Take 75 mcg by mouth daily.  10/03/18 10/03/19 Yes [provider]  Multiple Vitamin (MULTIVITAMIN WITH MINERALS) TABS tablet Take 1 tablet by mouth daily.   Yes [provider]  Multiple Vitamins-Minerals (ICAPS) CAPS Take 1 tablet by mouth daily.   Yes [provider]  oxyCODONE (OXY IR/ROXICODONE) 5 MG immediate release tablet Take 1 tablet (5 mg total) by mouth every 4 (four) hours as needed for moderate pain (pain score 4-6). 12/07/18  Yes Gouru, Illene Silver, MD  Polyethylene Glycol 3350 (PEG 3350) POWD Take 17 g by mouth daily.   Yes [provider]  rivaroxaban (XARELTO) 10 MG TABS tablet TAKE 1 TABLET(10 MG TOTAL) BY MOUTH DAILY WITH DINNER. 08/26/15  Yes [provider]  vancomycin (VANCOCIN) 50 mg/mL oral solution Take 2.5 mLs (125 mg total) by mouth 4 (four) times daily for 7 days. 12/07/18 12/14/18 Yes Gouru, Illene Silver, MD  vitamin C (ASCORBIC ACID) 250 MG tablet Take 500 mg by mouth daily.   Yes [provider]  zinc sulfate 220 (50 Zn) MG capsule Take 220 mg by mouth daily.   Yes [provider]      VITAL SIGNS:  Blood pressure 98/70, pulse 76, temperature 97.7 F (36.5 C), temperature source  Oral, resp. rate 15, height 5\' 5"  (1.651 m), weight 60.9 kg, SpO2 100 %.  PHYSICAL EXAMINATION:   Physical Exam Constitutional:      General: He is not in acute distress.    Appearance: He is ill-appearing. He is not toxic-appearing.  HENT:     Head: Normocephalic.     Nose: No rhinorrhea.     Mouth/Throat:     Mouth: Mucous membranes are moist.     Pharynx: No oropharyngeal exudate.  Eyes:     General: No scleral icterus.    Extraocular Movements: Extraocular movements intact.  Neck:     Musculoskeletal: Normal range of motion and neck supple.     Vascular: No JVD.     Trachea: No tracheal deviation.  Cardiovascular:     Rate and Rhythm: Normal rate. Rhythm irregular.     Heart sounds: Normal heart sounds. No murmur. No friction rub. No gallop.   Pulmonary:     Effort: Pulmonary effort is normal. No respiratory distress.     Breath sounds: No wheezing or rales.     Comments: Decreased breath sounds right and left base Chest:     Chest wall: No tenderness.  Abdominal:     General: Bowel sounds are normal. There is no distension.     Palpations: Abdomen is soft. There is no mass.     Tenderness: There is no abdominal tenderness. There is no guarding or rebound.  Genitourinary:    Comments: Suprapubic catheter placed Musculoskeletal: Normal range of motion.        General: Swelling present.     Right lower leg: Edema present.     Left lower leg: Edema present.  Skin:    General: Skin is warm.     Findings: No erythema or rash.  Neurological:     Mental Status: He is alert and oriented to person, place, and time. Mental status is at baseline.  Psychiatric:        Judgment: Judgment normal.       LABORATORY PANEL:   CBC Recent Labs  Lab 12/11/18 0846  WBC 21.1*  HGB 11.5*  HCT 35.9*  PLT 453*   ------------------------------------------------------------------------------------------------------------------  Chemistries  Recent Labs  Lab 12/11/18 0846   NA 131*  K 4.8  CL 104  CO2 22  GLUCOSE 103*  BUN 42*  CREATININE 1.23  CALCIUM 7.5*  AST 37  ALT 35  ALKPHOS 72  BILITOT 1.0   ------------------------------------------------------------------------------------------------------------------  Cardiac Enzymes Recent Labs  Lab 12/11/18 0846  TROPONINI <0.03   ------------------------------------------------------------------------------------------------------------------  RADIOLOGY:  Ct Angio Chest Pe W And/or Wo Contrast  Result Date: 12/11/2018 CLINICAL DATA:  85 year old male with acute shortness of breath. History of lung cancer and interstitial lung disease. EXAM: CT ANGIOGRAPHY CHEST WITH CONTRAST TECHNIQUE: Multidetector CT imaging of the chest was performed using the standard protocol during bolus administration of intravenous contrast. Multiplanar CT image reconstructions and MIPs were obtained to evaluate the vascular anatomy. CONTRAST:  30mL OMNIPAQUE IOHEXOL 350 MG/ML SOLN COMPARISON:  12/11/2018 chest radiograph, 08/09/2017 chest CT and prior studies FINDINGS: Cardiovascular: This is a technically satisfactory study. No pulmonary emboli identified. Moderate to marked cardiomegaly again noted. Aortic valvular calcifications. Again noted without thoracic aortic aneurysm. Coronary artery and aortic atherosclerotic calcifications are present. No pericardial effusion. Mediastinum/Nodes: No enlarged lymph nodes.  No mediastinal mass. Lungs/Pleura: RIGHT hemithorax volume loss again noted. Moderate round atelectasis in the mid and LOWER RIGHT lung again noted. A small loculated RIGHT pleural effusion is unchanged. A new moderate LEFT pleural effusion and LEFT LOWER lung atelectasis identified. Mild central bilateral ground-glass opacities are identified and may represent mild edema but nonspecific. No pneumothorax. No new pulmonary opacities are identified otherwise. Centrilobular and paraseptal emphysema identified. Upper Abdomen:  Small amount of ascites in the UPPER abdomen noted. Diffuse subcutaneous edema noted. Musculoskeletal: No acute or suspicious bony abnormality. A T4 compression fracture is unchanged. Review of the MIP images confirms the above findings. IMPRESSION: 1. No evidence of pulmonary emboli. 2. New moderate LEFT pleural effusion and LEFT basilar atelectasis. 3. Mild central bilateral ground-glass opacities which may represent mild pulmonary edema. 4. Small amount of ascites and diffuse subcutaneous edema. 5. Unchanged small loculated RIGHT pleural effusion and RIGHT lung atelectasis. 6. Cardiomegaly, aortic valvular calcifications and coronary artery disease. 7.  Aortic Atherosclerosis (ICD10-I70.0). Electronically Signed   By: Margarette Canada M.D.   On: 12/11/2018 12:41   Dg Chest Port 1 View  Result Date: 12/11/2018 CLINICAL DATA:  Shortness of breath EXAM: PORTABLE CHEST 1 VIEW COMPARISON:  11/30/2018 FINDINGS: Cardiac shadow is mildly enlarged but stable. Thoracic aorta is tortuous with calcifications stable from the prior exam. Right-sided pleural effusion is noted with slight increased capping superiorly. Patchy opacity is noted stable from the prior exam again likely related to scarring. The left lung remains clear. IMPRESSION: Chronic changes on the right.  No acute abnormality noted. Electronically Signed   By: Inez Catalina M.D.   On: 12/11/2018 09:48    EKG:   Orders placed or performed during the hospital encounter of 12/11/18  . ED EKG  . ED EKG  . EKG 12-Lead  . EKG 12-Lead    IMPRESSION AND PLAN:   85 year old male recently discharged from the hospital with C. difficile colitis and acute kidney injury who presents from [facility] due to clogged suprapubic catheter, generalized weakness and edema.  1.  Anasarca: This is due to low albumin with poor nutritional status and being bedbound. I would be hesitant right now to start Lasix as patient has no other symptoms of CHF. I will recommend  wound care nurse to use compression for right upper arm swelling. Dietary consultation for nutritional status. His upper extremity Doppler during last hospitalization was negative for DVT. Would also recommend to elevate the right arm   2.  Suprapubic cath: This will be changed out by ER physician and nursing staff  3.  Leukocytosis:  It appears that patient's white blood cell count was 25 at discharge and remained stable. Patient was followed by Dr. Mike Gip for this. There does not seem to be an acute infection.  4.  C. difficile colitis during last hospitalization: Continue vancomycin to complete course  5.  Severe protein calorie malnutrition: Dietary consultation  6.  Chronic atrial fibrillation currently in atrial fibrillation: Continue Xarelto  7.  Chronic systolic and diastolic heart failure ejection fraction 45%:  8.  New left pleural effusion: Discussed this with the family.  At this time we will use incentive spirometer but no aggressive measures such as thoracentesis unless patient shows decline in respiratory status.  9.  Hypothyroidism: Continue Synthroid  10.  Hyponatremia: This is chronic in nature and stable  11.  Hyperlipidemia: Continue statin   CSW consult for d/c plan  All the records are reviewed and case discussed with ED provider. Management plans discussed with the patient and family and they are in agreement  CODE STATUS: DNR  TOTAL TIME TAKING CARE OF THIS PATIENT: 44 minutes.    Sitlaly Gudiel M.D on 12/11/2018 at 2:20 PM  Between 7am to 6pm - Pager - [phone]  After 6pm go to [email or web address] - password [email or web address] [facility]  Office  [phone]  CC: Primary care physician; Kirk Ruths, MD

## 2018-12-11 NOTE — ED Triage Notes (Signed)
Pt arrives from [facility] via [facility] for c-diff, weakness, and poss clogged suprapubic cath. Pt arrives pale, c/o of burning from his penis. Hx of L femur fx. EMS reports pt oxygen anywhere from 80 to 95%. 92% on 4 L on arrival. Arms are edematous. Legs as well.

## 2018-12-11 NOTE — Clinical Social Work Note (Signed)
CSW provided family with Medicare.gov Hospice Compare list at families request.  CSW placed a label copy in patient's file/chart.  Family is going to review Medicare.gov Hospice Compare list.  Once patient is admitted to medical unit family will discuss their choice with weekday CSW.    Berenice Bouton, MSW, LCSW  [phone]

## 2018-12-11 NOTE — ED Notes (Signed)
Patient transported to CT 

## 2018-12-11 NOTE — Clinical Social Work Note (Signed)
Clinical Social Work Assessment  Patient Details  Name: Matthew Hicks. MRN: [id number] Date of Birth: 1925/05/03  Date of referral:  12/11/18               Reason for consult:  Family Concerns, End of Life/Hospice, Facility Placement                Permission sought to share information with:  Case Manager, Family Supports, Customer service manager Permission granted to share information::  Yes, Verbal Permission Granted  Name::     Matthew, Hicks, son, [phone]; Matthew, Hicks, spouse, [phone]  [facility]::  [facility] or [facility]  Relationship::  Matthew, Hicks, son, [phone]; Matthew, Hicks, spouse, [phone]  Contact Information:  Matthew, Hicks, son, [phone]; Matthew, Hicks, spouse, [phone]  Housing/Transportation Living arrangements for the past 2 months:  [place], Single Family Home(Patient at [employer] for about 21 days) Source of Information:  Adult Engineer, manufacturing, son) Patient Interpreter Needed:  None Criminal Activity/Legal Involvement Pertinent to Current Situation/Hospitalization:  No - Comment as needed Significant Relationships:  Adult Children(son) Lives with:  Facility Resident(SNF) Do you feel safe going back to the place where you live?  Yes Need for family participation in patient care:  Yes (Comment)  Care giving concerns:  Mr. Matthew Hicks is requesting assistance with placement at [facility] or Palliative care.  Family does not want pt to return to [facility] via EMS with several medical complications (see ED provider notes).     Social Worker assessment / plan: CSW met with the patient and his family in ED at pt's bedside.  The patient is a 85 y.o., Caucasian male who presented to the ED on 12/11/2018 from [facility]. Family requesting assistance, once patient is medically cleared for discharge, with Hospice or Palliative care.    Plan: Clinician provided family with Medicare.gove Hospice Compare preferred list. Family will  review list.  Family will discuss their choice of Hospice or Palliative care facility with weekday CSW.   Employment status:  Retired Forensic scientist:  Medicare PT Recommendations:  Not assessed at this time Information / Referral to community resources:     Patient/Family's Response to care:  Family in agreement for pt to be discharge to Hospice or Palliative care.   Patient/Family's Understanding of and Emotional Response to Diagnosis, Current Treatment, and Prognosis: Family demonstrated good understanding of plan.   Emotional Assessment Appearance:  Appears older than stated age Attitude/Demeanor/Rapport:  Other(calm, non verbal) Affect (typically observed):  Calm Orientation:  Oriented to Self Alcohol / Substance use:  Not Applicable Psych involvement (Current and /or in the community):  No (Comment)  Discharge Needs  Concerns to be addressed:  Discharge Planning Concerns, Decision making concerns(Family is deciding on Hospice Care or Palliative Care) Readmission within the last 30 days:  Yes Current discharge risk:  None Barriers to Discharge:  Continued Medical Work up   [employer], LCSW 12/11/2018, 4:58 PM

## 2018-12-11 NOTE — Progress Notes (Signed)
Family Meeting Note  Advance Directive:yes  Today a meeting took place with the Patient.and family  The following clinical team members were present during this meeting:MD  The following were discussed:Patient's diagnosis:[place]aca protiein calorie malnutrition CHF chronic systolic and diastolic  , Patient's progosis: < 12 months and Goals for treatment: DNR  Additional follow-up to be provided: palliative outpatient consult /hospice outpatient  Time spent during discussion:16 minutes  Matthew Hicks, Ulice Bold, MD

## 2018-12-11 NOTE — ED Provider Notes (Addendum)
[facility] Emergency Department Provider Note  ____________________________________________  Time seen: Approximately 11:17 AM  I have reviewed the triage vital signs and the nursing notes.   HISTORY  Chief Complaint Weakness    HPI Matthew Hicks. is a 85 y.o. male L intertrochanteric fx s/p repair2/5/20, afib on Xarelto, s/p discharge 12/07/18 for sepsis w/ C diff colitis presenting w/ suprapubic pain, penile burning, general malaise.  Overall, the patient has been having a clinically worsening course.  His sons accompany him.  They report that he has been having general malaise, that his bilateral lower extremity and right upper arm edema have returned.  During his has last ED stay on 2/19, I did ultrasound both his legs which were negative for clot.  He has not been having any nausea or vomiting, and his diarrhea has decreased from 10+ stools daily to 2-4 loose stools, occasionally with formed stool on oral antibiotics so this treatment seems to be working.  The patient's largest concern is that he has had decreased output from his suprapubic catheter, with extravasation of urine around the catheter site, significant pelvic pressure sensation and a burning sensation in the penis.  He has not had any fevers or chills at home.   Past Medical History:  Diagnosis Date  . A-fib ([place]) 04/08/2014  . Aortic stenosis   . Cardiomyopathy ([place])   . Cataract   . CVA (cerebral infarction) 2003  . Dysrhythmia   . GERD (gastroesophageal reflux disease)   . Hearing loss   . Hyperlipidemia   . Hypertension 05/04/2014  . Lung mass   . Macular degeneration   . Nephrolithiasis   . Pernicious anemia   . Pneumonia 05/2015  . Prostate cancer [facility]) 2007   performed by Dr. Eliberto Ivory  . Skin cancer   . Spinal stenosis   . Stroke [facility])     Patient Active Problem List   Diagnosis Date Noted  . Protein-calorie malnutrition, severe 12/06/2018  . Pressure injury of skin 12/01/2018   . Acute colitis 11/30/2018  . Closed left hip fracture, initial encounter ([place]) 11/16/2018  . Adrenal nodule ([place]) 07/15/2017  . Kidney stone on left side 05/23/2017  . Benign paroxysmal positional vertigo due to bilateral vestibular disorder 01/07/2017  . Pure hypercholesterolemia 05/13/2016  . History of stroke 11/14/2015  . Cerebral vascular accident ([place]) 07/02/2014  . BP (high blood pressure) 05/04/2014  . A-fib ([place]) 04/08/2014  . Benign hypertension 04/08/2014  . Aortic heart valve narrowing 04/08/2014  . Cardiomyopathy ([place]) 04/08/2014  . Acid reflux 04/08/2014  . HLD (hyperlipidemia) 04/08/2014  . PA (pernicious anemia) 04/08/2014  . Spinal stenosis 04/08/2014    Past Surgical History:  Procedure Laterality Date  . CATARACT EXTRACTION    . CIRCUMCISION  1994  . CYSTOSCOPY  1946  . CYSTOSCOPY N/A 08/10/2018   Procedure: CYSTOSCOPY, superpubic catheter placement;  Surgeon: Royston Cowper, MD;  Location: [facility] ORS;  Service: Urology;  Laterality: N/A;  . CYSTOSCOPY W/ URETERAL STENT REMOVAL Left 06/15/2017   Procedure: CYSTOSCOPY WITH STENT REMOVAL, RETROGRADE, AND HOLMIUM LASER;  Surgeon: Royston Cowper, MD;  Location: [facility] ORS;  Service: Urology;  Laterality: Left;  . CYSTOSCOPY WITH STENT PLACEMENT Left 05/23/2017   Procedure: CYSTOSCOPY WITH STENT PLACEMENT;  Surgeon: Heron Sabins, MD;  Location: [facility] ORS;  Service: Urology;  Laterality: Left;  . INTRAMEDULLARY (IM) NAIL INTERTROCHANTERIC Left 11/18/2018   Procedure: INTRAMEDULLARY (IM) NAIL INTERTROCHANTRIC;  Surgeon: Dereck Leep, MD;  Location: [facility]  ORS;  Service: Orthopedics;  Laterality: Left;  . laparoscopic prostectomy    . Spinal cyst removal    . TRANSURETHRAL RESECTION OF PROSTATE  2007   prostate cancer    Current Outpatient Rx  . Order #: [id number] Class: Historical Med  . Order #: [id number] Class: Historical Med  . Order #: [id number] Class: Historical Med  . Order #: [id number] Class: Historical Med   . Order #: [id number] Class: Historical Med  . Order #: [id number] Class: No Print  . Order #: [id number] Class: Normal  . Order #: [id number] Class: Historical Med  . Order #: [id number] Class: Historical Med  . Order #: [id number] Class: Historical Med  . Order #: [id number] Class: Print  . Order #: [id number] Class: Historical Med  . Order #: [id number] Class: Historical Med  . Order #: [id number] Class: Print  . Order #: [id number] Class: Historical Med  . Order #: [id number] Class: Historical Med    Allergies Amoxicillin-pot clavulanate; Fesoterodine fumarate er; and Sulfa antibiotics  Family History  Problem Relation Age of Onset  . Stroke Father        heart disease  . Pneumonia Father   . Alzheimer's disease Brother   . Colon cancer Daughter 69  . Cancer - Other Paternal Aunt 11       "GYN cancer"  . Cancer - Other Paternal Grandmother 89       "GYN cancer"    Social History Social History   Tobacco Use  . Smoking status: Former Smoker    Packs/day: 1.00    Years: 40.00    Pack years: 40.00    Types: Cigarettes    Last attempt to quit: 06/08/1977    Years since quitting: 41.5  . Smokeless tobacco: Never Used  . Tobacco comment: does not smoke now  Substance Use Topics  . Alcohol use: No    Alcohol/week: 0.0 standard drinks  . Drug use: No    Review of Systems Constitutional: No fevers or chills.  Positive general malaise.  Decreased energy.  No changes in mental status. Eyes: No visual changes.  No eye discharge. ENT: No sore throat. No congestion or rhinorrhea.  No ear pain. Cardiovascular: Denies chest pain. Denies palpitations. Respiratory: Positive new oxygen requirement over the last several weeks with associated shortness of breath.  No cough. Gastrointestinal: Positive suprapubic abdominal pain.  No nausea, no vomiting.  Improving diarrhea, now only 2-4 episodes daily with occasional formed stools.  No constipation. Genitourinary: Positive for thick yellow-brown  discharge from his suprapubic catheter with extravasation of urine outside of the catheter, in addition to suprapubic fullness and pain.  The patient also reports penile burning. Musculoskeletal: Negative for back pain. Skin: Positive for decubitus ulcer with pain. Neurological: Negative for headaches. No focal numbness, tingling or weakness.     ____________________________________________   PHYSICAL EXAM:  VITAL SIGNS: ED Triage Vitals [12/11/18 0834]  Enc Vitals Group     BP (!) 155/89     Pulse Rate (!) 105     Resp (!) 21     Temp 97.7 F (36.5 C)     Temp Source Oral     SpO2 97 %     Weight 134 lb 4.2 oz (60.9 kg)     Height 5\' 5"  (1.651 m)     Head Circumference      Peak Flow      Pain Score 0     Pain Loc      Pain Edu?      Excl. in [place]?     Constitutional:  The patient is alert and able to answer basic questions.  GCS is 15.  He does appear more sick than when I saw him on 2/19. Eyes: Conjunctivae are normal.  EOMI. No scleral icterus. Head: Atraumatic. Nose: No congestion/rhinnorhea. Mouth/Throat: Mucous membranes are dry.  Neck: No stridor.  Supple.  No JVD.  No meningismus. Cardiovascular: Normal rate, regular rhythm. No murmurs, rubs or gallops.  Respiratory: Normal respiratory effort.  No accessory muscle use or retractions. Lungs CTAB.  No wheezes, rales or ronchi. Gastrointestinal: Soft.  Patient has a supra pubic catheter that has thick discharge coming from it with no significant urine output.  He has distention of the bladder to the umbilicus with associated discomfort.  He has some extravasation of urine around the catheter when I push on his suprapubic area.  No guarding or rebound.  No peritoneal signs. Musculoskeletal: Bilateral lower extremity edema that is symmetric and pitting to the mid tibial shaft.  The patient's right hand and distal forearm also have edema. Neurologic: Alert.  Speech is clear.  Face and smile are symmetric.  EOMI.  Moves all  extremities well. Skin:  Skin is warm, dry.  Report of a decubitus ulcer that I was unable to visualize myself; I have asked the nurse to turn him for further evaluation. Psychiatric: Mood and affect are normal.   ____________________________________________   LABS (all labs ordered are listed, but only abnormal results are displayed)  Labs Reviewed  BASIC METABOLIC PANEL - Abnormal; Notable for the following components:      Result Value   Sodium 131 (*)    Glucose, Bld 103 (*)    BUN 42 (*)    Calcium 7.5 (*)    GFR calc non Af Amer 51 (*)    GFR calc Af Amer 59 (*)    All other components within normal limits  CBC - Abnormal; Notable for the following components:   WBC 21.1 (*)    Hemoglobin 11.5 (*)    HCT 35.9 (*)    RDW 19.2 (*)    Platelets 453 (*)    All other components within normal limits  HEPATIC FUNCTION PANEL - Abnormal; Notable for the following components:   Total Protein 4.5 (*)    Albumin 2.3 (*)    Bilirubin, Direct 0.4 (*)    All other components within normal limits  BLOOD GAS, VENOUS - Abnormal; Notable for the following components:   pCO2, Ven 39 (*)    All other components within normal limits  BRAIN NATRIURETIC PEPTIDE - Abnormal; Notable for the following components:   B Natriuretic Peptide 306.0 (*)    All other components within normal limits  CULTURE, BLOOD (ROUTINE X 2)  CULTURE, BLOOD (ROUTINE X 2)  URINE CULTURE  INFLUENZA PANEL BY PCR (TYPE A & B)  LACTIC ACID, PLASMA  TROPONIN I  LACTIC ACID, PLASMA  URINALYSIS, ROUTINE W REFLEX MICROSCOPIC  CBG MONITORING, ED   ____________________________________________  EKG  ED ECG REPORT I, Anne-Caroline Mariea Clonts, the attending physician, personally viewed and interpreted this ECG.   Date: 12/11/2018  EKG Time: 832  Rate: 85  Rhythm: poor baseline tracing; afib w/ frequent PVCs  Axis: normal  Intervals:prolonged QTc  ST&T Change: No  STEMI  ____________________________________________  RADIOLOGY  Ct Angio Chest Pe W And/or Wo Contrast  Result Date: 12/11/2018 CLINICAL DATA:  85 year old male with acute shortness of breath. History of lung cancer and interstitial lung disease. EXAM: CT ANGIOGRAPHY CHEST WITH CONTRAST TECHNIQUE: Multidetector  CT imaging of the chest was performed using the standard protocol during bolus administration of intravenous contrast. Multiplanar CT image reconstructions and MIPs were obtained to evaluate the vascular anatomy. CONTRAST:  46mL OMNIPAQUE IOHEXOL 350 MG/ML SOLN COMPARISON:  12/11/2018 chest radiograph, 08/09/2017 chest CT and prior studies FINDINGS: Cardiovascular: This is a technically satisfactory study. No pulmonary emboli identified. Moderate to marked cardiomegaly again noted. Aortic valvular calcifications. Again noted without thoracic aortic aneurysm. Coronary artery and aortic atherosclerotic calcifications are present. No pericardial effusion. Mediastinum/Nodes: No enlarged lymph nodes.  No mediastinal mass. Lungs/Pleura: RIGHT hemithorax volume loss again noted. Moderate round atelectasis in the mid and LOWER RIGHT lung again noted. A small loculated RIGHT pleural effusion is unchanged. A new moderate LEFT pleural effusion and LEFT LOWER lung atelectasis identified. Mild central bilateral ground-glass opacities are identified and may represent mild edema but nonspecific. No pneumothorax. No new pulmonary opacities are identified otherwise. Centrilobular and paraseptal emphysema identified. Upper Abdomen: Small amount of ascites in the UPPER abdomen noted. Diffuse subcutaneous edema noted. Musculoskeletal: No acute or suspicious bony abnormality. A T4 compression fracture is unchanged. Review of the MIP images confirms the above findings. IMPRESSION: 1. No evidence of pulmonary emboli. 2. New moderate LEFT pleural effusion and LEFT basilar atelectasis. 3. Mild central bilateral ground-glass  opacities which may represent mild pulmonary edema. 4. Small amount of ascites and diffuse subcutaneous edema. 5. Unchanged small loculated RIGHT pleural effusion and RIGHT lung atelectasis. 6. Cardiomegaly, aortic valvular calcifications and coronary artery disease. 7.  Aortic Atherosclerosis (ICD10-I70.0). Electronically Signed   By: Margarette Canada M.D.   On: 12/11/2018 12:41   Dg Chest Port 1 View  Result Date: 12/11/2018 CLINICAL DATA:  Shortness of breath EXAM: PORTABLE CHEST 1 VIEW COMPARISON:  11/30/2018 FINDINGS: Cardiac shadow is mildly enlarged but stable. Thoracic aorta is tortuous with calcifications stable from the prior exam. Right-sided pleural effusion is noted with slight increased capping superiorly. Patchy opacity is noted stable from the prior exam again likely related to scarring. The left lung remains clear. IMPRESSION: Chronic changes on the right.  No acute abnormality noted. Electronically Signed   By: Inez Catalina M.D.   On: 12/11/2018 09:48    ____________________________________________   PROCEDURES  Procedure(s) performed: None  ASPIRATION BLADDER INSERT SUPRAPUBIC CATHETER Date/Time: 12/11/2018 2:20 PM Performed by: Eula Listen, MD Authorized by: Eula Listen, MD  Consent: Verbal consent obtained. Consent given by: patient Patient understanding: patient states understanding of the procedure being performed Patient identity confirmed: verbally with patient and arm band Time out: Immediately prior to procedure a "time out" was called to verify the correct patient, procedure, equipment, support staff and site/side marked as required. Local anesthesia used: no  Anesthesia: Local anesthesia used: no  Sedation: Patient sedated: no  Patient tolerance: Patient tolerated the procedure well with no immediate complications Comments: Patient suprapubic catheter was removed after removal of saline from the balloon tip without any difficulty.  He had some  extravasation of urine and a very small amount of blood-tinge after removal.  A new 18 French catheter was placed without any complications; dark cloudy urine returned.     Critical Care performed: Yes, see critical care note(s) ____________________________________________   INITIAL IMPRESSION / ASSESSMENT AND PLAN / ED COURSE  Pertinent labs & imaging results that were available during my care of the patient were reviewed by me and considered in my medical decision making (see chart for details).  85 y.o. male with left intertrochanteric fracture status post  repair, and recent hospitalization for C. difficile, presenting with pain and distention around his suprapubic catheter with extravasation of urine around the catheter, as well as penile burning.   The patient has multiple issues today.  The patient suprapubic catheter appears to be blocked or displaced.  I have asked the nurses to find a new suprapubic catheter so that I might change it.  In addition, I am concerned about the thick discharge in the catheter itself, concerning for UTI.  The patient does not have an elevated white blood cell count of 21, which is stable and not worse for him.  His lactic acid is normal.  Empiric antibiotics have been ordered.  Other potential source for infection is the patient's lungs.  His chest x-ray does not show any acute findings.  I did do a CT, which shows a new left pleural effusion.  It is unclear whether this is infectious, or may be related to some congestive heart failure; given his peripheral edema, there may be a cardiac component to this.  The patient does not have a PE.  His oxygen use is new for him since his last hospitalization, and he is able to maintain O2 sats on this oxygen; his blood gas shows a normal pH and reassuring oxygenation.  The patient has received empiric antibiotics.  ----------------------------------------- 1:43 PM on  12/11/2018 -----------------------------------------  Patient's influenza testing is negative.  His hyponatremia stable.  His BNP is stable as well; CHF could be contributing to the patient's edema, pleural effusion, and shortness of breath with hypoxia, but he does not appear to be acutely exacerbated.  Left pleural effusion is new however.  Also, the patient was discharged on 2 L nasal cannula and has now been on 3 to 4 L in the emergency department, again likely due his to his new pleural effusion.  He has an increased oxygen requirement.  I have reevaluated the patient, who continues to have general malaise.  He has received his antibiotics.  I have spoken to the patient and his family about admission.  We are waiting a catheter to be able to attempt to replace his suprapubic catheter.   CRITICAL CARE Performed by: Eula Listen   Total critical care time: 35 minutes  Critical care time was exclusive of separately billable procedures and treating other patients.  Critical care was necessary to treat or prevent imminent or life-threatening deterioration.  Critical care was time spent personally by me on the following activities: development of treatment plan with patient and/or surrogate as well as nursing, discussions with consultants, evaluation of patient's response to treatment, examination of patient, obtaining history from patient or surrogate, ordering and performing treatments and interventions, ordering and review of laboratory studies, ordering and review of radiographic studies, pulse oximetry and re-evaluation of patient's condition.    ____________________________________________  FINAL CLINICAL IMPRESSION(S) / ED DIAGNOSES  Final diagnoses:  Mechanical complication of suprapubic catheter, initial encounter ([place])  Pleural effusion on left  Edema, unspecified type  Hypoxia         NEW MEDICATIONS STARTED DURING THIS VISIT:  New Prescriptions   No  medications on file      Eula Listen, MD 12/11/18 1345    Eula Listen, MD 12/11/18 1346    Eula Listen, MD 12/11/18 1421

## 2018-12-12 ENCOUNTER — Other Ambulatory Visit: Payer: Self-pay

## 2018-12-12 LAB — BASIC METABOLIC PANEL
Anion gap: 6 (ref 5–15)
BUN: 37 mg/dL — ABNORMAL HIGH (ref 8–23)
CO2: 20 mmol/L — ABNORMAL LOW (ref 22–32)
Calcium: 7.2 mg/dL — ABNORMAL LOW (ref 8.9–10.3)
Chloride: 108 mmol/L (ref 98–111)
Creatinine, Ser: 1.17 mg/dL (ref 0.61–1.24)
GFR calc Af Amer: >60 mL/min (ref >60)
GFR, EST NON AFRICAN AMERICAN: 54 mL/min — AB (ref >60)
Glucose, Bld: 90 mg/dL (ref 70–99)
POTASSIUM: 4.5 mmol/L (ref 3.5–5.1)
Sodium: 134 mmol/L — ABNORMAL LOW (ref 135–145)

## 2018-12-12 LAB — CBC
HCT: 31 % — ABNORMAL LOW (ref 39.0–52.0)
Hemoglobin: 9.7 g/dL — ABNORMAL LOW (ref 13.0–17.0)
MCH: 25.6 pg — ABNORMAL LOW (ref 26.0–34.0)
MCHC: 31.3 g/dL (ref 30.0–36.0)
MCV: 81.8 fL (ref 80.0–100.0)
Platelets: 420 10*3/uL — ABNORMAL HIGH (ref 150–400)
RBC: 3.79 MIL/uL — ABNORMAL LOW (ref 4.22–5.81)
RDW: 19.4 % — ABNORMAL HIGH (ref 11.5–15.5)
WBC: 18.8 10*3/uL — ABNORMAL HIGH (ref 4.0–10.5)
nRBC: 0 % (ref 0.0–0.2)

## 2018-12-12 LAB — MRSA PCR SCREENING: MRSA by PCR: NEGATIVE

## 2018-12-12 MED ORDER — COLLAGENASE 250 UNIT/GM EX OINT
TOPICAL_OINTMENT | Freq: Every day | CUTANEOUS | Status: DC
  Administered 2018-12-12 – 2018-12-13 (×2): via TOPICAL
  Filled 2018-12-12: qty 30

## 2018-12-12 NOTE — Progress Notes (Signed)
Family Meeting Note  Advance Directive:yes  Today a meeting took place with the 2 sons and 1 daughter.  Patient is unable to participate due RP:ZPSUGA capacity multiple medical issues and old age.   The following clinical team members were present during this meeting:MD  The following were discussed:Patient's diagnosis: failure to thrive, low albumin, anasarca, c diff , Patient's progosis: < 6 weeks and Goals for treatment: DNR  Additional follow-up to be provided: plliative care and hospice.  Time spent during discussion:20 minutes  Matthew Basta, MD

## 2018-12-12 NOTE — Progress Notes (Signed)
[facility] NAME: Matthew Hicks    MR#:  [id number]  DATE OF BIRTH:  [birth year]  SUBJECTIVE:  CHIEF COMPLAINT:   Chief Complaint  Patient presents with  . Weakness  He had hip fracture a month ago and sent to a nursing home where he was recovering but then he had a C. difficile infection and admitted last week. After he was sent to the rehab last week he remained mostly bedbound and sent back to the hospital because of blocked urinary catheter. Patient was without any complaints when I saw, his catheter was changed by ER.  He had significant swelling and decreased oral intake for last few weeks.  His 2 sons and 1 daughter were present in the room during my visit.  His diarrhea was slightly better  REVIEW OF SYSTEMS:  CONSTITUTIONAL: No fever, have fatigue or weakness.  EYES: No blurred or double vision.  EARS, NOSE, AND THROAT: No tinnitus or ear pain.  RESPIRATORY: No cough, shortness of breath, wheezing or hemoptysis.  CARDIOVASCULAR: No chest pain, orthopnea, edema.  GASTROINTESTINAL: No nausea, vomiting, have diarrhea or abdominal pain.  GENITOURINARY: No dysuria, hematuria.  ENDOCRINE: No polyuria, nocturia,  HEMATOLOGY: No anemia, easy bruising or bleeding SKIN: No rash or lesion. MUSCULOSKELETAL: No joint pain or arthritis.   NEUROLOGIC: No tingling, numbness, weakness.  PSYCHIATRY: No anxiety or depression.   ROS  DRUG ALLERGIES:   Allergies  Allergen Reactions  . Amoxicillin-Pot Clavulanate Swelling    Angioedema Has patient had a PCN reaction causing immediate rash, facial/tongue/throat swelling, SOB or lightheadedness with hypotension: Yes Has patient had a PCN reaction causing severe rash involving mucus membranes or skin necrosis: No Has patient had a PCN reaction that required hospitalization: No Has patient had a PCN reaction occurring within the last 10 years: Yes If all of the above answers are "NO", then may  proceed with Cephalosporin use.   Marland Kitchen Fesoterodine Fumarate Er Hives and Rash  . Sulfa Antibiotics Rash    VITALS:  Blood pressure (!) 118/42, pulse 64, temperature 97.6 F (36.4 C), temperature source Oral, resp. rate 20, height 5\' 4"  (1.626 m), weight 76.7 kg, SpO2 100 %.  PHYSICAL EXAMINATION:  GENERAL:  85 y.o.-year-old patient, malnourished lying in the bed with no acute distress.  EYES: Pupils equal, round, reactive to light and accommodation. No scleral icterus. Extraocular muscles intact.  HEENT: Head atraumatic, normocephalic. Oropharynx and nasopharynx clear.  NECK:  Supple, no jugular venous distention. No thyroid enlargement, no tenderness.  LUNGS: Normal breath sounds bilaterally, no wheezing, some crepitation. No use of accessory muscles of respiration.  CARDIOVASCULAR: S1, S2 normal. No murmurs, rubs, or gallops.  ABDOMEN: Soft, nontender, nondistended. Bowel sounds present. No organomegaly or mass.  EXTREMITIES: Diffuse bilateral pedal edema also extending on both upper extremities, no cyanosis, or clubbing.  NEUROLOGIC: Cranial nerves II through XII are intact. Muscle strength 3/5 in all extremities. Sensation intact. Gait not checked.  PSYCHIATRIC: The patient is alert and oriented x 2.  SKIN: No obvious rash, lesion, or ulcer.   Physical Exam LABORATORY PANEL:   CBC Recent Labs  Lab 12/12/18 0424  WBC 18.8*  HGB 9.7*  HCT 31.0*  PLT 420*   ------------------------------------------------------------------------------------------------------------------  Chemistries  Recent Labs  Lab 12/11/18 0846 12/12/18 0424  NA 131* 134*  K 4.8 4.5  CL 104 108  CO2 22 20*  GLUCOSE 103* 90  BUN 42* 37*  CREATININE 1.23 1.17  CALCIUM 7.5* 7.2*  AST 37  --   ALT 35  --   ALKPHOS 72  --   BILITOT 1.0  --    ------------------------------------------------------------------------------------------------------------------  Cardiac Enzymes Recent Labs  Lab  12/11/18 0846  TROPONINI <0.03   ------------------------------------------------------------------------------------------------------------------  RADIOLOGY:  Ct Angio Chest Pe W And/or Wo Contrast  Result Date: 12/11/2018 CLINICAL DATA:  85 year old male with acute shortness shortness of breath. History of lung cancer and interstitial lung disease. EXAM: CT ANGIOGRAPHY CHEST WITH CONTRAST TECHNIQUE: Multidetector CT imaging of the chest was performed using the standard protocol during bolus administration of intravenous contrast. Multiplanar CT image reconstructions and MIPs were obtained to evaluate the vascular anatomy. CONTRAST:  47mL OMNIPAQUE IOHEXOL 350 MG/ML SOLN COMPARISON:  12/11/2018 chest radiograph, 08/09/2017 chest CT and prior studies FINDINGS: Cardiovascular: This is a technically satisfactory study. No pulmonary emboli identified. Moderate to marked cardiomegaly again noted. Aortic valvular calcifications. Again noted without thoracic aortic aneurysm. Coronary artery and aortic atherosclerotic calcifications are present. No pericardial effusion. Mediastinum/Nodes: No enlarged lymph nodes.  No mediastinal mass. Lungs/Pleura: RIGHT hemithorax volume loss again noted. Moderate round atelectasis in the mid and LOWER RIGHT lung again noted. A small loculated RIGHT pleural effusion is unchanged. A new moderate LEFT pleural effusion and LEFT LOWER lung atelectasis identified. Mild central bilateral ground-glass opacities are identified and may represent mild edema but nonspecific. No pneumothorax. No new pulmonary opacities are identified otherwise. Centrilobular and paraseptal emphysema identified. Upper Abdomen: Small amount of ascites in the UPPER abdomen noted. Diffuse subcutaneous edema noted. Musculoskeletal: No acute or suspicious bony abnormality. A T4 compression fracture is unchanged. Review of the MIP images confirms the above findings. IMPRESSION: 1. No evidence of pulmonary emboli. 2. New  moderate LEFT pleural effusion and LEFT basilar atelectasis. 3. Mild central bilateral ground-glass opacities which may represent mild pulmonary edema. 4. Small amount of ascites and diffuse subcutaneous edema. 5. Unchanged small loculated RIGHT pleural effusion and RIGHT lung atelectasis. 6. Cardiomegaly, aortic valvular calcifications and coronary artery disease. 7.  Aortic Atherosclerosis (ICD10-I70.0). Electronically Signed   By: Margarette Canada M.D.   On: 12/11/2018 12:41   Dg Chest Port 1 View  Result Date: 12/11/2018 CLINICAL DATA:  Shortness of breath EXAM: PORTABLE CHEST 1 VIEW COMPARISON:  11/30/2018 FINDINGS: Cardiac shadow is mildly enlarged but stable. Thoracic aorta is tortuous with calcifications stable from the prior exam. Right-sided pleural effusion is noted with slight increased capping superiorly. Patchy opacity is noted stable from the prior exam again likely related to scarring. The left lung remains clear. IMPRESSION: Chronic changes on the right.  No acute abnormality noted. Electronically Signed   By: Inez Catalina M.D.   On: 12/11/2018 09:48    ASSESSMENT AND PLAN:   Active Problems:   Weakness  85 year old male recently discharged from the hospital with C. difficile colitis and acute kidney injury who presents from [facility] due to clogged suprapubic catheter, generalized weakness and edema.  1.  Anasarca: Malnutrition, failure to thrive  this is due to low albumin with poor nutritional status and being bedbound. Not to start Lasix as patient has no other symptoms of CHF. I will recommend wound care nurse to use compression for right upper arm swelling. Dietary consultation for nutritional status. His upper extremity Doppler during last hospitalization was negative for DVT. Would also recommend to elevate the right arm He has significant malnutrition and failure to thrive for last at least 2 weeks or more. Family had requested Get hospice involvement  and make  arrangements if possible at home.  2.  Suprapubic cath:  changed out by ER physician and nursing staff  3.  Leukocytosis: It appears that patient's white blood cell count was 25 at discharge and remained stable. Patient was followed by Dr. Mike Gip for this. There does not seem to be an acute infection.  4.  C. difficile colitis during last hospitalization: Continue vancomycin to complete course  5.  Severe protein calorie malnutrition: Dietary consultation    Hospice to be involved at the time of discharge due to failure to thrive.  6.  Chronic atrial fibrillation currently in atrial fibrillation: Continue Xarelto  7.  Chronic systolic and diastolic heart failure ejection fraction 45%:  8.  New left pleural effusion: Discussed this with the family.  At this time we will use incentive spirometer but no aggressive measures such as thoracentesis unless patient shows decline in respiratory status.  9.  Hypothyroidism: Continue Synthroid-TSH was within normal range in last admission.  10.  Hyponatremia: This is chronic in nature and stable  11.  Hyperlipidemia: Continue statin   I had discussion with patient's 2 sons and 1 daughter in the room today.  They have understood his terminal conditions due to old age and failure to thrive and anasarca.  They are willing to involve hospice in his care.  He does not seem to be having less than 2 weeks of prognosis so I have advised to have hospice involvement at home for him.  They are not interested on sending him to any skilled nursing facilities for that.  All the records are reviewed and case discussed with Care Management/Social Workerr. Management plans discussed with the patient, family and they are in agreement.  CODE STATUS: .  DNR.  TOTAL TIME TAKING CARE OF THIS PATIENT: 104.  Minutes.   Discussed with case manager and patient's family.  POSSIBLE D/C IN 1-2.  DAYS, DEPENDING ON CLINICAL CONDITION.   Vaughan Basta M.D on 12/12/2018   Between 7am to 6pm - Pager - [phone]  After 6pm go to [email or web address] - password [email or web address] [facility]  Office  [phone]  CC: Primary care physician; Kirk Ruths, MD  Note: This dictation was prepared with Dragon dictation along with smaller phrase technology. Any transcriptional errors that result from this process are unintentional.

## 2018-12-12 NOTE — Progress Notes (Signed)
New referral for hospice services at home received from [facility]. Patient is a 85 year old man with a history of recent hospitalization with C. difficile colitis, chronic atrial fibrillation, Chronic systolic and diastolic heart failure ejection fraction 45% and recent left intertrochanteric fracture repair admitted from [facility] on 3/1 due to clogged suprapubic catheter, generalized weakness and increasing edema. He has continued to decline despite medical interventions. Family has talked with attending physician Dr. Anselm Jungling, they have chosen to focus on patient's comfort with support of hospice services at home. Writer spoke on the phone and met with patient's son Matthew Hicks to initiate education regarding hospice services, philosophy and team approach to care with understating voiced. Patient will need DME in place prior to discharge. Hospital bed and oxygen ordered for delivery tomorrow 3/3. Patient will require EMS transport home with signed DNR in place. Hospice information and contact number given to [facility]. Patient seen lying in bed, appeared to be sleeping. Matthew Hicks report he sleeps most of the time and needs assistance with his meals. He currently has 2 stage II pressure ulcers and remains on contact precautions for a history of C-diff. Patient information faxed to referral. Will continue to follow through discharge. Flo Shanks BSN, RN, [facility] [phone]

## 2018-12-12 NOTE — Consult Note (Addendum)
[place] Nurse wound consult note Patient receiving care in [facility].  Son and primary RN present Reason for Consult: Right arm edema Patient has Anasarca.  Compression wrap to an arm for anasarda is not indicated.  Elevating the arms above heart level, however, is indicated and has been ordered.  Floating heels, turning the patient every 2 hours and avoiding the supine position, as well as an air mattress and linen instructions have been entered.  The patient has an unstageable PI to the right buttock that measures approximately 4 cm x 0.6 cm; it has a yellow wound bed.  The surrounding tissue has MASD and fungal rash--antifungal powder has been ordered for these areas.  There may also be the beginning of DTPI to the region.  Santyl has been ordered for this.  On the left lateral lower leg there are two distinct spots that are unstageable PIs (from an unknown source) that are separated by a pink area of partial tissue loss.  These wounds need Santyl also. The son states all of these areas were present when the patient was at his rehab center. Monitor the wound area(s) for worsening of condition such as: Signs/symptoms of infection,  Increase in size,  Development of or worsening of odor, Development of pain, or increased pain at the affected locations.  Notify the medical team if any of these develop.  Thank you for the consult.  Discussed plan of care with the patient and bedside nurse.  [place] nurse will not follow at this time.  Please re-consult the [place] team if needed.  Val Riles, RN, MSN, CWOCN, CNS-BC, pager [phone] .

## 2018-12-12 NOTE — Progress Notes (Signed)
Initial Nutrition Assessment  DOCUMENTATION CODES:   Severe malnutrition in context of chronic illness  INTERVENTION:   - Continue Ensure Enlive po TID, each supplement provides 350 kcal and 20 grams of protein  - Continue Ocuvite MVI daily  - Continue vitamin C 500 mg daily per MD  NUTRITION DIAGNOSIS:   Severe Malnutrition related to chronic illness (CHF) as evidenced by severe muscle depletion, severe fat depletion, edema.  GOAL:   Patient will meet greater than or equal to 90% of their needs  MONITOR:   PO intake, Supplement acceptance, I & O's, Weight trends, Labs, Skin  REASON FOR ASSESSMENT:   Malnutrition Screening Tool, Consult Assessment of nutrition requirement/status  ASSESSMENT:   85 year old male who presented to the ED on 3/1 from [facility] with C-diff, weakness, and possible clogged suprapubic cath. PMH significant for L intertrochanteric fracture s/p repair 11/16/18, atrial fibrillation, GERD, HLD, HTN, CHF. Pt recently admitted to the hospital (discharged 12/07/18) for sepsis w/ C-diff colitis. CT performed in the ED shows new L pleural effusion. Pt admitted with anasarca, new L pleural effusion.  Attempted to speak with pt at bedside. Pt sleeping soundly but awoke to RD voice. Pt is a poor historian, answering "I don't know" to most of RD questions regarding appetite, PO intake, and weight. Pt states "I think so" when asked if he has a good appetite. No family present at time of RD visit.  No meal completion recorded in pt's chart at this time.  Reviewed weight encounters in chart. Weight on admission was 60.9 kg. However, weight recorded today was 76.7 kg. Suspect one of these weights was entered in error given discrepancy. Per chart, pt's weight has been trending down over the last 5 months (70.8 kg on 08/10/18 to 60.9 kg on admission). This is a 9.9 kg weight loss (14% weight loss) which is significant for timeframe.  Reviewed WOC note. Pt with an  unstageable pressure injury to R buttock (4 cm x 0.6 cm). Pt also with possible beginning of deep tissue pressure injury to the same region. There are two unstageable pressure injuries to pt's left lateral lower leg.  Noted family considering hospice. Will follow for [place].  Medications reviewed and include: Ensure Enlive TID, MVI with minerals daily, Ocuvite daily, Miralax, vitamin B-12 5,000 mcg daily, vitamin C 500 mg daily, zinc sulfate 220 mg daily IVF: NS @ 50 ml/hr  Labs reviewed: sodium 134 (L), BUN 37 (H), hemoglobin 9.7 (L)  UOP: 400 ml x 24 hours recorded  NUTRITION - FOCUSED PHYSICAL EXAM:    Most Recent Value  Orbital Region  Severe depletion  Upper Arm Region  Unable to assess [severe edema to BUE]  Thoracic and Lumbar Region  Severe depletion  Buccal Region  Severe depletion  Temple Region  Severe depletion  Clavicle Bone Region  Severe depletion  Clavicle and Acromion Bone Region  Severe depletion  Scapular Bone Region  Severe depletion  Dorsal Hand  Unable to assess [severe edema to BUE]  Patellar Region  Unable to assess [severe edema to BLE]  Anterior Thigh Region  Unable to assess  Posterior Calf Region  Unable to assess  Edema (RD Assessment)  Severe [BUE, BLE]  Hair  Reviewed  Eyes  Reviewed  Mouth  Reviewed  Skin  Reviewed  Nails  Reviewed       Diet Order:   Diet Order            Diet regular Room service appropriate? Yes;  Fluid consistency: Thin  Diet effective now              EDUCATION NEEDS:   Not appropriate for education at this time  Skin:  Skin Assessment (per WOC note): Skin Integrity Issues: Unstageable: R buttock (also possible DTI to this region), two unstageable pressure injuries to left lateral lower leg Incision: L hip  Last BM:  12/11/18  Height:   Ht Readings from Last 1 Encounters:  12/12/18 5\' 4"  (1.626 m)    Weight:   Wt Readings from Last 1 Encounters:  12/12/18 76.7 kg    Ideal Body Weight:  59.1 kg  BMI:   Body mass index is 29.02 kg/m.  Estimated Nutritional Needs:   Kcal:  1400-1600  Protein:  70-85 grams  Fluid:  1.4-1.6 L    Gaynell Face, MS, RD, LDN Inpatient Clinical Dietitian Pager: [phone] Weekend/After Hours: [phone]

## 2018-12-12 NOTE — Care Management Note (Signed)
Case Management Note  Patient Details  Name: Matthew Hicks. MRN: [id number] Date of Birth: 1925/06/20   Patient admitted from [facility] with Anasarca.  Plan for patient to discharge home with hospice services.  [facility] has selected [facility].  Referral made to [facility] with [facility].  Patient lives at home with his wife.  His daughter and 2 sons help provided 24/7 care.  It is not determined at this time if patient will return to his home at discharge, or to the home of his son.  Patient has RW, cane, BSC. Patient will require hospital bed, and O2 for home discharge.  Patient will be evaluated in the home fot the need of a WC and lift.    Subjective/Objective:                    Action/Plan:   Expected Discharge Date:                  Expected Discharge Plan:  Home w Hospice Care  In-House Referral:     Discharge planning Services  CM Consult  Post Acute Care Choice:  Hospice Choice offered to:  Adult Children  DME Arranged:    DME Agency:     HH Arranged:    HH Agency:  [facility]  Status of Service:  In process, will continue to follow  If discussed at Long Length of Stay Meetings, dates discussed:    Additional Comments:  Beverly Sessions, RN 12/12/2018, 2:36 PM

## 2018-12-13 LAB — URINE CULTURE: Culture: 80000 — AB

## 2018-12-13 MED ORDER — VANCOMYCIN 50 MG/ML ORAL SOLUTION: 125.0000 mg | Freq: Four times a day (QID) | ORAL | 0 refills | Status: AC

## 2018-12-13 MED ORDER — PEG 3350 17 GM/SCOOP PO POWD: 17.0000 g | Freq: Every day | ORAL | 0 refills | Status: AC | PRN

## 2018-12-13 MED ORDER — OXYCODONE HCL 5 MG PO TABS: 5.0000 mg | ORAL_TABLET | ORAL | 0 refills | Status: AC | PRN

## 2018-12-13 NOTE — Discharge Summary (Addendum)
[place] [facility] [place] NAME: Matthew Hicks    MR#:  [id number]  DATE OF BIRTH:  12/27/1924  DATE OF ADMISSION:  12/11/2018 ADMITTING PHYSICIAN: Bettey Costa, MD  DATE OF DISCHARGE: 12/13/2018   PRIMARY CARE PHYSICIAN: Kirk Ruths, MD    ADMISSION DIAGNOSIS:  Hypoxia [R09.02] Pleural effusion on left [D14] Mechanical complication of suprapubic catheter, initial encounter ([place]) [T83.090A] Edema, unspecified type [R60.9]  DISCHARGE DIAGNOSIS:  Active Problems:   Weakness    C diff   Failure to thrive  SECONDARY DIAGNOSIS:   Past Medical History:  Diagnosis Date  . A-fib ([place]) 04/08/2014  . Aortic stenosis   . Cardiomyopathy ([place])   . Cataract   . CVA (cerebral infarction) 2003  . Dysrhythmia   . GERD (gastroesophageal reflux disease)   . Hearing loss   . Hyperlipidemia   . Hypertension 05/04/2014  . Lung mass   . Macular degeneration   . Nephrolithiasis   . Pernicious anemia   . Pneumonia 05/2015  . Prostate cancer [facility]) 2007   performed by Dr. Eliberto Ivory  . Skin cancer   . Spinal stenosis   . Stroke [facility])     HOSPITAL COURSE:   85 year old male recently discharged from the hospital with C. difficile colitis and acute kidney injury who [facility] due to clogged suprapubic catheter, generalized weakness and edema.  1. Anasarca: Malnutrition, failure to thrive  this is due to low albumin with poor nutritional status and being bedbound. Not to start Lasix as patient has no other symptoms of CHF. I will recommend wound care nurse to use compression for right upper arm swelling. Dietary consultation for nutritional status. His upper extremity Doppler during last hospitalization was negative for DVT. Would also recommend to elevate the right arm He has significant malnutrition and failure to thrive for last at least 2 weeks or more. Family had requested Get hospice involvement and make arrangements if  possible at home.  2. Suprapubic cath:  changed out by ER physician and nursing staff  3. Leukocytosis: It appears that patient's white blood cell count was 25 at discharge and remained stable. Patient was followed by Dr. Mike Gip for this. There does not seem to be an acute infection.  4. C. difficile colitis during last hospitalization: Continue vancomycin to complete course  5. Severe protein calorie malnutrition: Dietary consultation    Hospice to be involved at the time of discharge due to failure to thrive.  6. Chronic atrial fibrillation currently in atrial fibrillation: Continue Xarelto  7. Chronic systolic and diastolic heart failure ejection fraction 45%:  8. New left pleural effusion: Discussed this with the family. At this time we will use incentive spirometer but no aggressive measures such as thoracentesis unless patient shows decline in respiratory status.  9. Hypothyroidism: Continue Synthroid-TSH was within normal range in last admission.  10. Hyponatremia: This is chronic in nature and stable  11. Hyperlipidemia: Continue statin  have chronic catheter- urine cx have proteus- but pt have no symptoms- Due to active C diff and no urinary symptoms- I will not treat Ur cx.\  I had discussion with patient's 2 sons and 1 daughter in the room today.  They have understood his terminal conditions due to old age and failure to thrive and anasarca.  They are willing to involve hospice in his care.  He does not seem to be having less than 2 weeks of prognosis so I have advised  to have hospice involvement at home for him.  They are not interested on sending him to any skilled nursing facilities for that.  Meeting with hospice nurse- and family have arranged to take him home with hospcie- his prognosis is in weeks to month due to failure to thrive and low albumin with old age, C diff, recent fracture.  DISCHARGE CONDITIONS:   Stable.  CONSULTS OBTAINED:     DRUG ALLERGIES:   Allergies  Allergen Reactions  . Amoxicillin-Pot Clavulanate Swelling    Angioedema Has patient had a PCN reaction causing immediate rash, facial/tongue/throat swelling, SOB or lightheadedness with hypotension: Yes Has patient had a PCN reaction causing severe rash involving mucus membranes or skin necrosis: No Has patient had a PCN reaction that required hospitalization: No Has patient had a PCN reaction occurring within the last 10 years: Yes If all of the above answers are "NO", then may proceed with Cephalosporin use.   Marland Kitchen Fesoterodine Fumarate Er Hives and Rash  . Sulfa Antibiotics Rash    DISCHARGE MEDICATIONS:   Allergies as of 12/13/2018      Reactions   Amoxicillin-pot Clavulanate Swelling   Angioedema Has patient had a PCN reaction causing immediate rash, facial/tongue/throat swelling, SOB or lightheadedness with hypotension: Yes Has patient had a PCN reaction causing severe rash involving mucus membranes or skin necrosis: No Has patient had a PCN reaction that required hospitalization: No Has patient had a PCN reaction occurring within the last 10 years: Yes If all of the above answers are "NO", then may proceed with Cephalosporin use.   Fesoterodine Fumarate Er Hives, Rash   Sulfa Antibiotics Rash      Medication List    TAKE these medications   acetaminophen 650 MG CR tablet Commonly known as:  TYLENOL Take 650 mg by mouth every 8 (eight) hours as needed for pain.   amiodarone 100 MG tablet Commonly known as:  PACERONE Take 100 mg by mouth daily.   atorvastatin 20 MG tablet Commonly known as:  LIPITOR Take 20 mg by mouth daily.   azelastine 0.1 % nasal spray Commonly known as:  ASTELIN Place 2 sprays into the nose daily as needed for rhinitis or allergies.   B-12 5000 MCG Caps Take 5,000 mcg by mouth daily.   docusate sodium 100 MG capsule Commonly known as:  COLACE Take 1 capsule (100 mg total) by mouth 2 (two) times daily as  needed for mild constipation.   feeding supplement (ENSURE ENLIVE) Liqd Take 237 mLs by mouth 3 (three) times daily between meals.   ICAPS Caps Take 1 tablet by mouth daily.   levothyroxine 75 MCG tablet Commonly known as:  SYNTHROID, LEVOTHROID Take 75 mcg by mouth daily.   multivitamin with minerals Tabs tablet Take 1 tablet by mouth daily.   oxyCODONE 5 MG immediate release tablet Commonly known as:  Oxy IR/ROXICODONE Take 1 tablet (5 mg total) by mouth every 4 (four) hours as needed for moderate pain (pain score 4-6).   PEG 3350 Powd Take 17 g by mouth daily as needed (for constipation). What changed:    when to take this  reasons to take this   vancomycin 50 mg/mL  oral solution Commonly known as:  VANCOCIN Take 2.5 mLs (125 mg total) by mouth 4 (four) times daily for 3 days.   vitamin C 250 MG tablet Commonly known as:  ASCORBIC ACID Take 500 mg by mouth daily.   XARELTO 10 MG Tabs tablet Generic drug:  rivaroxaban  TAKE 1 TABLET(10 MG TOTAL) BY MOUTH DAILY WITH DINNER.   zinc sulfate 220 (50 Zn) MG capsule Take 220 mg by mouth daily.            Durable Medical Equipment  (From admission, onward)         Start     Ordered   12/12/18 1432  For home use only DME oxygen  Once    Comments:  Diagnosis- Cardiomyopathy, CHF  Question Answer Comment  Mode or (Route) Nasal cannula   Liters per Minute 2   Frequency Continuous (stationary and portable oxygen unit needed)   Oxygen conserving device Yes   Oxygen delivery system Gas      12/12/18 1432           DISCHARGE INSTRUCTIONS:    Follow by hospice at home.  If you experience worsening of your admission symptoms, develop shortness of breath, life threatening emergency, suicidal or homicidal thoughts you must seek medical attention immediately by calling 911 or calling your MD immediately  if symptoms less severe.  You Must read complete instructions/literature along with all the possible  adverse reactions/side effects for all the Medicines you take and that have been prescribed to you. Take any new Medicines after you have completely understood and accept all the possible adverse reactions/side effects.   Please note  You were cared for by a hospitalist during your hospital stay. If you have any questions about your discharge medications or the care you received while you were in the hospital after you are discharged, you can call the unit and asked to speak with the hospitalist on call if the hospitalist that took care of you is not available. Once you are discharged, your primary care physician will handle any further medical issues. Please note that NO REFILLS for any discharge medications will be authorized once you are discharged, as it is imperative that you return to your primary care physician (or establish a relationship with a primary care physician if you do not have one) for your aftercare needs so that they can reassess your need for medications and monitor your lab values.    Today   CHIEF COMPLAINT:   Chief Complaint  Patient presents with  . Weakness    HISTORY OF PRESENT ILLNESS:  Cordelro Gautreau  is a 85 y.o. male with a known history of recent hospitalization with C. difficile colitis, chronic atrial fibrillation, Chronic systolic and diastolic heart failure ejection fraction 45% and recent left intertrochanteric fracture repair who was [facility] due to clogged suprapubic catheter, generalized weakness and increasing edema.  Family is at bedside.  Patient reports no shortness of breath, nausea or vomiting.  Patient is having 2-3 bowel movements a day.  Patient denies abdominal pain. Suprapubic catheter was noted to be clogged this morning by the staff at the [facility].  He does state he has some generalized weakness.  He is pretty much bedbound since being in the hospital from C. difficile colitis.  Patient was discharged on 26  February on vancomycin.  He was also discharged on 2 L of oxygen. During last hospitalization it was noted that he had Proteus in his urine however this was felt to be due to chronic colonization Today's CT scan shows a new left pleural effusion.  Patient is on Xarelto for chronic atrial fibrillation.   ER physician and nursing staff will change out suprapubic cath. Patient is not complaining of dysuria, frequency urgency.  VITAL SIGNS:  Blood pressure (!) 121/49, pulse 62, temperature 97.6 F (36.4 C), temperature source Oral, resp. rate 19, height 5\' 4"  (1.626 m), weight 76.7 kg, SpO2 92 %.  I/O:    Intake/Output Summary (Last 24 hours) at 12/13/2018 1216 Last data filed at 12/13/2018 0200 Gross per 24 hour  Intake 617.14 ml  Output 0 ml  Net 617.14 ml    PHYSICAL EXAMINATION:  GENERAL:  85 y.o.-year-old patient, malnourished lying in the bed with no acute distress.  EYES: Pupils equal, round, reactive to light and accommodation. No scleral icterus. Extraocular muscles intact.  HEENT: Head atraumatic, normocephalic. Oropharynx and nasopharynx clear.  NECK:  Supple, no jugular venous distention. No thyroid enlargement, no tenderness.  LUNGS: Normal breath sounds bilaterally, no wheezing, some crepitation. No use of accessory muscles of respiration.  CARDIOVASCULAR: S1, S2 normal. No murmurs, rubs, or gallops.  ABDOMEN: Soft, nontender, nondistended. Bowel sounds present. No organomegaly or mass.  EXTREMITIES: Diffuse bilateral pedal edema also extending on both upper extremities, no cyanosis, or clubbing.  NEUROLOGIC: Cranial nerves II through XII are intact. Muscle strength 3/5 in all extremities. Sensation intact. Gait not checked.  PSYCHIATRIC: The patient is alert and oriented x 2.  SKIN: No obvious rash, lesion, or ulcer.     DATA REVIEW:   CBC Recent Labs  Lab 12/12/18 0424  WBC 18.8*  HGB 9.7*  HCT 31.0*  PLT 420*    Chemistries  Recent Labs  Lab  12/11/18 0846 12/12/18 0424  NA 131* 134*  K 4.8 4.5  CL 104 108  CO2 22 20*  GLUCOSE 103* 90  BUN 42* 37*  CREATININE 1.23 1.17  CALCIUM 7.5* 7.2*  AST 37  --   ALT 35  --   ALKPHOS 72  --   BILITOT 1.0  --     Cardiac Enzymes Recent Labs  Lab 12/11/18 0846  TROPONINI <0.03    Microbiology Results  Results for orders placed or performed during the hospital encounter of 12/11/18  Blood culture (routine x 2)     Status: None (Preliminary result)   Collection Time: 12/11/18  8:46 AM  Result Value Ref Range Status   Specimen Description BLOOD R HAND  Final   Special Requests   Final    BOTTLES DRAWN AEROBIC AND ANAEROBIC Blood Culture results may not be optimal due to an inadequate volume of blood received in culture bottles   Culture   Final    NO GROWTH 2 DAYS Performed at [facility], [address] [place], [place] [address]    Report Status PENDING  Incomplete  Blood culture (routine x 2)     Status: None (Preliminary result)   Collection Time: 12/11/18  8:48 AM  Result Value Ref Range Status   Specimen Description BLOOD LAC  Final   Special Requests   Final    BOTTLES DRAWN AEROBIC AND ANAEROBIC Blood Culture results may not be optimal due to an excessive volume of blood received in culture bottles   Culture   Final    NO GROWTH 2 DAYS Performed at [facility], [address] [place], [place] [address]    Report Status PENDING  Incomplete  Urine culture     Status: Abnormal   Collection Time: 12/11/18  2:39 PM  Result Value Ref Range Status   Specimen Description   Final    URINE, RANDOM Performed at [facility], [address], [place], [place] [address]  Special Requests   Final    NONE Performed at [facility], [place], [place] [address]    Culture 80,000 COLONIES/mL PROTEUS MIRABILIS (A)  Final   Report Status 12/13/2018 FINAL  Final   Organism ID, Bacteria PROTEUS MIRABILIS (A)  Final       Susceptibility   Proteus mirabilis - MIC*    AMPICILLIN 4 SENSITIVE Sensitive     CEFAZOLIN 16 SENSITIVE Sensitive     CEFTRIAXONE <=1 SENSITIVE Sensitive     CIPROFLOXACIN >=4 RESISTANT Resistant     GENTAMICIN <=1 SENSITIVE Sensitive     IMIPENEM 4 SENSITIVE Sensitive     NITROFURANTOIN 128 RESISTANT Resistant     TRIMETH/SULFA <=20 SENSITIVE Sensitive     AMPICILLIN/SULBACTAM <=2 SENSITIVE Sensitive     * 80,000 COLONIES/mL PROTEUS MIRABILIS  MRSA PCR Screening     Status: None   Collection Time: 12/12/18  6:47 AM  Result Value Ref Range Status   MRSA by PCR NEGATIVE NEGATIVE Final    Comment:        The GeneXpert MRSA Assay (FDA approved for NASAL specimens only), is one component of a comprehensive MRSA colonization surveillance program. It is not intended to diagnose MRSA infection nor to guide or monitor treatment for MRSA infections. Performed at [facility], [address], [place],  [address]     RADIOLOGY:  No results found.  EKG:   Orders placed or performed during the hospital encounter of 12/11/18  . ED EKG  . ED EKG  . EKG 12-Lead  . EKG 12-Lead      Management plans discussed with the patient, family and they are in agreement.  CODE STATUS: DNR    Code Status Orders  (From admission, onward)         Start     Ordered   12/11/18 1420  Do not attempt resuscitation (DNR)  Continuous    Question Answer Comment  In the event of cardiac or respiratory ARREST Do not call a "code blue"   In the event of cardiac or respiratory ARREST Do not perform Intubation, CPR, defibrillation or ACLS   In the event of cardiac or respiratory ARREST Use medication by any route, position, wound care, and other measures to relive pain and suffering. May use oxygen, suction and manual treatment of airway obstruction as needed for comfort.   Comments RN may pronounce      12/11/18 1419        Code Status History    Date Active Date  Inactive Code Status Order ID Comments User Context   11/30/2018 2249 12/07/2018 2131 DNR [id number]  Nicholes Mango, MD ED   11/16/2018 1739 11/20/2018 2031 Full Code [id number]  Nicholes Mango, MD Inpatient   05/23/2017 2213 05/27/2017 1734 Full Code [id number]  Henreitta Leber, MD Inpatient    Advance Directive Documentation     Most Recent Value  Type of Advance Directive  Healthcare Power of [place], Living will, Out of facility DNR (pink MOST or yellow form)  Pre-existing out of facility DNR order (yellow form or pink MOST form)  -  "MOST" Form in Place?  -      TOTAL TIME TAKING CARE OF THIS PATIENT:35 minutes.    Vaughan Basta M.D on 12/13/2018 at 12:16 PM  Between 7am to 6pm - Pager - [phone]  After 6pm go to [email or web address] - password [email or web address] [email or web address] [facility]  Office  [phone]  CC: Primary  care physician; Kirk Ruths, MD   Note: This dictation was prepared with Dragon dictation along with smaller phrase technology. Any transcriptional errors that result from this process are unintentional.

## 2018-12-13 NOTE — Progress Notes (Signed)
Follow up visit made to new referral for hospice services at home. pPtient seen lying in bed,  Daughter at bedside. Writer spoke with patient's son [name], bed and oxygen has been delivered. Staff RN Elita Quick notified to notify EMS for transport home. Signed DNR in place. Discharge summary faxed to referral.  Flo Shanks BSN, RN, [facility] Clinical liaison [facility] [phone]

## 2018-12-13 NOTE — Care Management Note (Signed)
Case Management Note  Patient Details  Name: Matthew Hicks. MRN: [id number] Date of Birth: 1925/07/25   Patient to discharge today after equipment is delivered to the home. EMS packet on chart  Subjective/Objective:                    Action/Plan:   Expected Discharge Date:  12/13/18               Expected Discharge Plan:  Home w Hospice Care  In-House Referral:     Discharge planning Services  CM Consult  Post Acute Care Choice:  Hospice Choice offered to:  Adult Children  DME Arranged:    DME Agency:     HH Arranged:    HH Agency:  [facility]  Status of Service:  Completed, signed off  If discussed at [place] of Stay Meetings, dates discussed:    Additional Comments:  Beverly Sessions, RN 12/13/2018, 1:37 PM

## 2018-12-13 NOTE — Discharge Instructions (Signed)
Hospice to follow at home 

## 2018-12-13 NOTE — Progress Notes (Signed)
RN notified Matthew Hicks pt's son his dad is waiting for EMS to be transfer,.

## 2018-12-16 LAB — BLOOD GAS, VENOUS
ACID-BASE DEFICIT: 1.8 mmol/L (ref 0.0–2.0)
Bicarbonate: 23.1 mmol/L (ref 20.0–28.0)
O2 Saturation: 41.1 %
PCO2 VEN: 39 mmHg — AB (ref 44.0–60.0)
Patient temperature: 37
pH, Ven: 7.38 (ref 7.250–7.430)

## 2018-12-16 LAB — CULTURE, BLOOD (ROUTINE X 2)
Culture: NO GROWTH
Culture: NO GROWTH

## 2018-12-21 ENCOUNTER — Encounter: Payer: Medicare Other | Admitting: Hematology and Oncology

## 2018-12-28 ENCOUNTER — Emergency Department

## 2018-12-28 ENCOUNTER — Other Ambulatory Visit: Payer: Self-pay

## 2018-12-28 ENCOUNTER — Encounter: Payer: Self-pay | Admitting: *Deleted

## 2018-12-28 ENCOUNTER — Emergency Department
Admission: EM | Admit: 2018-12-28 | Discharge: 2018-12-28 | Disposition: A | Discharge status: Home / Self Care | Attending: Emergency Medicine | Admitting: Emergency Medicine

## 2018-12-28 DIAGNOSIS — Y69 Unspecified misadventure during surgical and medical care: Secondary | ICD-10-CM | POA: Diagnosis not present

## 2018-12-28 DIAGNOSIS — Z8673 Personal history of transient ischemic attack (TIA), and cerebral infarction without residual deficits: Secondary | ICD-10-CM | POA: Diagnosis not present

## 2018-12-28 DIAGNOSIS — R05 Cough: Secondary | ICD-10-CM | POA: Diagnosis not present

## 2018-12-28 DIAGNOSIS — I959 Hypotension, unspecified: Secondary | ICD-10-CM | POA: Diagnosis not present

## 2018-12-28 DIAGNOSIS — Z87891 Personal history of nicotine dependence: Secondary | ICD-10-CM | POA: Insufficient documentation

## 2018-12-28 DIAGNOSIS — Z85828 Personal history of other malignant neoplasm of skin: Secondary | ICD-10-CM | POA: Insufficient documentation

## 2018-12-28 DIAGNOSIS — Z8546 Personal history of malignant neoplasm of prostate: Secondary | ICD-10-CM | POA: Diagnosis not present

## 2018-12-28 DIAGNOSIS — R070 Pain in throat: Secondary | ICD-10-CM | POA: Insufficient documentation

## 2018-12-28 DIAGNOSIS — I10 Essential (primary) hypertension: Secondary | ICD-10-CM | POA: Insufficient documentation

## 2018-12-28 DIAGNOSIS — T83010A Breakdown (mechanical) of cystostomy catheter, initial encounter: Secondary | ICD-10-CM | POA: Diagnosis not present

## 2018-12-28 DIAGNOSIS — E876 Hypokalemia: Secondary | ICD-10-CM | POA: Diagnosis not present

## 2018-12-28 DIAGNOSIS — R059 Cough, unspecified: Secondary | ICD-10-CM

## 2018-12-28 LAB — CBC
HCT: 36.6 % — ABNORMAL LOW (ref 39.0–52.0)
Hemoglobin: 11.1 g/dL — ABNORMAL LOW (ref 13.0–17.0)
MCH: 25.6 pg — ABNORMAL LOW (ref 26.0–34.0)
MCHC: 30.3 g/dL (ref 30.0–36.0)
MCV: 84.3 fL (ref 80.0–100.0)
Platelets: 338 10*3/uL (ref 150–400)
RBC: 4.34 MIL/uL (ref 4.22–5.81)
RDW: 18.5 % — AB (ref 11.5–15.5)
WBC: 12.2 10*3/uL — ABNORMAL HIGH (ref 4.0–10.5)
nRBC: 0 % (ref 0.0–0.2)

## 2018-12-28 LAB — BASIC METABOLIC PANEL
Anion gap: 9 (ref 5–15)
BUN: 18 mg/dL (ref 8–23)
CALCIUM: 7.4 mg/dL — AB (ref 8.9–10.3)
CO2: 35 mmol/L — ABNORMAL HIGH (ref 22–32)
Chloride: 95 mmol/L — ABNORMAL LOW (ref 98–111)
Creatinine, Ser: 1.07 mg/dL (ref 0.61–1.24)
GFR calc Af Amer: >60 mL/min (ref >60)
GFR calc non Af Amer: >60 mL/min (ref >60)
Glucose, Bld: 98 mg/dL (ref 70–99)
Potassium: 2.7 mmol/L — CL (ref 3.5–5.1)
Sodium: 139 mmol/L (ref 135–145)

## 2018-12-28 LAB — INFLUENZA PANEL BY PCR (TYPE A & B)
INFLBPCR: NEGATIVE
Influenza A By PCR: NEGATIVE

## 2018-12-28 LAB — GROUP A STREP BY PCR: Group A Strep by PCR: NOT DETECTED

## 2018-12-28 MED ORDER — POTASSIUM CHLORIDE CRYS ER 20 MEQ PO TBCR
40.0000 meq | EXTENDED_RELEASE_TABLET | Freq: Once | ORAL | Status: AC
  Administered 2018-12-28: 40 meq via ORAL
  Filled 2018-12-28: qty 2

## 2018-12-28 MED ORDER — SODIUM CHLORIDE 0.9 % IV BOLUS
1000.0000 mL | Freq: Once | INTRAVENOUS | Status: AC
  Administered 2018-12-28: 1000 mL via INTRAVENOUS

## 2018-12-28 MED ORDER — POTASSIUM CHLORIDE ER 20 MEQ PO TBCR: 40.0000 meq | EXTENDED_RELEASE_TABLET | Freq: Every day | ORAL | 0 refills | Status: AC

## 2018-12-28 NOTE — Progress Notes (Signed)
Patient is currently followed by [facility] with a hospice diagnosis of Hypertnesive heart disease with heart failure. He is a DNR code with out of facility DNR in place in the home. He was sent to the [facility] ED today for evaluation of a leaking supra pubic catheter that was unable to be changed out at home by his hospice RN. Please contact hospice at [phone] with any questions. Thank you. Flo Shanks BSN, RN, [facility] Liaison [facility]

## 2018-12-28 NOTE — ED Notes (Signed)
Suprapubic catheter replaced by Dr Mariea Clonts. Catheter 18 Fr with 60mL balloon. Pt tolerated procedure well. New leg bag provided. Approx 120 mL of urine collected in bag at end of procedure. Suprapubic site dressed with sterile sponges. Pt resting comfortably.

## 2018-12-28 NOTE — ED Provider Notes (Signed)
[facility] Emergency Department Provider Note  ____________________________________________  Time seen: Approximately 5:13 PM  I have reviewed the triage vital signs and the nursing notes.   HISTORY  Chief Complaint Urinary cath problem    HPI Matthew Hicks. is a 85 y.o. male history of indwelling suprapubic catheter, discharged 12/13/2018 after admission for hypoxia with pleural effusion, recent C. 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 fib on Xarelto, presenting for concerns about clogged catheter.  Per report, the patient's nurse stated that she was able to flush the patient's catheter but felt some resistance.  The patient has not been having any pain, and the catheter does continue to drain urine.  He has not had any fevers, chills, nausea or vomiting.  However, on my exam the patient is noted to have a cough and his family states that since Sunday, he has been having a mild cough that is occasionally productive of thick yellow sputum as well as a sore throat.  No fevers or chills.  Past Medical History:  Diagnosis Date  . A-fib ([place]) 04/08/2014  . Aortic stenosis   . Cardiomyopathy ([place])   . Cataract   . CVA (cerebral infarction) 2003  . Dysrhythmia   . GERD (gastroesophageal reflux disease)   . Hearing loss   . Hyperlipidemia   . Hypertension 05/04/2014  . Lung mass   . Macular degeneration   . Nephrolithiasis   . Pernicious anemia   . Pneumonia 05/2015  . Prostate cancer [facility]) 2007   performed by Dr. Eliberto Ivory  . Skin cancer   . Spinal stenosis   . Stroke [facility])     Patient Active Problem List   Diagnosis Date Noted  . Weakness 12/11/2018  . Protein-calorie malnutrition, severe 12/06/2018  . Pressure injury of skin 12/01/2018  . Acute colitis 11/30/2018  . Closed left hip fracture, initial encounter ([place]) 11/16/2018  . Adrenal nodule ([place]) 07/15/2017  . Kidney stone on left side 05/23/2017  . Benign  paroxysmal positional vertigo due to bilateral vestibular disorder 01/07/2017  . Pure hypercholesterolemia 05/13/2016  . History of stroke 11/14/2015  . Cerebral vascular accident ([place]) 07/02/2014  . BP (high blood pressure) 05/04/2014  . A-fib ([place]) 04/08/2014  . Benign hypertension 04/08/2014  . Aortic heart valve narrowing 04/08/2014  . Cardiomyopathy ([place]) 04/08/2014  . Acid reflux 04/08/2014  . HLD (hyperlipidemia) 04/08/2014  . PA (pernicious anemia) 04/08/2014  . Spinal stenosis 04/08/2014    Past Surgical History:  Procedure Laterality Date  . CATARACT EXTRACTION    . CIRCUMCISION  1994  . CYSTOSCOPY  1946  . CYSTOSCOPY N/A 08/10/2018   Procedure: CYSTOSCOPY, superpubic catheter placement;  Surgeon: Royston Cowper, MD;  Location: [facility] ORS;  Service: Urology;  Laterality: N/A;  . CYSTOSCOPY W/ URETERAL STENT REMOVAL Left 06/15/2017   Procedure: CYSTOSCOPY WITH STENT REMOVAL, RETROGRADE, AND HOLMIUM LASER;  Surgeon: Royston Cowper, MD;  Location: [facility] ORS;  Service: Urology;  Laterality: Left;  . CYSTOSCOPY WITH STENT PLACEMENT Left 05/23/2017   Procedure: CYSTOSCOPY WITH STENT PLACEMENT;  Surgeon: Heron Sabins, MD;  Location: [facility] ORS;  Service: Urology;  Laterality: Left;  . INTRAMEDULLARY (IM) NAIL INTERTROCHANTERIC Left 11/18/2018   Procedure: INTRAMEDULLARY (IM) NAIL INTERTROCHANTRIC;  Surgeon: Dereck Leep, MD;  Location: [facility] ORS;  Service: Orthopedics;  Laterality: Left;  . laparoscopic prostectomy    . Spinal cyst removal    . TRANSURETHRAL RESECTION OF PROSTATE  2007   prostate  cancer    Current Outpatient Rx  . Order #: [id number] Class: Historical Med  . Order #: [id number] Class: Historical Med  . Order #: [id number] Class: Historical Med  . Order #: [id number] Class: Historical Med  . Order #: [id number] Class: Historical Med  . Order #: [id number] Class: No Print  . Order #: [id number] Class: Normal  . Order #: [id number] Class: Historical Med  . Order #:  [id number] Class: Historical Med  . Order #: [id number] Class: Historical Med  . Order #: [id number] Class: Print  . Order #: [id number] Class: Print  . Order #: [id number] Class: Print  . Order #: [id number] Class: Historical Med  . Order #: [id number] Class: Historical Med  . Order #: [id number] Class: Historical Med    Allergies Amoxicillin-pot clavulanate; Fesoterodine fumarate er; and Sulfa antibiotics  Family History  Problem Relation Age of Onset  . Stroke Father        heart disease  . Pneumonia Father   . Alzheimer's disease Brother   . Colon cancer Daughter 23  . Cancer - Other Paternal Aunt 9       "GYN cancer"  . Cancer - Other Paternal Grandmother 42       "GYN cancer"    Social History Social History   Tobacco Use  . Smoking status: Former Smoker    Packs/day: 1.00    Years: 40.00    Pack years: 40.00    Types: Cigarettes    Last attempt to quit: 06/08/1977    Years since quitting: 41.5  . Smokeless tobacco: Never Used  . Tobacco comment: does not smoke now  Substance Use Topics  . Alcohol use: No    Alcohol/week: 0.0 standard drinks  . Drug use: No    Review of Systems Constitutional: No fever/chills. Eyes: No visual changes. ENT: Positive sore throat. No congestion or rhinorrhea.   Cardiovascular: Denies chest pain. Denies palpitations. Respiratory: Denies shortness of breath.  Positive productive cough. Gastrointestinal: No abdominal pain.  No nausea, no vomiting.  No diarrhea.  No constipation. Genitourinary: No suprapubic pain or distention.  Concern for possible clogging of the suprapubic catheter. Musculoskeletal: Negative for back pain. Skin: Negative for rash. Neurological: Negative for headaches. No focal numbness, tingling or weakness.     ____________________________________________   PHYSICAL EXAM:  VITAL SIGNS: ED Triage Vitals  Enc Vitals Group     BP 12/28/18 1644 (!) 102/52     Pulse Rate 12/28/18 1644 99     Resp 12/28/18 1644 16      Temp 12/28/18 1644 (!) 97.5 F (36.4 C)     Temp Source 12/28/18 1644 Oral     SpO2 --      Weight 12/28/18 1647 167 lb 8.8 oz (76 kg)     Height 12/28/18 1647 5\' 4"  (1.626 m)     Head Circumference --      Peak Flow --      Pain Score --      Pain Loc --      Pain Edu? --      Excl. in [place]? --     Constitutional: Patient is alert and answers questions appropriately.  He is chronically ill-appearing and cachectic. Eyes: Conjunctivae are normal.  EOMI. No scleral icterus. Head: Atraumatic. Nose: No congestion/rhinnorhea. Mouth/Throat: Mucous membranes are mildly dry.  Mild posterior pharyngeal erythema.  The posterior palate is symmetric and the uvula is midline.  No hoarse voice, drooling, trismus or stridor. Neck: No stridor.  Supple.  No JVD.  No meningismus. Cardiovascular: Normal rate, regular rhythm. No  murmurs, rubs or gallops.  Respiratory: Normal respiratory effort.  No accessory muscle use or retractions. Lungs CTAB.  No wheezes, rales or ronchi. Gastrointestinal: Soft, and nondistended.  No guarding or rebound.  No peritoneal signs.  Patient has a suprapubic catheter in place which drains urine when I push in the suprapubic region; the urine is yellow and clear without significant sediment. Musculoskeletal: Moves all extremities well. Neurologic: Alert with clear speech.  Face and smile are symmetric.  EOMI.  Moves all extremities well. Skin: No rash noted. Psychiatric: Mood and affect are normal. Speech and behavior are normal.  Normal judgement.  ____________________________________________   LABS (all labs ordered are listed, but only abnormal results are displayed)  Labs Reviewed  CBC - Abnormal; Notable for the following components:      Result Value   WBC 12.2 (*)    Hemoglobin 11.1 (*)    HCT 36.6 (*)    MCH 25.6 (*)    RDW 18.5 (*)    All other components within normal limits  BASIC METABOLIC PANEL - Abnormal; Notable for the following components:    Potassium 2.7 (*)    Chloride 95 (*)    CO2 35 (*)    Calcium 7.4 (*)    All other components within normal limits  GROUP A STREP BY PCR  URINE CULTURE  INFLUENZA PANEL BY PCR (TYPE A & B)   ____________________________________________  EKG  Not indicated ____________________________________________  RADIOLOGY  Dg Chest 2 View  Result Date: 12/28/2018 CLINICAL DATA:  Cough for the past 3 days. EXAM: CHEST - 2 VIEW COMPARISON:  Chest x-rays CT chest dated December 11, 2018. FINDINGS: Stable cardiomegaly. Normal pulmonary vascularity. Grossly unchanged loculated right pleural effusion with right lower lobe atelectasis and scarring. Unchanged small left pleural effusion with mild left lower lobe atelectasis. No pneumothorax. No acute osseous abnormality. IMPRESSION: 1. No acute abnormality. 2. Unchanged bilateral pleural effusions and atelectasis. Electronically Signed   By: Titus Dubin M.D.   On: 12/28/2018 19:01    ____________________________________________   PROCEDURES  Procedure(s) performed: None  SUPRAPUBIC TUBE PLACEMENT Date/Time: 12/28/2018 7:21 PM Performed by: Eula Listen, MD Authorized by: Eula Listen, MD   Consent:    Consent obtained:  Verbal   Consent given by:  Patient (and son)   Risks discussed:  Bleeding, infection and pain Anesthesia (see MAR for exact dosages):    Anesthesia method:  None Procedure details:    Complexity:  Simple   Ultrasound guidance: no     Number of attempts:  1   Urine characteristics:  Blood-tinged, mildly cloudy and yellow Post-procedure details:    Patient tolerance of procedure:  Tolerated well, no immediate complications    Critical Care performed: No ____________________________________________   INITIAL IMPRESSION / ASSESSMENT AND PLAN / ED COURSE  Pertinent labs & imaging results that were available during my care of the patient were reviewed by me and considered in my medical decision making  (see chart for details).  85 y.o. male with an indwelling Foley catheter presenting for concerns about catheter clogging, but found to have cough and sore throat on my exam.  This patient does have a recent hospitalization and was found to have a left-sided pleural effusion with conservative management; did not have a thoracentesis.  I am concerned about an infectious etiology and will get influenza testing, chest x-ray and strep throat.  I am concerned the patient has borderline hypotension with a blood pressure of 92/57 on my exam.  Intravenous fluids have been ordered.  Plan reevaluation for final disposition.  ----------------------------------------- 7:08 PM on 12/28/2018 -----------------------------------------  From a cough perspective, the patient's white blood cell count is 12.2 today, which is lower than it has been in the recent past.  In addition he has stable unchanged pleural effusions.  His influenza testing and strep testing is negative.  He continues to maintain normal oxygen saturations.  At this time, I am not seeing clear signs of an acute pneumonia or disease process requiring antibiotics or admission.  I have talked to the family about the chance of early infection which could worsen and I have encouraged them to continue to have the in-home health evaluations daily including oxygen saturation and temperature checks.  Today, the patient's potassium is 2.7 and he has been supplemented orally.  I will plan to discharge him with a prescription for 4 days of potassium and instructions to have his potassium level rechecked later this week.  I have exchange the patient's Foley catheter in the patient is safe for discharge at this time.  I have had an extensive conversation with the patient and his family about his high risk of decompensation given his age, comorbidities, and recent interactions in the healthcare system and they understand to have a low threshold to bring him back for  further evaluation for any abnormalities.  Patient suprapubic catheter has been exchanged with no complications and good return of urine.  He is chronically colonized with Proteus mirabilis, so sent a urine culture directly.  We will follow-up the results with his PCP.  ____________________________________________  FINAL CLINICAL IMPRESSION(S) / ED DIAGNOSES  Final diagnoses:  Suprapubic catheter dysfunction, initial encounter (HCC)  Cough  Hypotension, unspecified hypotension type  Hypokalemia         NEW MEDICATIONS STARTED DURING THIS VISIT:  New Prescriptions   POTASSIUM CHLORIDE 20 MEQ TBCR    Take 40 mEq by mouth daily for 4 days.      Eula Listen, MD 12/28/18 1925

## 2018-12-28 NOTE — ED Notes (Signed)
Date and time results received: 12/28/18 6:08 PM   Test: K Critical Value: 2.7  Name of Provider Notified: Dr Mariea Clonts

## 2018-12-28 NOTE — ED Triage Notes (Addendum)
Pt to ED from home via EMS after nursing aide reported they were unable to change suprapubic catheter today (supposed to be changed every 30 days) EMS reports there have also been concerns for potential clogging and changes in drainage.   cc of urine noted with bladder scanner upon arrival. Significant edema noted to pts extremities and groin.  Clear urinary drainage noted in drainage bag but pts gown was wet upon arrival.

## 2018-12-28 NOTE — Discharge Instructions (Addendum)
Please monitor your suprapubic catheter and make sure that it continues to drain.  Please flush it regularly.  Please have your primary care physician follow-up the results of your urine culture today.  Today, your potassium was low.  We gave you some potassium in a pill at the hospital, and you will need to take 4 more days of this medication.  Please have your potassium rechecked at the end of the week.  Today, you have a cough with fluid in your lungs.  While we did not see signs of new infection in your lungs today, you are at very high risk for this and need to return immediately if you develop worsening cough, fever, shortness of breath or low oxygen levels.  Return to the emergency department for severe pain, lightheadedness or fainting, fever, changes in mental status, inability to keep down fluids, or any other symptoms concerning to you.

## 2019-01-11 DEATH — deceased

## 2019-02-13 ENCOUNTER — Encounter (INDEPENDENT_AMBULATORY_CARE_PROVIDER_SITE_OTHER): Payer: Medicare Other | Admitting: Ophthalmology

## 2019-09-30 IMAGING — US US RENAL
1 series · 14 of 25 positions shown · non-contrast
Comparison: None.

CLINICAL DATA: Acute kidney failure

EXAM:
RENAL / URINARY TRACT ULTRASOUND COMPLETE

[Series 1: us renal · 14 of 34 slices shown]
[im 1/34]
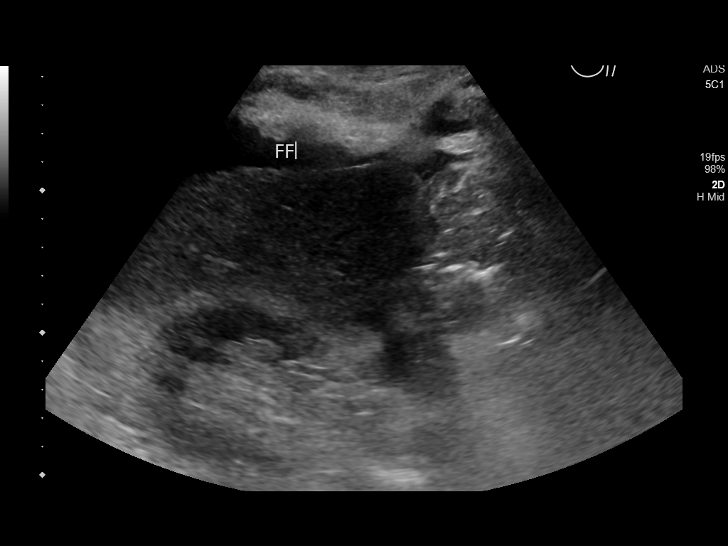
[im 3/34]
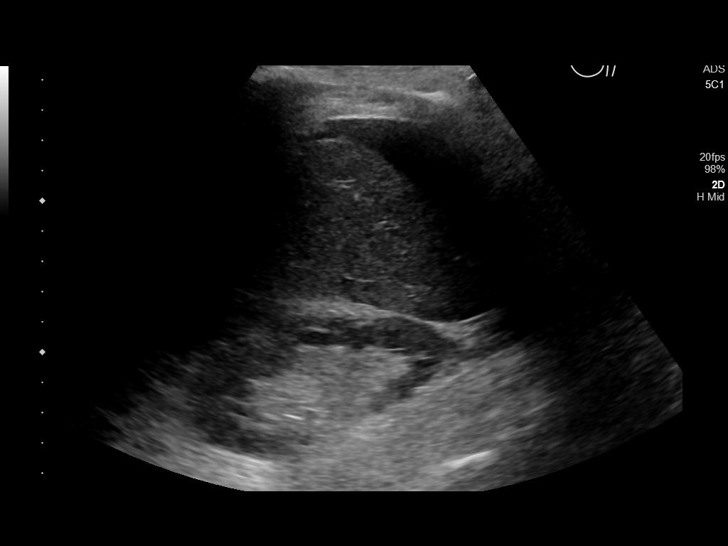
[im 6/34]
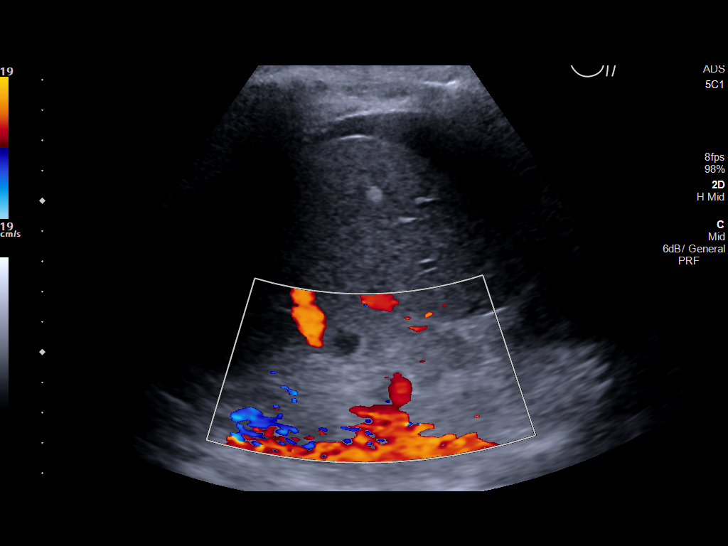
[im 9/34]
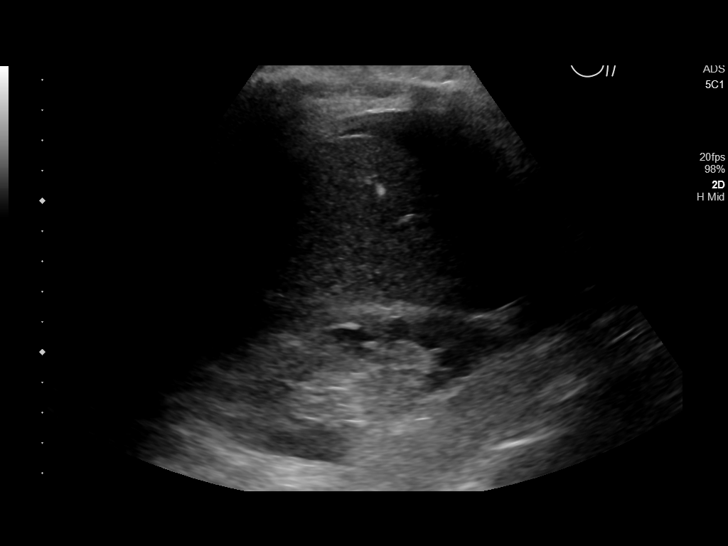
[im 12/34]
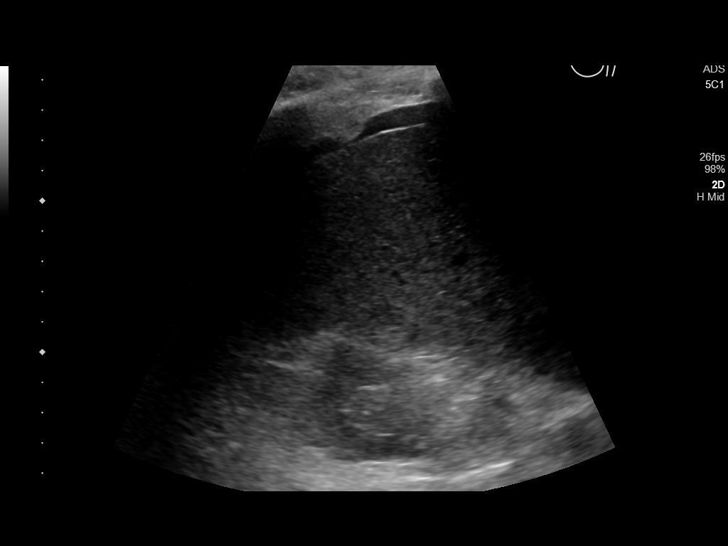
[im 13/34]
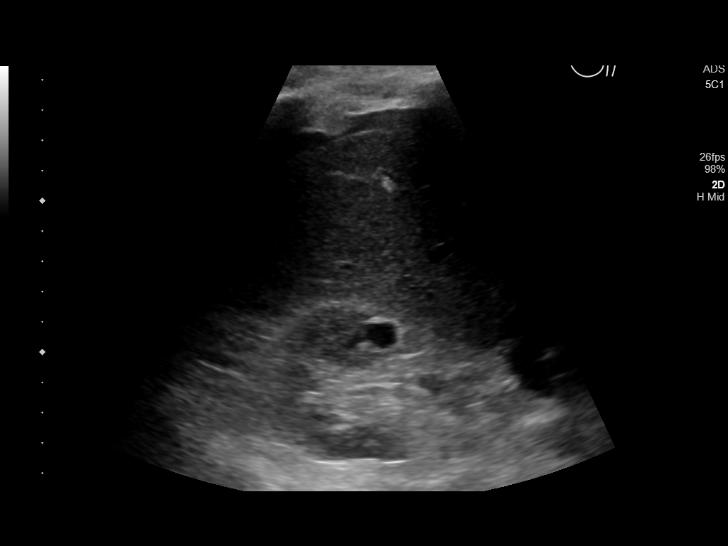
[im 16/34]
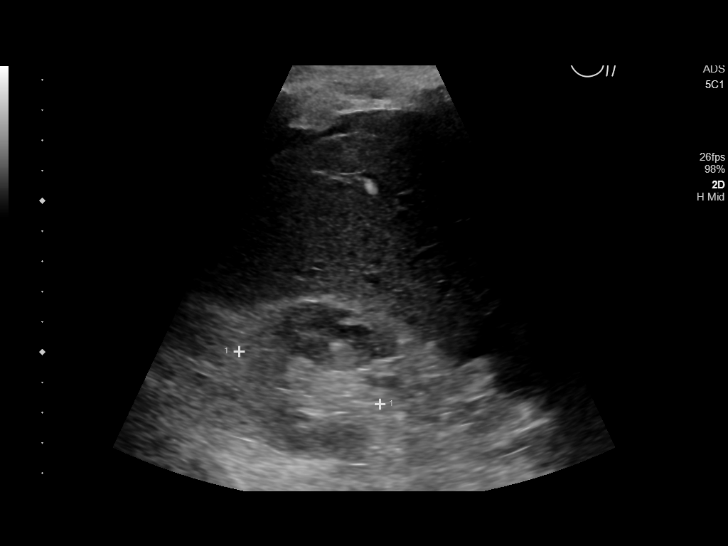
[im 18/34]
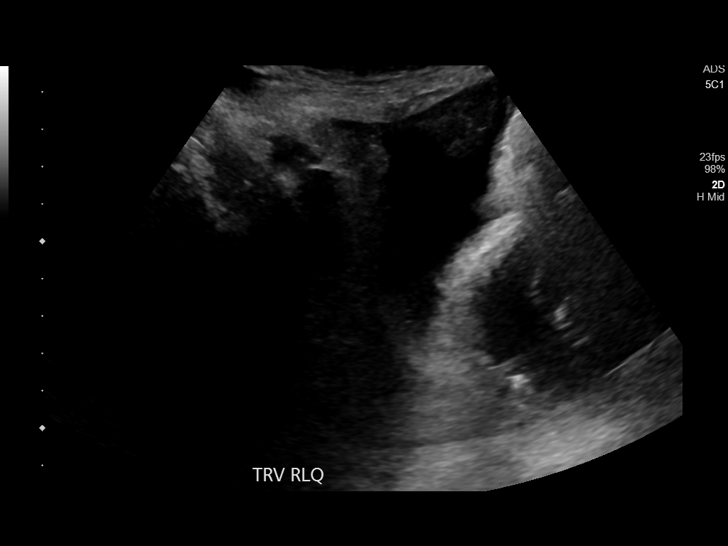
[im 21/34]
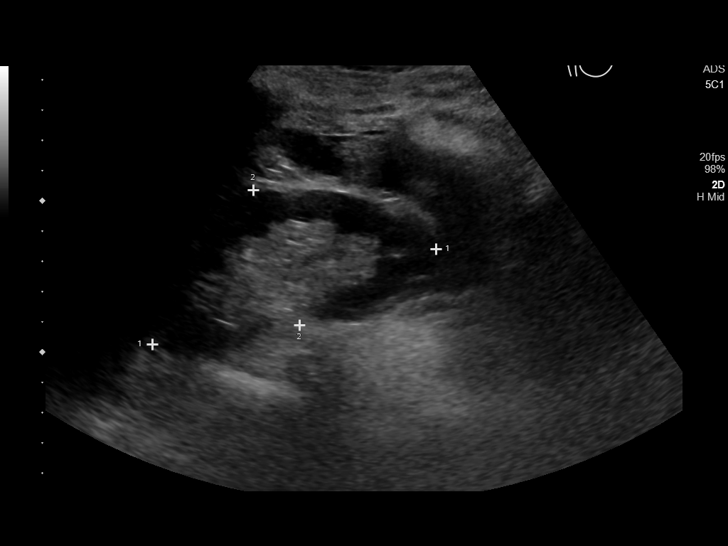
[im 23/34]
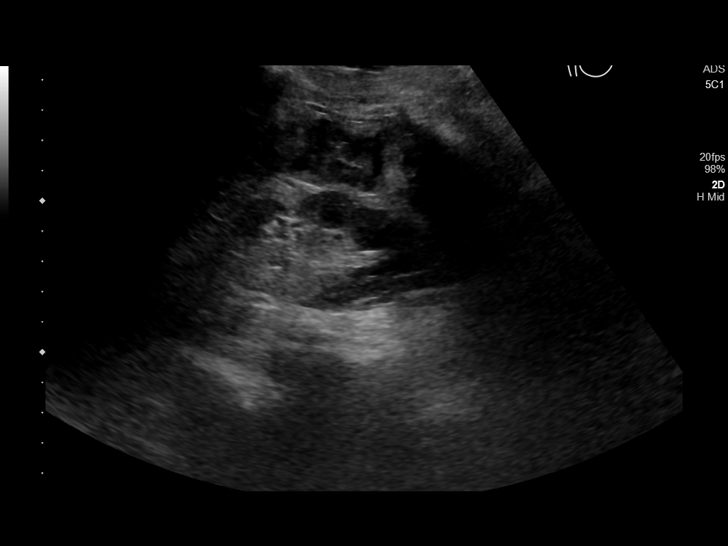
[im 25/34]
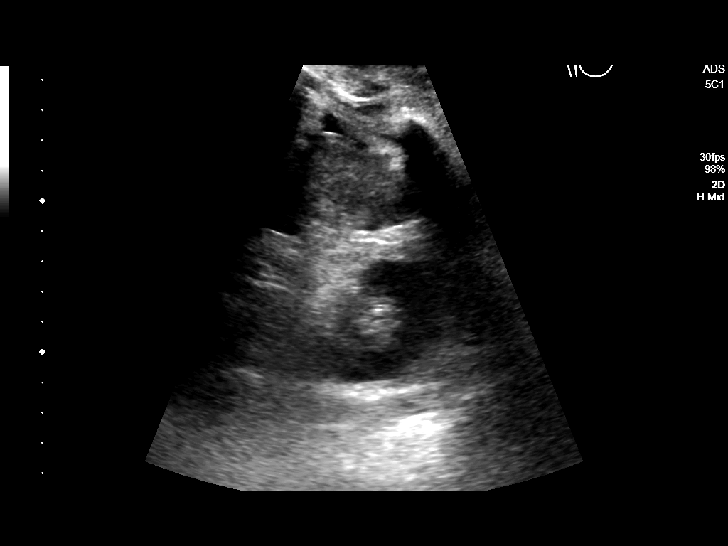
[im 28/34]
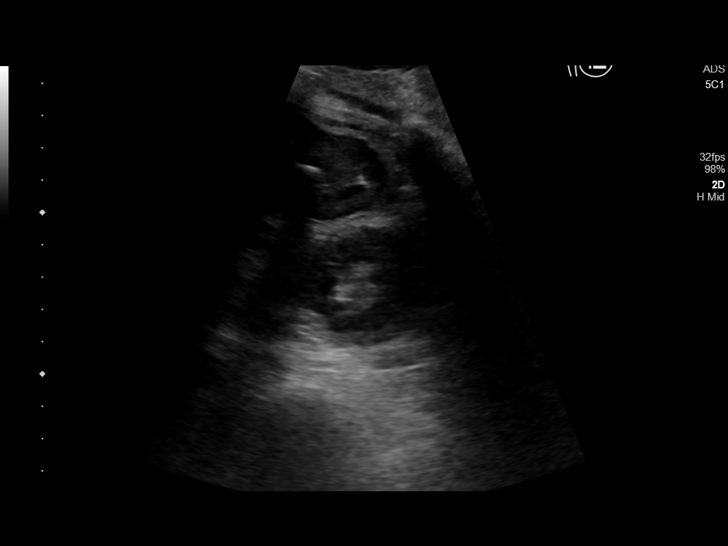
[im 31/34]
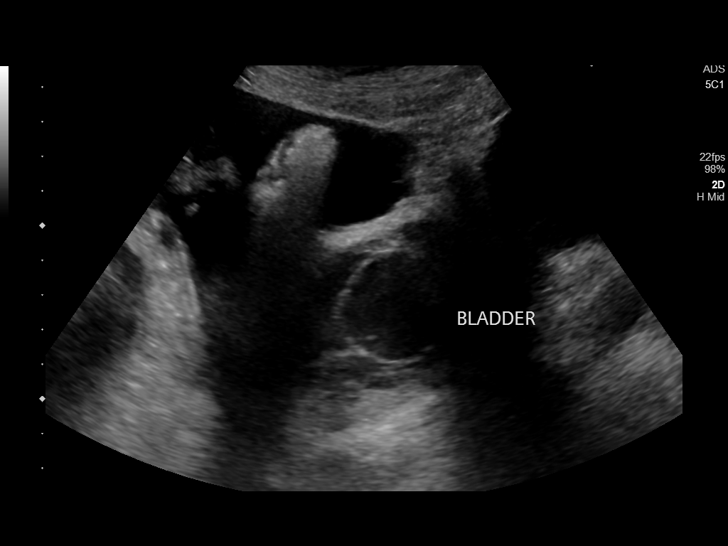
[im 34/34]
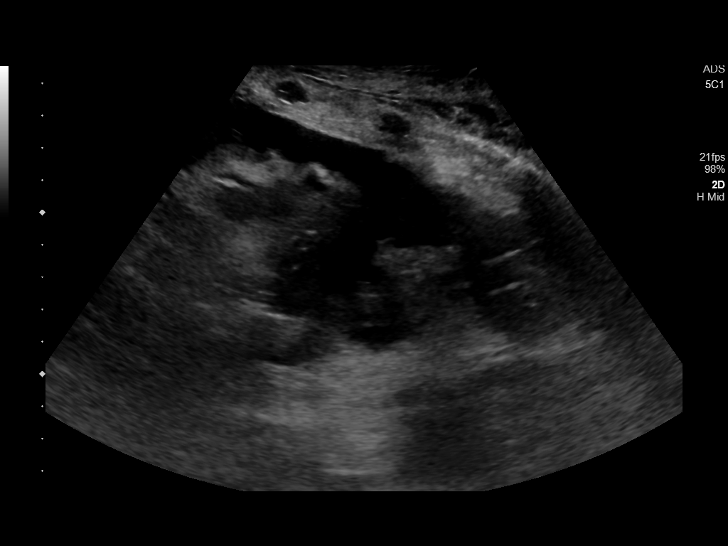

[14 of 25 positions shown; findings below may reference images not displayed]

FINDINGS: Right Kidney:

Renal measurements: 9.1 x 4.3 x 5 cm = volume: 102 mL . Echogenicity
within normal limits. 11 mm cysts within the upper pole. No
suspicious mass or hydronephrosis visualized.

Left Kidney:

Renal measurements: 9.9 x 4.7 x 4.3 cm = volume: 106 mL.
Echogenicity within normal limits. No mass or hydronephrosis
visualized.

Bladder:

Decompressed by a suprapubic catheter.

Incidental note made of ascites within the abdomen and pelvis.
IMPRESSION: 1. No acute or significant renal findings.  No hydronephrosis.
2. Bladder decompressed by a suprapubic catheter.
3. Ascites.
